# Patient Record
Sex: Female | Born: 1995 | Race: White | Hispanic: No | Marital: Single | Smoking: Current some day smoker
Health system: Southern US, Community
[De-identification: ages and names within clinical notes are randomized; demographics above are authoritative.]

## PROBLEM LIST (undated history)

## (undated) ENCOUNTER — Inpatient Hospital Stay: Payer: Self-pay

## (undated) DIAGNOSIS — Z789 Other specified health status: Secondary | ICD-10-CM

## (undated) DIAGNOSIS — K529 Noninfective gastroenteritis and colitis, unspecified: Secondary | ICD-10-CM

## (undated) DIAGNOSIS — Z9889 Other specified postprocedural states: Secondary | ICD-10-CM

## (undated) DIAGNOSIS — G43909 Migraine, unspecified, not intractable, without status migrainosus: Secondary | ICD-10-CM

## (undated) DIAGNOSIS — N809 Endometriosis, unspecified: Secondary | ICD-10-CM

## (undated) DIAGNOSIS — R87629 Unspecified abnormal cytological findings in specimens from vagina: Secondary | ICD-10-CM

## (undated) HISTORY — DX: Unspecified abnormal cytological findings in specimens from vagina: R87.629

## (undated) HISTORY — DX: Other specified postprocedural states: Z98.890

## (undated) HISTORY — DX: Other specified health status: Z78.9

## (undated) HISTORY — DX: Migraine, unspecified, not intractable, without status migrainosus: G43.909

## (undated) HISTORY — PX: WISDOM TOOTH EXTRACTION: SHX21

## (undated) HISTORY — PX: NO PAST SURGERIES: SHX2092

---
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
## 2013-03-07 ENCOUNTER — Emergency Department: Payer: Self-pay | Admitting: Emergency Medicine

## 2013-03-07 LAB — CBC
HCT: 43.1 % (ref 35.0–47.0)
HGB: 14.8 g/dL (ref 12.0–16.0)
MCH: 30.6 pg (ref 26.0–34.0)
MCHC: 34.4 g/dL (ref 32.0–36.0)
MCV: 89 fL (ref 80–100)
Platelet: 198 10*3/uL (ref 150–440)
RBC: 4.84 10*6/uL (ref 3.80–5.20)
RDW: 13 % (ref 11.5–14.5)
WBC: 10.8 10*3/uL (ref 3.6–11.0)

## 2013-03-07 LAB — URINALYSIS, COMPLETE
Bilirubin,UR: NEGATIVE
Glucose,UR: NEGATIVE mg/dL (ref 0–75)
Ketone: NEGATIVE
Nitrite: NEGATIVE
Ph: 6 (ref 4.5–8.0)
Protein: NEGATIVE
RBC,UR: 4 /HPF (ref 0–5)
Specific Gravity: 1.018 (ref 1.003–1.030)
Squamous Epithelial: 27
WBC UR: 65 /HPF (ref 0–5)

## 2013-03-07 LAB — COMPREHENSIVE METABOLIC PANEL
Albumin: 4.1 g/dL (ref 3.8–5.6)
Alkaline Phosphatase: 93 U/L (ref 82–169)
Anion Gap: 6 — ABNORMAL LOW (ref 7–16)
BUN: 7 mg/dL (ref 7–18)
Bilirubin,Total: 0.8 mg/dL (ref 0.2–1.0)
Calcium, Total: 9.3 mg/dL (ref 9.0–10.7)
Chloride: 105 mmol/L (ref 98–107)
Co2: 27 mmol/L (ref 21–32)
Creatinine: 0.54 mg/dL — ABNORMAL LOW (ref 0.60–1.30)
EGFR (African American): >60
EGFR (Non-African Amer.): >60
Glucose: 69 mg/dL (ref 65–99)
Osmolality: 272 (ref 275–301)
Potassium: 3.9 mmol/L (ref 3.5–5.1)
SGOT(AST): 19 U/L (ref 0–26)
SGPT (ALT): 14 U/L (ref 12–78)
Sodium: 138 mmol/L (ref 136–145)
Total Protein: 8.2 g/dL (ref 6.4–8.6)

## 2013-03-07 LAB — WET PREP, GENITAL

## 2013-03-07 LAB — GC/CHLAMYDIA PROBE AMP

## 2013-03-10 LAB — URINE CULTURE

## 2013-04-11 ENCOUNTER — Emergency Department: Payer: Self-pay | Admitting: Internal Medicine

## 2013-10-31 ENCOUNTER — Emergency Department: Payer: Self-pay | Admitting: Emergency Medicine

## 2013-12-05 ENCOUNTER — Emergency Department: Payer: Self-pay

## 2013-12-05 LAB — URINALYSIS, COMPLETE
Bacteria: NONE SEEN
Bilirubin,UR: NEGATIVE
Blood: NEGATIVE
Glucose,UR: NEGATIVE mg/dL (ref 0–75)
Ketone: NEGATIVE
Leukocyte Esterase: NEGATIVE
Nitrite: NEGATIVE
Ph: 6 (ref 4.5–8.0)
Protein: NEGATIVE
RBC,UR: 1 /HPF (ref 0–5)
Specific Gravity: 1.015 (ref 1.003–1.030)
Squamous Epithelial: 6
WBC UR: 1 /HPF (ref 0–5)

## 2013-12-05 LAB — CBC
HCT: 42.1 % (ref 35.0–47.0)
HGB: 14.1 g/dL (ref 12.0–16.0)
MCH: 29.7 pg (ref 26.0–34.0)
MCHC: 33.4 g/dL (ref 32.0–36.0)
MCV: 89 fL (ref 80–100)
Platelet: 168 10*3/uL (ref 150–440)
RBC: 4.73 10*6/uL (ref 3.80–5.20)
RDW: 13.4 % (ref 11.5–14.5)
WBC: 4.6 10*3/uL (ref 3.6–11.0)

## 2013-12-05 LAB — PREGNANCY, URINE: Pregnancy Test, Urine: NEGATIVE m[IU]/mL

## 2013-12-05 LAB — GC/CHLAMYDIA PROBE AMP

## 2013-12-05 LAB — WET PREP, GENITAL

## 2013-12-13 ENCOUNTER — Emergency Department: Payer: Self-pay | Admitting: Emergency Medicine

## 2014-04-25 ENCOUNTER — Emergency Department: Payer: Self-pay | Admitting: Emergency Medicine

## 2014-04-28 LAB — BETA STREP CULTURE(ARMC)

## 2014-07-18 ENCOUNTER — Emergency Department: Payer: Self-pay | Admitting: Emergency Medicine

## 2014-07-18 LAB — URINALYSIS, COMPLETE
Bacteria: NONE SEEN
Bilirubin,UR: NEGATIVE
Blood: NEGATIVE
Glucose,UR: NEGATIVE mg/dL (ref 0–75)
Ketone: NEGATIVE
Nitrite: NEGATIVE
Ph: 7 (ref 4.5–8.0)
Protein: NEGATIVE
RBC,UR: 4 /HPF (ref 0–5)
Specific Gravity: 1.017 (ref 1.003–1.030)
Squamous Epithelial: 48
WBC UR: 13 /HPF (ref 0–5)

## 2014-07-18 LAB — CBC WITH DIFFERENTIAL/PLATELET
Basophil #: 0 10*3/uL (ref 0.0–0.1)
Basophil %: 0.8 %
Eosinophil #: 0.3 10*3/uL (ref 0.0–0.7)
Eosinophil %: 5.2 %
HCT: 41.6 % (ref 35.0–47.0)
HGB: 13.3 g/dL (ref 12.0–16.0)
Lymphocyte #: 1.9 10*3/uL (ref 1.0–3.6)
Lymphocyte %: 37.5 %
MCH: 29.1 pg (ref 26.0–34.0)
MCHC: 32 g/dL (ref 32.0–36.0)
MCV: 91 fL (ref 80–100)
Monocyte #: 0.4 x10 3/mm (ref 0.2–0.9)
Monocyte %: 8.6 %
Neutrophil #: 2.5 10*3/uL (ref 1.4–6.5)
Neutrophil %: 47.9 %
Platelet: 175 10*3/uL (ref 150–440)
RBC: 4.58 10*6/uL (ref 3.80–5.20)
RDW: 13.1 % (ref 11.5–14.5)
WBC: 5.1 10*3/uL (ref 3.6–11.0)

## 2014-07-18 LAB — COMPREHENSIVE METABOLIC PANEL
Albumin: 3.9 g/dL (ref 3.4–5.0)
Alkaline Phosphatase: 62 U/L
Anion Gap: 7 (ref 7–16)
BUN: 7 mg/dL (ref 7–18)
Bilirubin,Total: 0.3 mg/dL (ref 0.2–1.0)
Calcium, Total: 8.4 mg/dL — ABNORMAL LOW (ref 8.5–10.1)
Chloride: 108 mmol/L — ABNORMAL HIGH (ref 98–107)
Co2: 27 mmol/L (ref 21–32)
Creatinine: 0.74 mg/dL (ref 0.60–1.30)
EGFR (African American): >60
EGFR (Non-African Amer.): >60
Glucose: 95 mg/dL (ref 65–99)
Osmolality: 281 (ref 275–301)
Potassium: 3.8 mmol/L (ref 3.5–5.1)
SGOT(AST): 16 U/L (ref 15–37)
SGPT (ALT): 14 U/L
Sodium: 142 mmol/L (ref 136–145)
Total Protein: 6.8 g/dL (ref 6.4–8.2)

## 2014-07-18 LAB — GC/CHLAMYDIA PROBE AMP

## 2014-07-18 LAB — LIPASE, BLOOD: Lipase: 90 U/L (ref 73–393)

## 2014-07-18 LAB — WET PREP, GENITAL

## 2014-12-10 ENCOUNTER — Emergency Department: Payer: Self-pay | Admitting: Emergency Medicine

## 2015-01-27 ENCOUNTER — Emergency Department
Admit: 2015-01-27 | Disposition: A | Discharge status: Home / Self Care | Payer: Self-pay | Admitting: Emergency Medicine

## 2015-01-27 LAB — CBC
HCT: 44 % (ref 35.0–47.0)
HGB: 14.5 g/dL (ref 12.0–16.0)
MCH: 29.3 pg (ref 26.0–34.0)
MCHC: 32.8 g/dL (ref 32.0–36.0)
MCV: 89 fL (ref 80–100)
Platelet: 207 10*3/uL (ref 150–440)
RBC: 4.93 10*6/uL (ref 3.80–5.20)
RDW: 13.2 % (ref 11.5–14.5)
WBC: 5.5 10*3/uL (ref 3.6–11.0)

## 2015-05-15 LAB — HM PAP SMEAR: HM Pap smear: NEGATIVE

## 2015-06-29 ENCOUNTER — Encounter: Payer: Self-pay | Admitting: Emergency Medicine

## 2015-06-29 ENCOUNTER — Emergency Department
Admission: EM | Admit: 2015-06-29 | Discharge: 2015-06-29 | Discharge status: Against Medical Advice | Payer: Medicaid Other | Attending: Emergency Medicine | Admitting: Emergency Medicine

## 2015-06-29 DIAGNOSIS — Z720 Tobacco use: Secondary | ICD-10-CM | POA: Insufficient documentation

## 2015-06-29 DIAGNOSIS — N390 Urinary tract infection, site not specified: Secondary | ICD-10-CM | POA: Insufficient documentation

## 2015-06-29 DIAGNOSIS — M79605 Pain in left leg: Secondary | ICD-10-CM | POA: Diagnosis not present

## 2015-06-29 DIAGNOSIS — R102 Pelvic and perineal pain: Secondary | ICD-10-CM | POA: Diagnosis present

## 2015-06-29 LAB — URINALYSIS COMPLETE WITH MICROSCOPIC (ARMC ONLY)
Bacteria, UA: NONE SEEN
Bilirubin Urine: NEGATIVE
Glucose, UA: NEGATIVE mg/dL
Hgb urine dipstick: NEGATIVE
Leukocytes, UA: NEGATIVE
Nitrite: NEGATIVE
Protein, ur: NEGATIVE mg/dL
Specific Gravity, Urine: 1.003 — ABNORMAL LOW (ref 1.005–1.030)
Squamous Epithelial / LPF: NONE SEEN
pH: 6 (ref 5.0–8.0)

## 2015-06-29 NOTE — ED Notes (Addendum)
Pt presents to ED with pelvic pain. Seen by urgent care yesterday and was dx with a UTI. Pt was prescribed antibiotics with no relief after 2 doses. Pain radiates from lower abd down her left leg.

## 2015-09-25 LAB — OB RESULTS CONSOLE RPR: RPR: NONREACTIVE

## 2015-09-25 LAB — OB RESULTS CONSOLE HIV ANTIBODY (ROUTINE TESTING): HIV: NONREACTIVE

## 2015-09-25 LAB — OB RESULTS CONSOLE RUBELLA ANTIBODY, IGM: Rubella: NON-IMMUNE/NOT IMMUNE

## 2015-09-25 LAB — OB RESULTS CONSOLE HEPATITIS B SURFACE ANTIGEN: Hepatitis B Surface Ag: NEGATIVE

## 2015-09-25 LAB — OB RESULTS CONSOLE VARICELLA ZOSTER ANTIBODY, IGG: Varicella: IMMUNE

## 2015-09-26 ENCOUNTER — Ambulatory Visit
Admission: RE | Admit: 2015-09-26 | Discharge: 2015-09-26 | Disposition: A | Discharge status: Home / Self Care | Payer: Medicaid Other | Source: Ambulatory Visit | Attending: Family Medicine | Admitting: Family Medicine

## 2015-09-26 DIAGNOSIS — Z3A00 Weeks of gestation of pregnancy not specified: Secondary | ICD-10-CM | POA: Diagnosis not present

## 2015-09-26 DIAGNOSIS — O219 Vomiting of pregnancy, unspecified: Secondary | ICD-10-CM | POA: Insufficient documentation

## 2015-09-26 MED ORDER — LACTATED RINGERS IV SOLN
Freq: Once | INTRAVENOUS | Status: AC
  Administered 2015-09-26: 1000 mL via INTRAVENOUS
  Administered 2015-09-26: 10:00:00 via INTRAVENOUS

## 2015-09-26 NOTE — OR Nursing (Signed)
Infusion complete. Patient ambulated to BR and voided moderate amt. Iv discontinued and discharged to home.

## 2015-09-26 NOTE — OR Nursing (Signed)
Patient instructed to follow up with with Dr. Vergie LivingPickens for any problems.

## 2015-10-22 NOTE — L&D Delivery Note (Signed)
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 Artis Flock(Stefanie Sparks) Apgars:8 ,9  Weight:  pending .   Placenta status: spontaneous and Intact.  Cord: 3+ vessels;  with the following complications: none.  Anesthesia:  epidural Episiotomy:  none Lacerations:  none Est. Blood Loss (mL):  less than 100 mL  Mom to postpartum.  Baby to Couplet care / Skin to Skin.

## 2016-04-06 ENCOUNTER — Observation Stay
Admission: EM | Admit: 2016-04-06 | Discharge: 2016-04-06 | Disposition: A | Discharge status: Home / Self Care | Payer: Medicaid Other | Attending: Obstetrics & Gynecology | Admitting: Obstetrics & Gynecology

## 2016-04-06 DIAGNOSIS — O99613 Diseases of the digestive system complicating pregnancy, third trimester: Principal | ICD-10-CM | POA: Insufficient documentation

## 2016-04-06 DIAGNOSIS — K529 Noninfective gastroenteritis and colitis, unspecified: Secondary | ICD-10-CM

## 2016-04-06 DIAGNOSIS — Z3A35 35 weeks gestation of pregnancy: Secondary | ICD-10-CM | POA: Diagnosis not present

## 2016-04-06 DIAGNOSIS — O9989 Other specified diseases and conditions complicating pregnancy, childbirth and the puerperium: Secondary | ICD-10-CM | POA: Diagnosis present

## 2016-04-06 HISTORY — DX: Noninfective gastroenteritis and colitis, unspecified: K52.9

## 2016-04-06 LAB — URINALYSIS COMPLETE WITH MICROSCOPIC (ARMC ONLY)
Bilirubin Urine: NEGATIVE
Glucose, UA: NEGATIVE mg/dL
Hgb urine dipstick: NEGATIVE
Ketones, ur: NEGATIVE mg/dL
Nitrite: NEGATIVE
Protein, ur: NEGATIVE mg/dL
Specific Gravity, Urine: 1.005 (ref 1.005–1.030)
pH: 8 (ref 5.0–8.0)

## 2016-04-06 MED ORDER — ACETAMINOPHEN 325 MG PO TABS: 650.0000 mg | ORAL_TABLET | ORAL | Status: DC | PRN

## 2016-04-06 MED ORDER — ONDANSETRON HCL 4 MG/2ML IJ SOLN
4.0000 mg | Freq: Four times a day (QID) | INTRAMUSCULAR | Status: DC | PRN
  Administered 2016-04-06: 4 mg via INTRAVENOUS
  Filled 2016-04-06: qty 2

## 2016-04-06 MED ORDER — DEXTROSE 5 % IN LACTATED RINGERS IV BOLUS
1000.0000 mL | Freq: Once | INTRAVENOUS | Status: AC
  Administered 2016-04-06: 1000 mL via INTRAVENOUS

## 2016-04-06 MED ORDER — DEXTROSE IN LACTATED RINGERS 5 % IV SOLN
INTRAVENOUS | Status: DC
  Administered 2016-04-06: 15:00:00 via INTRAVENOUS

## 2016-04-06 MED ORDER — LOPERAMIDE HCL 2 MG PO CAPS
2.0000 mg | ORAL_CAPSULE | ORAL | Status: DC | PRN
  Administered 2016-04-06: 2 mg via ORAL
  Filled 2016-04-06: qty 1

## 2016-04-06 NOTE — Discharge Instructions (Signed)

## 2016-04-06 NOTE — OB Triage Note (Signed)
Ms. Andrey CampanileWilson here with c/o back pain that started at 0400, continues with pelvic pain and back/hip pain, abdomen soft to palpation, + fetal movement, denies bleeding, LOF. Pt also c/o nausea, vomiting >10x since 0400, reports 4x diarrhea since 0800.

## 2016-04-07 NOTE — Discharge Summary (Addendum)
Physician Discharge Summary  Patient ID: Stefanie Sparks MRN: [id number] DOB/AGE: Oct 24, 1995 20 y.o.  Admit date: 04/06/2016 Discharge date: 04/07/2016  Admission Diagnoses: Nausea, Diarrhea, Pregnant  Discharge Diagnoses:  Active Problems:   Gastroenteritis  Discharged Condition: good  Hospital Course: IVF Hydration and Fetal Monitoring  Consults: None  Significant Diagnostic Studies: A NST procedure was performed with FHR monitoring and a normal baseline established, appropriate time of 20-40 minutes of evaluation, and accels >2 seen w 15x15 characteristics.  Results show a REACTIVE NST.   Treatments: IV hydration  Discharge Exam: Blood pressure 116/66, pulse 95, temperature 98 F (36.7 C), temperature source Oral, resp. rate 16, height 4\' 11"  (1.499 m), weight 57.153 kg (126 lb), last menstrual period 08/03/2015. Triage nurse patient  Disposition: 01-Home or Self Care     Medication List    ASK your doctor about these medications        ondansetron 4 MG tablet  Commonly known as:  ZOFRAN  Take 4 mg by mouth 1 day or 1 dose.     PRENATAL VITAMIN PO  Take by mouth.           Follow-up Information    [facility].   Why:  for scheduled appointment      Signed: Letitia Libraobert Paul Zarya Lasseigne 04/07/2016, 1:45 AM

## 2016-04-11 LAB — OB RESULTS CONSOLE GBS: GBS: NEGATIVE

## 2016-04-11 LAB — OB RESULTS CONSOLE GC/CHLAMYDIA
Chlamydia: NEGATIVE
Gonorrhea: NEGATIVE

## 2016-04-20 ENCOUNTER — Observation Stay
Admission: EM | Admit: 2016-04-20 | Discharge: 2016-04-20 | Disposition: A | Discharge status: Home / Self Care | Payer: Medicaid Other | Attending: Obstetrics & Gynecology | Admitting: Obstetrics & Gynecology

## 2016-04-20 ENCOUNTER — Encounter: Payer: Self-pay | Admitting: *Deleted

## 2016-04-20 DIAGNOSIS — Z3A37 37 weeks gestation of pregnancy: Secondary | ICD-10-CM | POA: Insufficient documentation

## 2016-04-20 DIAGNOSIS — O471 False labor at or after 37 completed weeks of gestation: Principal | ICD-10-CM | POA: Insufficient documentation

## 2016-04-20 NOTE — Final Progress Note (Signed)
Physician Final Progress Note  Patient ID: Stefanie DarnerJustice T Lamar MRN: [id number] DOB/AGE: 571-13-1997 20 y.o.  Admit date: 04/20/2016 Admitting provider: Nadara Mustardobert P Harris, MD Discharge date: 04/20/2016   Admission Diagnoses: IUP at 37 wk 2 d Contractions  Discharge Diagnoses:  Same Consults: none  Significant Findings/ Diagnostic Studies: 20 year old G2 P1001 with EDC=05/09/2016 presented this Am at 37 wk 2d with with irregular contractions. She was monitored/ observed for 6 hours over which time the contractions spaced out and she was able to sleep. Her cervix remained unchanged at 3-4 cm/60-70%/ -1 to -2. FHR was reactive with baseline 120s and accelerations to 150s. GBS negative. Having no leakage of fluid or bleeding. Reviewed prenatal records. Patient discharged home with labor precautions.   Procedures: none  Discharge Condition: stable  Disposition: home Diet: Regular diet  Discharge Activity: Activity as tolerated  [facility] at 1430      Total time spent taking care of this patient: 15 minutes  Signed: Farrel ConnersGUTIERREZ, Lajoyce Tamura 04/20/2016, 8:33 AM

## 2016-04-22 ENCOUNTER — Observation Stay
Admission: EM | Admit: 2016-04-22 | Discharge: 2016-04-22 | Disposition: A | Discharge status: Home / Self Care | Payer: Medicaid Other | Source: Home / Self Care | Admitting: Obstetrics & Gynecology

## 2016-04-22 DIAGNOSIS — O471 False labor at or after 37 completed weeks of gestation: Secondary | ICD-10-CM | POA: Insufficient documentation

## 2016-04-22 DIAGNOSIS — F17210 Nicotine dependence, cigarettes, uncomplicated: Secondary | ICD-10-CM

## 2016-04-22 DIAGNOSIS — Z3A37 37 weeks gestation of pregnancy: Secondary | ICD-10-CM | POA: Insufficient documentation

## 2016-04-22 DIAGNOSIS — O99333 Smoking (tobacco) complicating pregnancy, third trimester: Secondary | ICD-10-CM | POA: Insufficient documentation

## 2016-04-22 NOTE — OB Triage Note (Signed)
Pt arrived to OBS rm 4 with c/o possible SROM at 1330. Pt placed on monitors and oriented to room.

## 2016-04-22 NOTE — Discharge Instructions (Signed)
Return to hospital for decreased fetal movement, leaking of fluid, vaginal bleeding, or painful contractions.

## 2016-04-22 NOTE — Discharge Summary (Signed)
Obstetric History and Physical  Stefanie Sparks is a 20 y.o. G2P1001 with Estimated Date of Delivery: 05/09/16 who presents at 7263w4d  presenting for leaking fluid and ctx. No vaginal bleeding, with active fetal movement.    Prenatal Course Source of Care: [facility]   Pregnancy complications or risks: Patient Active Problem List   Diagnosis Date Noted  . Labor and delivery, indication for care 04/22/2016    Prenatal Transfer Tool   History reviewed. No pertinent past medical history.  Past Surgical History  Procedure Laterality Date  . No past surgeries      OB History  Gravida Para Term Preterm AB SAB TAB Ectopic Multiple Living  2 1 1       1     # Outcome Date GA Lbr Len/2nd Weight Sex Delivery Anes PTL Lv  2 Current           1 Term 11/03/10 2565w0d   F Vag-Spont None  Y      Social History   Social History  . Marital Status: Single    Spouse Name: N/A  . Number of Children: N/A  . Years of Education: N/A   Social History Main Topics  . Smoking status: Current Every Day Smoker -- 1.00 packs/day    Types: Cigarettes  . Smokeless tobacco: Never Used  . Alcohol Use: No  . Drug Use: No  . Sexual Activity: Not Asked   Other Topics Concern  . None   Social History Narrative    History reviewed. No pertinent family history.  Prescriptions prior to admission  Medication Sig Dispense Refill Last Dose  . ondansetron (ZOFRAN) 4 MG tablet Take 4 mg by mouth 1 day or 1 dose.   04/21/2016 at Unknown time  . Prenatal Vit-Fe Fumarate-FA (PRENATAL VITAMIN PO) Take by mouth.   04/21/2016 at Unknown time    No Known Allergies  Review of Systems: Negative except for what is mentioned in HPI.  Physical Exam: BP 104/66 mmHg  Pulse 85  Temp(Src) 98.1 F (36.7 C) (Oral)  LMP 08/03/2015 (Approximate) GENERAL: Well-developed, well-nourished female in no acute distress.  ABDOMEN: Soft, nontender, nondistended, gravid. EXTREMITIES: Nontender, no edema Cervical Exam:3-4/90/-1,  unchanged except -2 station on re-exam, nitrizine negative, no evidence of ROM Presentation: cephalic FHT: Category: 1 Baseline rate 130 bpm   Variability moderate  Accelerations present   Decelerations none Contractions: every 2-5 min, pt reports q 6-10 min   Pertinent Labs/Studies:   No results found for this or any previous visit (from the past 24 hour(s)).  Assessment : IUP at 5463w4d not in labor  Plan: Discharge Home  Labor precautions

## 2016-04-22 NOTE — OB Triage Note (Signed)
Marja Kays. Brothers, CNM at bedside for discharge instructions. Pt. Verbalized understanding. All questions answered. Labor precautions given.

## 2016-04-23 ENCOUNTER — Inpatient Hospital Stay: Payer: Medicaid Other | Admitting: Anesthesiology

## 2016-04-23 ENCOUNTER — Inpatient Hospital Stay
Admission: EM | Admit: 2016-04-23 | Discharge: 2016-04-25 | DRG: 775 | Disposition: A | Discharge status: Home / Self Care | Payer: Medicaid Other | Attending: Obstetrics and Gynecology | Admitting: Obstetrics and Gynecology

## 2016-04-23 ENCOUNTER — Encounter: Payer: Self-pay | Admitting: *Deleted

## 2016-04-23 DIAGNOSIS — O99334 Smoking (tobacco) complicating childbirth: Secondary | ICD-10-CM | POA: Diagnosis present

## 2016-04-23 DIAGNOSIS — F1290 Cannabis use, unspecified, uncomplicated: Secondary | ICD-10-CM | POA: Diagnosis present

## 2016-04-23 DIAGNOSIS — Z3A37 37 weeks gestation of pregnancy: Secondary | ICD-10-CM | POA: Diagnosis not present

## 2016-04-23 DIAGNOSIS — O99324 Drug use complicating childbirth: Principal | ICD-10-CM | POA: Diagnosis present

## 2016-04-23 DIAGNOSIS — Z23 Encounter for immunization: Secondary | ICD-10-CM | POA: Diagnosis not present

## 2016-04-23 LAB — TYPE AND SCREEN
ABO/RH(D): O POS
Antibody Screen: NEGATIVE

## 2016-04-23 LAB — CBC
HCT: 33.6 % — ABNORMAL LOW (ref 35.0–47.0)
Hemoglobin: 11.3 g/dL — ABNORMAL LOW (ref 12.0–16.0)
MCH: 29 pg (ref 26.0–34.0)
MCHC: 33.7 g/dL (ref 32.0–36.0)
MCV: 85.9 fL (ref 80.0–100.0)
Platelets: 211 10*3/uL (ref 150–440)
RBC: 3.91 MIL/uL (ref 3.80–5.20)
RDW: 12.8 % (ref 11.5–14.5)
WBC: 13.1 10*3/uL — ABNORMAL HIGH (ref 3.6–11.0)

## 2016-04-23 LAB — RAPID HIV SCREEN (HIV 1/2 AB+AG)
HIV 1/2 Antibodies: NONREACTIVE
HIV-1 P24 Antigen - HIV24: NONREACTIVE

## 2016-04-23 LAB — URINE DRUG SCREEN, QUALITATIVE (ARMC ONLY)
Amphetamines, Ur Screen: NOT DETECTED
Barbiturates, Ur Screen: NOT DETECTED
Benzodiazepine, Ur Scrn: NOT DETECTED
Cannabinoid 50 Ng, Ur ~~LOC~~: POSITIVE — AB
Cocaine Metabolite,Ur ~~LOC~~: NOT DETECTED
MDMA (Ecstasy)Ur Screen: NOT DETECTED
Methadone Scn, Ur: NOT DETECTED
Opiate, Ur Screen: NOT DETECTED
Phencyclidine (PCP) Ur S: NOT DETECTED
Tricyclic, Ur Screen: NOT DETECTED

## 2016-04-23 MED ORDER — NALBUPHINE HCL 10 MG/ML IJ SOLN: 5.0000 mg | Freq: Once | INTRAMUSCULAR | Status: DC | PRN

## 2016-04-23 MED ORDER — TERBUTALINE SULFATE 1 MG/ML IJ SOLN: 0.2500 mg | Freq: Once | INTRAMUSCULAR | Status: DC | PRN

## 2016-04-23 MED ORDER — OXYTOCIN 40 UNITS IN LACTATED RINGERS INFUSION - SIMPLE MED
1.0000 m[IU]/min | INTRAVENOUS | Status: DC
  Administered 2016-04-23: 1 m[IU]/min via INTRAVENOUS

## 2016-04-23 MED ORDER — OXYTOCIN BOLUS FROM INFUSION: 500.0000 mL | INTRAVENOUS | Status: DC

## 2016-04-23 MED ORDER — BUTORPHANOL TARTRATE 1 MG/ML IJ SOLN: 1.0000 mg | INTRAMUSCULAR | Status: DC | PRN

## 2016-04-23 MED ORDER — LIDOCAINE HCL (PF) 1 % IJ SOLN
INTRAMUSCULAR | Status: AC
  Administered 2016-04-23: 3 mL via SUBCUTANEOUS
  Filled 2016-04-23: qty 30

## 2016-04-23 MED ORDER — OXYTOCIN 40 UNITS IN LACTATED RINGERS INFUSION - SIMPLE MED: 2.5000 [IU]/h | INTRAVENOUS | Status: DC

## 2016-04-23 MED ORDER — OXYTOCIN 10 UNIT/ML IJ SOLN
INTRAMUSCULAR | Status: AC
  Filled 2016-04-23: qty 2

## 2016-04-23 MED ORDER — EPHEDRINE 5 MG/ML INJ
10.0000 mg | Freq: Once | INTRAVENOUS | Status: AC
  Administered 2016-04-24: 10 mg via INTRAVENOUS

## 2016-04-23 MED ORDER — DIPHENHYDRAMINE HCL 25 MG PO CAPS: 25.0000 mg | ORAL_CAPSULE | ORAL | Status: DC | PRN

## 2016-04-23 MED ORDER — AMMONIA AROMATIC IN INHA
RESPIRATORY_TRACT | Status: AC
  Filled 2016-04-23: qty 10

## 2016-04-23 MED ORDER — LIDOCAINE-EPINEPHRINE (PF) 1.5 %-1:200000 IJ SOLN
INTRAMUSCULAR | Status: DC | PRN
  Administered 2016-04-23: 4 mL via EPIDURAL

## 2016-04-23 MED ORDER — MISOPROSTOL 200 MCG PO TABS
ORAL_TABLET | ORAL | Status: AC
  Filled 2016-04-23: qty 4

## 2016-04-23 MED ORDER — NALBUPHINE HCL 10 MG/ML IJ SOLN: 5.0000 mg | INTRAMUSCULAR | Status: DC | PRN

## 2016-04-23 MED ORDER — DIPHENHYDRAMINE HCL 50 MG/ML IJ SOLN: 12.5000 mg | INTRAMUSCULAR | Status: DC | PRN

## 2016-04-23 MED ORDER — SODIUM CHLORIDE 0.9% FLUSH: 3.0000 mL | INTRAVENOUS | Status: DC | PRN

## 2016-04-23 MED ORDER — ONDANSETRON HCL 4 MG/2ML IJ SOLN
4.0000 mg | Freq: Four times a day (QID) | INTRAMUSCULAR | Status: DC | PRN
  Administered 2016-04-24: 4 mg via INTRAVENOUS
  Filled 2016-04-23: qty 2

## 2016-04-23 MED ORDER — FENTANYL 2.5 MCG/ML W/ROPIVACAINE 0.2% IN NS 100 ML EPIDURAL INFUSION (ARMC-ANES)
EPIDURAL | Status: AC
  Administered 2016-04-23: 10 mL/h via EPIDURAL
  Filled 2016-04-23: qty 100

## 2016-04-23 MED ORDER — OXYTOCIN 40 UNITS IN LACTATED RINGERS INFUSION - SIMPLE MED
INTRAVENOUS | Status: AC
  Administered 2016-04-23: 1 m[IU]/min via INTRAVENOUS
  Filled 2016-04-23: qty 1000

## 2016-04-23 MED ORDER — NALOXONE HCL 2 MG/2ML IJ SOSY
1.0000 ug/kg/h | PREFILLED_SYRINGE | INTRAVENOUS | Status: DC | PRN
  Filled 2016-04-23: qty 2

## 2016-04-23 MED ORDER — LACTATED RINGERS IV SOLN: 500.0000 mL | INTRAVENOUS | Status: DC | PRN

## 2016-04-23 MED ORDER — ACETAMINOPHEN 325 MG PO TABS
650.0000 mg | ORAL_TABLET | ORAL | Status: DC | PRN
  Administered 2016-04-23: 650 mg via ORAL
  Filled 2016-04-23: qty 2

## 2016-04-23 MED ORDER — FENTANYL 2.5 MCG/ML W/ROPIVACAINE 0.2% IN NS 100 ML EPIDURAL INFUSION (ARMC-ANES)
10.0000 mL/h | EPIDURAL | Status: DC
Start: 2016-04-23 — End: 2016-04-24

## 2016-04-23 MED ORDER — BUPIVACAINE HCL (PF) 0.25 % IJ SOLN
INTRAMUSCULAR | Status: DC | PRN
  Administered 2016-04-23: 5 mL via EPIDURAL

## 2016-04-23 MED ORDER — NALOXONE HCL 0.4 MG/ML IJ SOLN: 0.4000 mg | INTRAMUSCULAR | Status: DC | PRN

## 2016-04-23 MED ORDER — LACTATED RINGERS IV SOLN
INTRAVENOUS | Status: DC
  Administered 2016-04-23: 125 mL/h via INTRAVENOUS
  Administered 2016-04-24: 07:00:00 via INTRAVENOUS

## 2016-04-23 NOTE — Progress Notes (Signed)
Status report given, pt. Rates contractions 6/10, ctx q 2-4 min, cat. 1 fetal tracing. Stable pt. requiring no pain medication at this time.

## 2016-04-23 NOTE — Progress Notes (Signed)
  Labor Progress Note   20 y.o. G2P1001 @ 5055w5d , admitted for  Pregnancy, Labor Management.   Subjective:  Min pain.  Objective:  BP 112/67 mmHg  Pulse 82  Temp(Src) 98.5 F (36.9 C) (Axillary)  Resp 18  Ht 4\' 11"  (1.499 m)  Wt 60.328 kg (133 lb)  BMI 26.85 kg/m2  LMP 08/03/2015 (Approximate) Abd: mild Extr: trace to 1+ bilateral pedal edema SVE: CERVIX: 4 cm dilated, 70 effaced, ballotable station  EFM: FHR: 140 bpm, variability: moderate,  accelerations:  Present,  decelerations:  Absent Toco: Frequency: Every 5-7 minutes  Assessment & Plan:  G2P1001 @ 3055w5d, admitted for  Pregnancy and Labor/Delivery Management  1. Pain management: none. 2. FWB: FHT category 1.  3. ID: GBS negative 4. Labor management: Start Pitocin as she has been 6 hours since SROM without significant cervical change or good ctx pattern  All discussed with patient, see orders

## 2016-04-23 NOTE — Anesthesia Procedure Notes (Signed)
Epidural Patient location during procedure: OB  Preanesthetic Checklist Completed: patient identified, site marked, surgical consent, pre-op evaluation, timeout performed, IV checked, risks and benefits discussed and monitors and equipment checked  Epidural Patient position: sitting Prep: Betadine Patient monitoring: heart rate, continuous pulse ox and blood pressure Approach: midline Location: L4-L5 Injection technique: LOR air  Needle:  Needle type: Tuohy  Needle gauge: 17 G Needle length: 9 cm and 9 Needle insertion depth: 4 cm Catheter type: closed end flexible Catheter size: 20 Guage Catheter at skin depth: 10 cm Test dose: negative and 1.5% lidocaine with Epi 1:200 K  Assessment Sensory level: T10 Events: blood not aspirated, injection not painful, no injection resistance, negative IV test and no paresthesia  Additional Notes Pt's history reviewed and consent obtained as per OB consent Patient tolerated the insertion well without complications. Negative SATD, negative IVTD All VSS were obtained and monitored through OBIX and nursing protocols followed.Reason for block:procedure for pain

## 2016-04-23 NOTE — H&P (Signed)
Obstetrics Admission History & Physical   Rupture of Membranes   HPI:  20 y.o. G2P1001 @ 467w5d (05/09/2016, by Last Menstrual Period). Admitted on 04/23/2016:   Patient Active Problem List   Diagnosis Date Noted  . Labor and delivery, indication for care 04/22/2016  . Indication for care in labor and delivery, delivered 04/20/2016  . Gastroenteritis 04/06/2016     Presents for SROM at 1400 today with mild ctxs before and worsening since that time.  No VB.  Good FM.  [facility]  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 Prior NSVD without complication.  PMHx: History reviewed. No pertinent past medical history. PSHx: None Medications:  Prescriptions prior to admission  Medication Sig Dispense Refill Last Dose  . ondansetron (ZOFRAN) 4 MG tablet Take 4 mg by mouth 1 day or 1 dose.   Past Month  . Prenatal Vit-Fe Fumarate-FA (PRENATAL VITAMIN PO) Take by mouth.   04/22/2016  Allergies: has No Known Allergies. OBHx:  OB History  Gravida Para Term Preterm AB SAB TAB Ectopic Multiple Living  2 1 1       1     # Outcome Date GA Lbr Len/2nd Weight Sex Delivery Anes PTL Lv  2 Current           1 Term 11/03/10 6759w0d   F Vag-Spont None  Y    ZOX:[id number]:Negative/unremarkable except as detailed in HPI. Soc Hx: Pregnancy welcomed  Objective:   Filed Vitals:   04/23/16 1521  BP: 104/75  Pulse: 108  Temp: 98.8 F (37.1 C)  Resp: 18   General: Well nourished, well developed female in no acute distress.  Skin: Warm and dry.  Cardiovascular:Regular rate and rhythm. Respiratory: Clear to auscultation bilateral. Normal respiratory effort Abdomen: moderate Neuro/Psych: Normal mood and affect.   Pelvic exam: is not limited by body habitus EGBUS: within normal limits Vagina: within normal limits and with normal mucosa blood in the vault Cervix: 4/80/-2 Uterus: Spontaneous uterine activity  Adnexa: not evaluated  EFM:FHR: 140 bpm, variability:  moderate,  accelerations:  Present,  decelerations:  Absent Toco: Frequency: Every 5-7 minutes   Perinatal info:  Blood type: O positive Rubella- Not immune Varicella -Immune TDaP Given during third trimester of this pregnancy RPR NR / HIV Neg/ HBsAg Neg   Assessment & Plan:   20 y.o. G2P1001 @ 4867w5d, Admitted on 04/23/2016: SROM, Labor  Admit for labor, Observe for cervical change, Fetal Wellbeing Reassuring and Epidural when ready

## 2016-04-23 NOTE — Anesthesia Preprocedure Evaluation (Signed)
Anesthesia Evaluation  Patient identified by MRN, date of birth, ID band Patient awake    Reviewed: Allergy & Precautions, H&P , NPO status , Patient's Chart, lab work & pertinent test results, reviewed documented beta blocker date and time   Airway Mallampati: III  TM Distance: >3 FB Neck ROM: full    Dental no notable dental hx. (+) Teeth Intact   Pulmonary neg pulmonary ROS, Current Smoker,    Pulmonary exam normal breath sounds clear to auscultation       Cardiovascular Exercise Tolerance: Good negative cardio ROS Normal cardiovascular exam Rhythm:regular Rate:Normal     Neuro/Psych negative neurological ROS  negative psych ROS   GI/Hepatic negative GI ROS, Neg liver ROS,   Endo/Other  negative endocrine ROS  Renal/GU negative Renal ROS  negative genitourinary   Musculoskeletal   Abdominal   Peds  Hematology negative hematology ROS (+)   Anesthesia Other Findings   Reproductive/Obstetrics (+) Pregnancy                             Anesthesia Physical Anesthesia Plan  ASA: II  Anesthesia Plan: Regional and Epidural   Post-op Pain Management:    Induction:   Airway Management Planned:   Additional Equipment:   Intra-op Plan:   Post-operative Plan:   Informed Consent: I have reviewed the patients History and Physical, chart, labs and discussed the procedure including the risks, benefits and alternatives for the proposed anesthesia with the patient or authorized representative who has indicated his/her understanding and acceptance.     Plan Discussed with: CRNA  Anesthesia Plan Comments:         Anesthesia Quick Evaluation

## 2016-04-23 NOTE — Progress Notes (Signed)
RN at the bedside, maternal pulse tracing intermittently, patient sitting in HF with legs crossed, eating clear liquid diet. Once pt. slightly reclines, fetal heart rate re-appears (120s).

## 2016-04-23 NOTE — Progress Notes (Signed)
[facility].  Occasionally when pt. sitting in HF position with legs crossed;  fetal tracing picks up maternal heart rate. FHR 130s - 140s, maternal heart rate 90s.   RN will education patient and continue to monitor. Stable mother and baby.

## 2016-04-23 NOTE — Progress Notes (Signed)
Pt. Present with possible SROM at 2:00 p.m. Today, clear fluid.  Nitrazine test positive, grossly ruptured upon vaginal exam. Dr. Tiburcio PeaHarris paged.

## 2016-04-24 ENCOUNTER — Encounter: Payer: Self-pay | Admitting: *Deleted

## 2016-04-24 MED ORDER — COCONUT OIL OIL: 1.0000 "application " | TOPICAL_OIL | Status: DC | PRN

## 2016-04-24 MED ORDER — IBUPROFEN 600 MG PO TABS
600.0000 mg | ORAL_TABLET | Freq: Four times a day (QID) | ORAL | Status: DC
  Administered 2016-04-24 – 2016-04-25 (×4): 600 mg via ORAL
  Filled 2016-04-24 (×4): qty 1

## 2016-04-24 MED ORDER — ONDANSETRON HCL 4 MG/2ML IJ SOLN: 4.0000 mg | INTRAMUSCULAR | Status: DC | PRN

## 2016-04-24 MED ORDER — BENZOCAINE-MENTHOL 20-0.5 % EX AERO: 1.0000 "application " | INHALATION_SPRAY | CUTANEOUS | Status: DC | PRN

## 2016-04-24 MED ORDER — WITCH HAZEL-GLYCERIN EX PADS
1.0000 | MEDICATED_PAD | CUTANEOUS | Status: DC | PRN
Start: 2016-04-24 — End: 2016-04-25

## 2016-04-24 MED ORDER — ONDANSETRON HCL 4 MG PO TABS
4.0000 mg | ORAL_TABLET | ORAL | Status: DC | PRN
Start: 2016-04-24 — End: 2016-04-25

## 2016-04-24 MED ORDER — EPHEDRINE 5 MG/ML INJ
INTRAVENOUS | Status: AC
  Administered 2016-04-24: 10 mg via INTRAVENOUS
  Filled 2016-04-24: qty 10

## 2016-04-24 MED ORDER — DIBUCAINE 1 % RE OINT: 1.0000 "application " | TOPICAL_OINTMENT | RECTAL | Status: DC | PRN

## 2016-04-24 MED ORDER — SODIUM CHLORIDE 0.9% FLUSH: 3.0000 mL | INTRAVENOUS | Status: DC | PRN

## 2016-04-24 MED ORDER — EPHEDRINE 5 MG/ML INJ
10.0000 mg | Freq: Once | INTRAVENOUS | Status: AC
  Administered 2016-04-24: 10 mg via INTRAVENOUS

## 2016-04-24 MED ORDER — SIMETHICONE 80 MG PO CHEW: 80.0000 mg | CHEWABLE_TABLET | ORAL | Status: DC | PRN

## 2016-04-24 MED ORDER — DIPHENHYDRAMINE HCL 25 MG PO CAPS: 25.0000 mg | ORAL_CAPSULE | Freq: Four times a day (QID) | ORAL | Status: DC | PRN

## 2016-04-24 MED ORDER — ZOLPIDEM TARTRATE 5 MG PO TABS: 5.0000 mg | ORAL_TABLET | Freq: Every evening | ORAL | Status: DC | PRN

## 2016-04-24 MED ORDER — MEASLES, MUMPS & RUBELLA VAC ~~LOC~~ INJ
0.5000 mL | INJECTION | Freq: Once | SUBCUTANEOUS | Status: AC
  Administered 2016-04-25: 0.5 mL via SUBCUTANEOUS
  Filled 2016-04-24 (×2): qty 0.5

## 2016-04-24 MED ORDER — SODIUM CHLORIDE 0.9 % IV SOLN: 250.0000 mL | INTRAVENOUS | Status: DC | PRN

## 2016-04-24 MED ORDER — SODIUM CHLORIDE 0.9% FLUSH: 3.0000 mL | Freq: Two times a day (BID) | INTRAVENOUS | Status: DC

## 2016-04-24 MED ORDER — SENNOSIDES-DOCUSATE SODIUM 8.6-50 MG PO TABS
2.0000 | ORAL_TABLET | ORAL | Status: DC
  Administered 2016-04-25: 2 via ORAL
  Filled 2016-04-24: qty 2

## 2016-04-24 MED ORDER — ACETAMINOPHEN 325 MG PO TABS: 650.0000 mg | ORAL_TABLET | ORAL | Status: DC | PRN

## 2016-04-24 NOTE — Discharge Instructions (Signed)

## 2016-04-24 NOTE — Progress Notes (Signed)
  Labor Progress Note   20 y.o. G2P1001 @ 6018w5d , admitted for  Pregnancy, Labor Management.   Subjective:  Min pain w epidural. Pressure.  Objective:  BP 109/71 mmHg  Pulse 97  Temp(Src) 98.1 F (36.7 C) (Oral)  Resp 18  Ht 4\' 11"  (1.499 m)  Wt 60.328 kg (133 lb)  BMI 26.85 kg/m2  SpO2 98%  LMP 08/03/2015 (Approximate) Abd: mild Extr: trace to 1+ bilateral pedal edema SVE: CERVIX: 9 cm dilated, 100 effaced, 0 station  EFM: FHR: 140 bpm, variability: moderate,  accelerations:  Present,  decelerations:  Absent Toco: Frequency: Every 2 minutes on Pitocin 20 mU/min  Assessment & Plan:  G2P1001 @ 332w6d, admitted for  Pregnancy and Labor/Delivery Management, SROM, Labor  1. Pain management: epidural. 2. FWB: FHT category 1.  3. ID: GBS negative 4. Labor management: Anticipate second stage soon  All discussed with patient, see orders

## 2016-04-24 NOTE — Discharge Summary (Addendum)
Obstetrical Discharge Summary  Date of Admission: 04/23/2016 Date of Discharge: 04/25/2016   Discharge Diagnosis: Term Pregnancy-delivered Primary OB:  [facility]   Gestational Age at Delivery: 503w6d  Antepartum complications: Substance use (MJ) Date of Delivery: 04/25/2016  Delivered By: Annamarie MajorPaul Harris, MD Delivery Type: spontaneous vaginal delivery Intrapartum complications/course: None Anesthesia: epidural Placenta: spontaneous Laceration: none Episiotomy: none Live born female  Birth Weight:  7 lb 1.6 oz (3220 g) APGAR: 8, 9   Post partum course: Since the delivery, patient has tolerate activity, diet, and daily functions without difficulty or complication.  Min lochia.  No breast concerns at this time.  No signs of depression currently.   Postpartum Exam: GEN: NAD, comfortable resting, bottle feeding CV: RRR PULM: nl effort, CTABL ABD: soft, non-tender, non-distended, fundus firm and below umbilicus EXT: no edema, no tenderness bilaterally  Disposition: home with infant Rh Immune globulin given: no Rubella vaccine given: yes Varicella vaccine given: no Tdap vaccine given in AP or PP setting: given during prenatal care Flu vaccine given in AP or PP setting: given during prenatal care Contraception: IUD -- depo provera given at discharge  Prenatal Labs: O POS//Rubella Not immune//RPR negative//HIV negative/HepB Surface Ag negative//plans to breastfeed  Plan:  Stefanie Sparks was discharged to home in good condition. [facility]  Discharge Medications:   Medication List    TAKE these medications        acetaminophen 325 MG tablet  Commonly known as:  TYLENOL  Take 2 tablets (650 mg total) by mouth every 4 (four) hours as needed (for pain scale < 4).     ibuprofen 600 MG tablet  Commonly known as:  ADVIL,MOTRIN  Take 1 tablet (600 mg total) by mouth every 6 (six) hours.     medroxyPROGESTERone 150 MG/ML injection  Commonly  known as:  DEPO-PROVERA  Inject 1 mL (150 mg total) into the muscle every 3 (three) months.     ondansetron 4 MG tablet  Commonly known as:  ZOFRAN  Take 4 mg by mouth 1 day or 1 dose.     PRENATAL VITAMIN PO  Take by mouth.        Follow-up arrangements:  Follow-up Information    Follow up with Letitia Libraobert Paul Harris, MD. Schedule an appointment as soon as possible for a visit in 6 weeks.   Specialty:  Obstetrics and Gynecology   Why:  For Post Partum Check   Contact information:   [address] [place] [address] [phone]         ----- Ranae Plumberhelsea Lindley Stachnik, MD Attending Obstetrician and Gynecologist [facility]

## 2016-04-25 LAB — CBC
HCT: 29.5 % — ABNORMAL LOW (ref 35.0–47.0)
Hemoglobin: 10.2 g/dL — ABNORMAL LOW (ref 12.0–16.0)
MCH: 29.9 pg (ref 26.0–34.0)
MCHC: 34.7 g/dL (ref 32.0–36.0)
MCV: 86.2 fL (ref 80.0–100.0)
Platelets: 201 10*3/uL (ref 150–440)
RBC: 3.43 MIL/uL — ABNORMAL LOW (ref 3.80–5.20)
RDW: 12.6 % (ref 11.5–14.5)
WBC: 10.6 10*3/uL (ref 3.6–11.0)

## 2016-04-25 LAB — RPR: RPR Ser Ql: NONREACTIVE

## 2016-04-25 MED ORDER — ACETAMINOPHEN 325 MG PO TABS: 650.0000 mg | ORAL_TABLET | ORAL | Status: DC | PRN

## 2016-04-25 MED ORDER — IBUPROFEN 600 MG PO TABS: 600.0000 mg | ORAL_TABLET | Freq: Four times a day (QID) | ORAL | Status: DC

## 2016-04-25 MED ORDER — MEDROXYPROGESTERONE ACETATE 150 MG/ML IM SUSP
150.0000 mg | Freq: Once | INTRAMUSCULAR | Status: AC
  Administered 2016-04-25: 150 mg via INTRAMUSCULAR
  Filled 2016-04-25: qty 1

## 2016-04-25 MED ORDER — MEDROXYPROGESTERONE ACETATE 150 MG/ML IM SUSP: 150.0000 mg | INTRAMUSCULAR | Status: DC

## 2016-04-25 NOTE — Anesthesia Postprocedure Evaluation (Signed)
Anesthesia Post Note  Patient: Stefanie Sparks  Procedure(s) Performed: * No procedures listed *  Patient location during evaluation: Mother Baby Anesthesia Type: Epidural Level of consciousness: awake, awake and alert and oriented Pain management: pain level controlled Vital Signs Assessment: post-procedure vital signs reviewed and stable Respiratory status: spontaneous breathing, nonlabored ventilation and respiratory function stable Cardiovascular status: blood pressure returned to baseline and stable Postop Assessment: no headache, no backache, no signs of nausea or vomiting, patient able to bend at knees and adequate PO intake Anesthetic complications: no    Last Vitals:  Filed Vitals:   04/25/16 0759 04/25/16 1141  BP: 112/68   Pulse: 70   Temp: 36.8 C 37.2 C  Resp: 18     Last Pain:  Filed Vitals:   04/25/16 1141  PainSc: 0-No pain                 Ginger CarneStephanie Rajah Lamba

## 2016-12-04 ENCOUNTER — Encounter: Payer: Self-pay | Admitting: *Deleted

## 2016-12-04 ENCOUNTER — Emergency Department
Admission: EM | Admit: 2016-12-04 | Discharge: 2016-12-05 | Disposition: A | Discharge status: Home / Self Care | Payer: Medicaid Other | Attending: Emergency Medicine | Admitting: Emergency Medicine

## 2016-12-04 DIAGNOSIS — R1110 Vomiting, unspecified: Secondary | ICD-10-CM | POA: Diagnosis not present

## 2016-12-04 DIAGNOSIS — Z5321 Procedure and treatment not carried out due to patient leaving prior to being seen by health care provider: Secondary | ICD-10-CM | POA: Insufficient documentation

## 2016-12-04 DIAGNOSIS — F17210 Nicotine dependence, cigarettes, uncomplicated: Secondary | ICD-10-CM | POA: Insufficient documentation

## 2016-12-04 DIAGNOSIS — R300 Dysuria: Secondary | ICD-10-CM | POA: Insufficient documentation

## 2016-12-04 DIAGNOSIS — R509 Fever, unspecified: Secondary | ICD-10-CM | POA: Diagnosis not present

## 2016-12-04 DIAGNOSIS — N939 Abnormal uterine and vaginal bleeding, unspecified: Secondary | ICD-10-CM | POA: Diagnosis not present

## 2016-12-04 DIAGNOSIS — R1084 Generalized abdominal pain: Secondary | ICD-10-CM | POA: Insufficient documentation

## 2016-12-04 DIAGNOSIS — Z79899 Other long term (current) drug therapy: Secondary | ICD-10-CM | POA: Insufficient documentation

## 2016-12-04 DIAGNOSIS — F1290 Cannabis use, unspecified, uncomplicated: Secondary | ICD-10-CM | POA: Diagnosis not present

## 2016-12-04 LAB — POCT PREGNANCY, URINE: Preg Test, Ur: NEGATIVE

## 2016-12-04 LAB — CBC
HCT: 42 % (ref 35.0–47.0)
Hemoglobin: 14.2 g/dL (ref 12.0–16.0)
MCH: 29.2 pg (ref 26.0–34.0)
MCHC: 33.9 g/dL (ref 32.0–36.0)
MCV: 86.1 fL (ref 80.0–100.0)
Platelets: 204 10*3/uL (ref 150–440)
RBC: 4.88 MIL/uL (ref 3.80–5.20)
RDW: 13.5 % (ref 11.5–14.5)
WBC: 8.6 10*3/uL (ref 3.6–11.0)

## 2016-12-04 LAB — COMPREHENSIVE METABOLIC PANEL
ALT: 11 U/L — ABNORMAL LOW (ref 14–54)
AST: 16 U/L (ref 15–41)
Albumin: 4.4 g/dL (ref 3.5–5.0)
Alkaline Phosphatase: 64 U/L (ref 38–126)
Anion gap: 7 (ref 5–15)
BUN: 9 mg/dL (ref 6–20)
CO2: 24 mmol/L (ref 22–32)
Calcium: 8.8 mg/dL — ABNORMAL LOW (ref 8.9–10.3)
Chloride: 106 mmol/L (ref 101–111)
Creatinine, Ser: 0.65 mg/dL (ref 0.44–1.00)
GFR calc Af Amer: >60 mL/min (ref >60)
GFR calc non Af Amer: >60 mL/min (ref >60)
Glucose, Bld: 101 mg/dL — ABNORMAL HIGH (ref 65–99)
Potassium: 3.9 mmol/L (ref 3.5–5.1)
Sodium: 137 mmol/L (ref 135–145)
Total Bilirubin: 0.8 mg/dL (ref 0.3–1.2)
Total Protein: 7.3 g/dL (ref 6.5–8.1)

## 2016-12-04 LAB — URINALYSIS, COMPLETE (UACMP) WITH MICROSCOPIC
Bacteria, UA: NONE SEEN
Bilirubin Urine: NEGATIVE
Glucose, UA: NEGATIVE mg/dL
Hgb urine dipstick: NEGATIVE
Ketones, ur: 20 mg/dL — AB
Leukocytes, UA: NEGATIVE
Nitrite: NEGATIVE
Protein, ur: NEGATIVE mg/dL
Specific Gravity, Urine: 1.023 (ref 1.005–1.030)
pH: 5 (ref 5.0–8.0)

## 2016-12-04 LAB — LIPASE, BLOOD: Lipase: 19 U/L (ref 11–51)

## 2016-12-04 NOTE — ED Triage Notes (Signed)
Pt has abd pain with cramping.  Pt states vomiting today.  Diarrhea today.  Pt reports vag bleeding since 7/17 after delivery of baby.  Pt also reports dysuria.  Pt alert

## 2016-12-05 NOTE — ED Notes (Signed)
No answer when called several times from lobby 

## 2017-01-04 ENCOUNTER — Emergency Department
Admission: EM | Admit: 2017-01-04 | Discharge: 2017-01-04 | Disposition: A | Discharge status: Home / Self Care | Payer: Medicaid Other | Attending: Emergency Medicine | Admitting: Emergency Medicine

## 2017-01-04 DIAGNOSIS — F17210 Nicotine dependence, cigarettes, uncomplicated: Secondary | ICD-10-CM | POA: Diagnosis not present

## 2017-01-04 DIAGNOSIS — F1290 Cannabis use, unspecified, uncomplicated: Secondary | ICD-10-CM | POA: Insufficient documentation

## 2017-01-04 DIAGNOSIS — N764 Abscess of vulva: Secondary | ICD-10-CM | POA: Diagnosis not present

## 2017-01-04 DIAGNOSIS — Z79899 Other long term (current) drug therapy: Secondary | ICD-10-CM | POA: Insufficient documentation

## 2017-01-04 DIAGNOSIS — R2242 Localized swelling, mass and lump, left lower limb: Secondary | ICD-10-CM | POA: Diagnosis present

## 2017-01-04 MED ORDER — HYDROCODONE-ACETAMINOPHEN 5-325 MG PO TABS: 1.0000 | ORAL_TABLET | ORAL | 0 refills | Status: DC | PRN

## 2017-01-04 MED ORDER — SULFAMETHOXAZOLE-TRIMETHOPRIM 800-160 MG PO TABS: 1.0000 | ORAL_TABLET | Freq: Two times a day (BID) | ORAL | 0 refills | Status: DC

## 2017-01-04 MED ORDER — MIDAZOLAM HCL 2 MG/ML PO SYRP
2.0000 mg | ORAL_SOLUTION | Freq: Once | ORAL | Status: AC
  Administered 2017-01-04: 2 mg via ORAL
  Filled 2017-01-04: qty 4

## 2017-01-04 MED ORDER — SULFAMETHOXAZOLE-TRIMETHOPRIM 800-160 MG PO TABS
1.0000 | ORAL_TABLET | Freq: Once | ORAL | Status: AC
  Administered 2017-01-04: 1 via ORAL
  Filled 2017-01-04: qty 1

## 2017-01-04 MED ORDER — OXYCODONE-ACETAMINOPHEN 5-325 MG PO TABS
1.0000 | ORAL_TABLET | Freq: Once | ORAL | Status: AC
  Administered 2017-01-04: 1 via ORAL
  Filled 2017-01-04: qty 1

## 2017-01-04 MED ORDER — LIDOCAINE HCL (PF) 1 % IJ SOLN
10.0000 mL | Freq: Once | INTRAMUSCULAR | Status: AC
  Administered 2017-01-04: 10 mL
  Filled 2017-01-04: qty 10

## 2017-01-04 MED ORDER — OXYCODONE-ACETAMINOPHEN 5-325 MG PO TABS
1.0000 | ORAL_TABLET | Freq: Once | ORAL | Status: DC
  Filled 2017-01-04: qty 1

## 2017-01-04 MED ORDER — FENTANYL CITRATE (PF) 100 MCG/2ML IJ SOLN
100.0000 ug | Freq: Once | INTRAMUSCULAR | Status: AC
  Administered 2017-01-04: 100 ug via INTRAMUSCULAR
  Filled 2017-01-04: qty 2

## 2017-01-04 NOTE — ED Notes (Signed)

## 2017-01-04 NOTE — ED Triage Notes (Signed)
Pt reports to ED w/ c/o cyst to inner L thigh.  Pt sts that she has had cyst before, but this one worse. Pt A/OX4, resp even and unlabored.  NAD.

## 2017-01-04 NOTE — ED Notes (Addendum)
See triage note. Pt c/o abscess to inner left thigh. Redness and swelling noted to L pubic mons/labia majora and inguinal fold. Pt states she has had recurrent abscesses and has been using epsom salt baths and warm compresses without relief.

## 2017-01-04 NOTE — ED Provider Notes (Signed)
[facility] Emergency Department Provider Note  ____________________________________________  Time seen: Approximately 7:55 PM  I have reviewed the triage vital signs and the nursing notes.   HISTORY  Chief Complaint Cyst    HPI Reed Clent Ridges Vandervort is a 21 y.o. female who presents to emergency department complaining of "cyst" to the left inner thigh. Patient reports that she shaved the bikini region and noticed a red bump forming. Patient reports that over the past several days this has grown in both size, redness, pain. She denies any history of recurrent skin lesions. No history of abscess. Patient reports at this time she has no systemic complaints of fevers or chills, nausea, vomiting, abdominal pain. Patient reports extreme tenderness over lesion. No medications prior to arrival.   History reviewed. No pertinent past medical history.  Patient Active Problem List   Diagnosis Date Noted  . Normal labor and delivery 04/23/2016  . Labor and delivery, indication for care 04/22/2016  . Indication for care in labor and delivery, delivered 04/20/2016  . Gastroenteritis 04/06/2016    Past Surgical History:  Procedure Laterality Date  . NO PAST SURGERIES      Prior to Admission medications   Medication Sig Start Date End Date Taking? Authorizing Provider  acetaminophen (TYLENOL) 325 MG tablet Take 2 tablets (650 mg total) by mouth every 4 (four) hours as needed (for pain scale < 4). 04/25/16   Elenora Fenderhelsea C Ward, MD  HYDROcodone-acetaminophen (NORCO/VICODIN) 5-325 MG tablet Take 1 tablet by mouth every 4 (four) hours as needed for moderate pain. 01/04/17   Delorise RoyalsJonathan D Tulio Facundo, PA-C  ibuprofen (ADVIL,MOTRIN) 600 MG tablet Take 1 tablet (600 mg total) by mouth every 6 (six) hours. 04/25/16   Chelsea C Ward, MD  medroxyPROGESTERone (DEPO-PROVERA) 150 MG/ML injection Inject 1 mL (150 mg total) into the muscle every 3 (three) months. 04/25/16   Chelsea C Ward, MD  ondansetron  (ZOFRAN) 4 MG tablet Take 4 mg by mouth 1 day or 1 dose.    Historical Provider, MD  Prenatal Vit-Fe Fumarate-FA (PRENATAL VITAMIN PO) Take by mouth.    Historical Provider, MD  sulfamethoxazole-trimethoprim (BACTRIM DS,SEPTRA DS) 800-160 MG tablet Take 1 tablet by mouth 2 (two) times daily. 01/04/17   Delorise RoyalsJonathan D Miosotis Wetsel, PA-C    Allergies Patient has no known allergies.  No family history on file.  Social History Social History  Substance Use Topics  . Smoking status: Current Every Day Smoker    Packs/day: 1.00    Types: Cigarettes  . Smokeless tobacco: Never Used  . Alcohol use No     Review of Systems  Constitutional: No fever/chills Cardiovascular: no chest pain. Respiratory: no cough. No SOB. Gastrointestinal: No abdominal pain.  No nausea, no vomiting.  No diarrhea.  No constipation. Genitourinary: Negative for dysuria. No hematuria Musculoskeletal: Negative for musculoskeletal pain. Skin: Positive for "cyst" to the left medial thigh Neurological: Negative for headaches, focal weakness or numbness. 10-point ROS otherwise negative.  ____________________________________________   PHYSICAL EXAM:  VITAL SIGNS: ED Triage Vitals [01/04/17 1814]  Enc Vitals Group     BP 107/75     Pulse Rate 99     Resp 18     Temp 98.5 F (36.9 C)     Temp Source Oral     SpO2 100 %     Weight 120 lb (54.4 kg)     Height 4\' 11"  (1.499 m)     Head Circumference      Peak Flow  Pain Score 10     Pain Loc      Pain Edu?      Excl. in GC?      Constitutional: Alert and oriented. Well appearing and in no acute distress. Eyes: Conjunctivae are normal. PERRL. EOMI. Head: Atraumatic. Neck: No stridor.    Cardiovascular: Normal rate, regular rhythm. Normal S1 and S2.  Good peripheral circulation. Respiratory: Normal respiratory effort without tachypnea or retractions. Lungs CTAB. Good air entry to the bases with no decreased or absent breath sounds. Gastrointestinal: Bowel  sounds 4 quadrants. Soft and nontender to palpation. No guarding or rigidity. No palpable masses. No distention. No CVA tenderness. Musculoskeletal: Full range of motion to all extremities. No gross deformities appreciated. Neurologic:  Normal speech and language. No gross focal neurologic deficits are appreciated.  Skin:  Skin is warm, dry and intact. No rash noted. Psychiatric: Mood and affect are normal. Speech and behavior are normal. Patient exhibits appropriate insight and judgement.   ____________________________________________   LABS (all labs ordered are listed, but only abnormal results are displayed)  Labs Reviewed - No data to display ____________________________________________  EKG   ____________________________________________  RADIOLOGY   No results found.  ____________________________________________    PROCEDURES  Procedure(s) performed:    Marland KitchenMarland KitchenIncision and Drainage Date/Time: 01/04/2017 10:19 PM Performed by: Gala Romney D Authorized by: Gala Romney D   Consent:    Consent obtained:  Verbal   Consent given by:  Patient   Risks discussed:  Bleeding, incomplete drainage and pain Location:    Type:  Abscess   Location:  Anogenital   Anogenital location:  Vulva Pre-procedure details:    Skin preparation:  Betadine Sedation:    Sedation type:  Anxiolysis Anesthesia (see MAR for exact dosages):    Anesthesia method:  Local infiltration   Local anesthetic:  Lidocaine 1% w/o epi Procedure type:    Complexity:  Complex Procedure details:    Needle aspiration: no     Incision types:  Single straight   Incision depth:  Subcutaneous   Scalpel blade:  11   Wound management:  Probed and deloculated, irrigated with saline and extensive cleaning   Drainage:  Purulent and bloody   Drainage amount:  Moderate   Wound treatment:  Wound left open   Packing materials:  None Post-procedure details:    Patient tolerance of procedure:   Tolerated with difficulty Comments:     Patient had left labial abscess. Single straight incision is performed. Patient had significant pain with underlying anxiety and was unable to initially tolerated procedure. Patient was given pain medications and an anxiolytic and was able to complete procedure. Moderate drainage was expressed, this area was not deep enough for packing. Area was left open and covered with Band-Aid.      Medications  oxyCODONE-acetaminophen (PERCOCET/ROXICET) 5-325 MG per tablet 1 tablet (0 tablets Oral Hold 01/04/17 2224)  lidocaine (PF) (XYLOCAINE) 1 % injection 10 mL (10 mLs Infiltration Given 01/04/17 2115)  oxyCODONE-acetaminophen (PERCOCET/ROXICET) 5-325 MG per tablet 1 tablet (1 tablet Oral Given 01/04/17 2003)  fentaNYL (SUBLIMAZE) injection 100 mcg (100 mcg Intramuscular Given 01/04/17 2115)  midazolam (VERSED) 2 MG/ML syrup 2 mg (2 mg Oral Given 01/04/17 2115)  sulfamethoxazole-trimethoprim (BACTRIM DS,SEPTRA DS) 800-160 MG per tablet 1 tablet (1 tablet Oral Given 01/04/17 2224)     ____________________________________________   INITIAL IMPRESSION / ASSESSMENT AND PLAN / ED COURSE  Pertinent labs & imaging results that were available during my care of the patient were  reviewed by me and considered in my medical decision making (see chart for details).  Review of the [place] CSRS was performed in accordance of the [facility] prior to dispensing any controlled drugs.     Patient's diagnosis is consistent with left labial abscess. This was incised and drained as described above.. Patient will be discharged home with prescriptions for limited pain medication and antibiotics. Patient is to follow up with primary care as needed or otherwise directed. Patient is given ED precautions to return to the ED for any worsening or new symptoms.     ____________________________________________  FINAL CLINICAL IMPRESSION(S) / ED DIAGNOSES  Final diagnoses:  Left genital labial  abscess      NEW MEDICATIONS STARTED DURING THIS VISIT:  New Prescriptions   HYDROCODONE-ACETAMINOPHEN (NORCO/VICODIN) 5-325 MG TABLET    Take 1 tablet by mouth every 4 (four) hours as needed for moderate pain.   SULFAMETHOXAZOLE-TRIMETHOPRIM (BACTRIM DS,SEPTRA DS) 800-160 MG TABLET    Take 1 tablet by mouth 2 (two) times daily.        This chart was dictated using voice recognition software/Dragon. Despite best efforts to proofread, errors can occur which can change the meaning. Any change was purely unintentional.    Racheal Patches, PA-C 01/04/17 2230    Phineas Semen, MD 01/04/17 2242

## 2018-09-30 ENCOUNTER — Emergency Department
Admission: EM | Admit: 2018-09-30 | Discharge: 2018-09-30 | Disposition: A | Discharge status: Home / Self Care | Payer: Self-pay | Attending: Student in an Organized Health Care Education/Training Program | Admitting: Student in an Organized Health Care Education/Training Program

## 2018-09-30 ENCOUNTER — Other Ambulatory Visit: Payer: Self-pay

## 2018-09-30 ENCOUNTER — Emergency Department: Payer: Self-pay

## 2018-09-30 DIAGNOSIS — F17210 Nicotine dependence, cigarettes, uncomplicated: Secondary | ICD-10-CM | POA: Insufficient documentation

## 2018-09-30 DIAGNOSIS — R102 Pelvic and perineal pain: Secondary | ICD-10-CM | POA: Insufficient documentation

## 2018-09-30 DIAGNOSIS — N938 Other specified abnormal uterine and vaginal bleeding: Secondary | ICD-10-CM | POA: Insufficient documentation

## 2018-09-30 DIAGNOSIS — R9389 Abnormal findings on diagnostic imaging of other specified body structures: Secondary | ICD-10-CM

## 2018-09-30 DIAGNOSIS — R935 Abnormal findings on diagnostic imaging of other abdominal regions, including retroperitoneum: Secondary | ICD-10-CM | POA: Insufficient documentation

## 2018-09-30 LAB — COMPREHENSIVE METABOLIC PANEL
ALT: 10 U/L (ref 0–44)
AST: 18 U/L (ref 15–41)
Albumin: 4.4 g/dL (ref 3.5–5.0)
Alkaline Phosphatase: 60 U/L (ref 38–126)
Anion gap: 7 (ref 5–15)
BUN: 6 mg/dL (ref 6–20)
CO2: 26 mmol/L (ref 22–32)
Calcium: 9.2 mg/dL (ref 8.9–10.3)
Chloride: 105 mmol/L (ref 98–111)
Creatinine, Ser: 0.66 mg/dL (ref 0.44–1.00)
GFR calc Af Amer: >60 mL/min (ref >60)
GFR calc non Af Amer: >60 mL/min (ref >60)
Glucose, Bld: 76 mg/dL (ref 70–99)
Potassium: 3.9 mmol/L (ref 3.5–5.1)
Sodium: 138 mmol/L (ref 135–145)
Total Bilirubin: 0.4 mg/dL (ref 0.3–1.2)
Total Protein: 7.2 g/dL (ref 6.5–8.1)

## 2018-09-30 LAB — CBC
HCT: 43.9 % (ref 36.0–46.0)
Hemoglobin: 14.5 g/dL (ref 12.0–15.0)
MCH: 30.5 pg (ref 26.0–34.0)
MCHC: 33 g/dL (ref 30.0–36.0)
MCV: 92.2 fL (ref 80.0–100.0)
Platelets: 208 10*3/uL (ref 150–400)
RBC: 4.76 MIL/uL (ref 3.87–5.11)
RDW: 12.3 % (ref 11.5–15.5)
WBC: 4.2 10*3/uL (ref 4.0–10.5)
nRBC: 0 % (ref 0.0–0.2)

## 2018-09-30 LAB — LIPASE, BLOOD: Lipase: 30 U/L (ref 11–51)

## 2018-09-30 LAB — URINALYSIS, COMPLETE (UACMP) WITH MICROSCOPIC
Bacteria, UA: NONE SEEN
Bilirubin Urine: NEGATIVE
Glucose, UA: NEGATIVE mg/dL
Hgb urine dipstick: NEGATIVE
Ketones, ur: NEGATIVE mg/dL
Leukocytes, UA: NEGATIVE
Nitrite: NEGATIVE
Protein, ur: NEGATIVE mg/dL
Specific Gravity, Urine: 1.008 (ref 1.005–1.030)
pH: 6 (ref 5.0–8.0)

## 2018-09-30 LAB — WET PREP, GENITAL
Clue Cells Wet Prep HPF POC: NONE SEEN
Sperm: NONE SEEN
Trich, Wet Prep: NONE SEEN
WBC, Wet Prep HPF POC: NONE SEEN
Yeast Wet Prep HPF POC: NONE SEEN

## 2018-09-30 LAB — CHLAMYDIA/NGC RT PCR (ARMC ONLY)
Chlamydia Tr: NOT DETECTED
N gonorrhoeae: NOT DETECTED

## 2018-09-30 LAB — POCT PREGNANCY, URINE: Preg Test, Ur: NEGATIVE

## 2018-09-30 MED ORDER — HYDROCODONE-ACETAMINOPHEN 5-325 MG PO TABS
1.0000 | ORAL_TABLET | Freq: Once | ORAL | Status: AC
  Administered 2018-09-30: 1 via ORAL
  Filled 2018-09-30: qty 1

## 2018-09-30 NOTE — ED Triage Notes (Addendum)
Pt comes via POV from home with c/o vaginal bleeding and cramping. Pt states she was on her menstrual cycle and stopped for a day.  Pt then states discharge blood.  Pt states dark blood, denies clots.  Pt states lower cramping. Pt states some nausea and loss of appetite. Pt denies any urinary symptoms or odor present.

## 2018-09-30 NOTE — Discharge Instructions (Addendum)

## 2018-09-30 NOTE — ED Notes (Signed)
Pt c/o brown colored discharge for the past couple of days with right lower pelvic/abd pain. Denies pain with urination.

## 2018-09-30 NOTE — ED Provider Notes (Signed)
[facility] Emergency Department Provider Note    First MD Initiated Contact with Patient 09/30/18 1820     (approximate)  I have reviewed the triage vital signs and the nursing notes.   HISTORY  Chief Complaint Abdominal Pain and Vaginal Bleeding    HPI Stefanie Sparks is a 22 y.o. female presents the ER with vaginal bleeding cramping and discomfort states that she just completed her last menstrual cycle but had severe right pelvic pain.  States it feels like a strong menstrual cycle.  Describes the pain is moderate to severe but states the pain is resolved she is not having as much discomfort right now.  Is probably concerned because she has abnormal discharge and does have a history of PID.    History reviewed. No pertinent past medical history. No family history on file. Past Surgical History:  Procedure Laterality Date  . NO PAST SURGERIES     Patient Active Problem List   Diagnosis Date Noted  . Normal labor and delivery 04/23/2016  . Labor and delivery, indication for care 04/22/2016  . Indication for care in labor and delivery, delivered 04/20/2016  . Gastroenteritis 04/06/2016      Prior to Admission medications   Medication Sig Start Date End Date Taking? Authorizing Provider  acetaminophen (TYLENOL) 325 MG tablet Take 2 tablets (650 mg total) by mouth every 4 (four) hours as needed (for pain scale < 4). 04/25/16   Ward, Elenora Fenderhelsea C, MD  HYDROcodone-acetaminophen (NORCO/VICODIN) 5-325 MG tablet Take 1 tablet by mouth every 4 (four) hours as needed for moderate pain. 01/04/17   Cuthriell, Delorise RoyalsJonathan D, PA-C  ibuprofen (ADVIL,MOTRIN) 600 MG tablet Take 1 tablet (600 mg total) by mouth every 6 (six) hours. 04/25/16   Ward, Elenora Fenderhelsea C, MD  medroxyPROGESTERone (DEPO-PROVERA) 150 MG/ML injection Inject 1 mL (150 mg total) into the muscle every 3 (three) months. 04/25/16   Ward, Elenora Fenderhelsea C, MD  ondansetron (ZOFRAN) 4 MG tablet Take 4 mg by mouth 1 day or 1  dose.    [provider]  Prenatal Vit-Fe Fumarate-FA (PRENATAL VITAMIN PO) Take by mouth.    [provider]  sulfamethoxazole-trimethoprim (BACTRIM DS,SEPTRA DS) 800-160 MG tablet Take 1 tablet by mouth 2 (two) times daily. 01/04/17   Cuthriell, Delorise RoyalsJonathan D, PA-C    Allergies Patient has no known allergies.    Social History Social History   Tobacco Use  . Smoking status: Current Every Day Smoker    Packs/day: 1.00    Types: Cigarettes  . Smokeless tobacco: Never Used  Substance Use Topics  . Alcohol use: No  . Drug use: Yes    Types: Marijuana    Review of Systems Patient denies headaches, rhinorrhea, blurry vision, numbness, shortness of breath, chest pain, edema, cough, abdominal pain, nausea, vomiting, diarrhea, dysuria, fevers, rashes or hallucinations unless otherwise stated above in HPI. ____________________________________________   PHYSICAL EXAM:  VITAL SIGNS: Vitals:   09/30/18 2146 09/30/18 2146  BP:  100/70  Pulse: 64   Resp: 14   Temp:    SpO2: 99%     Constitutional: Alert and oriented.  Eyes: Conjunctivae are normal.  Head: Atraumatic. Nose: No congestion/rhinnorhea. Mouth/Throat: Mucous membranes are moist.   Neck: No stridor. Painless ROM.  Cardiovascular: Normal rate, regular rhythm. Grossly normal heart sounds.  Good peripheral circulation. Respiratory: Normal respiratory effort.  No retractions. Lungs CTAB. Gastrointestinal: Soft and nontender. No distention. No abdominal bruits. No CVA tenderness. Genitourinary: Does have tenderness palpation  the right groin and adnexa but no cervical motion tenderness noted.  No discharge noted either. Musculoskeletal: No lower extremity tenderness nor edema.  No joint effusions. Neurologic:  Normal speech and language. No gross focal neurologic deficits are appreciated. No facial droop Skin:  Skin is warm, dry and intact. No rash noted. Psychiatric: Mood and affect are normal. Speech and  behavior are normal.  ____________________________________________   LABS (all labs ordered are listed, but only abnormal results are displayed)  Results for orders placed or performed during the hospital encounter of 09/30/18 (from the past 24 hour(s))  Urinalysis, Complete w Microscopic     Status: Abnormal   Collection Time: 09/30/18  6:01 PM  Result Value Ref Range   Color, Urine STRAW (A) YELLOW   APPearance CLEAR (A) CLEAR   Specific Gravity, Urine 1.008 1.005 - 1.030   pH 6.0 5.0 - 8.0   Glucose, UA NEGATIVE NEGATIVE mg/dL   Hgb urine dipstick NEGATIVE NEGATIVE   Bilirubin Urine NEGATIVE NEGATIVE   Ketones, ur NEGATIVE NEGATIVE mg/dL   Protein, ur NEGATIVE NEGATIVE mg/dL   Nitrite NEGATIVE NEGATIVE   Leukocytes, UA NEGATIVE NEGATIVE   RBC / HPF 0-5 0 - 5 RBC/hpf   WBC, UA 0-5 0 - 5 WBC/hpf   Bacteria, UA NONE SEEN NONE SEEN   Squamous Epithelial / LPF 0-5 0 - 5   Mucus PRESENT   Lipase, blood     Status: None   Collection Time: 09/30/18  6:02 PM  Result Value Ref Range   Lipase 30 11 - 51 U/L  Comprehensive metabolic panel     Status: None   Collection Time: 09/30/18  6:02 PM  Result Value Ref Range   Sodium 138 135 - 145 mmol/L   Potassium 3.9 3.5 - 5.1 mmol/L   Chloride 105 98 - 111 mmol/L   CO2 26 22 - 32 mmol/L   Glucose, Bld 76 70 - 99 mg/dL   BUN 6 6 - 20 mg/dL   Creatinine, Ser 5.36 0.44 - 1.00 mg/dL   Calcium 9.2 8.9 - 64.4 mg/dL   Total Protein 7.2 6.5 - 8.1 g/dL   Albumin 4.4 3.5 - 5.0 g/dL   AST 18 15 - 41 U/L   ALT 10 0 - 44 U/L   Alkaline Phosphatase 60 38 - 126 U/L   Total Bilirubin 0.4 0.3 - 1.2 mg/dL   GFR calc non Af Amer >60 >60 mL/min   GFR calc Af Amer >60 >60 mL/min   Anion gap 7 5 - 15  CBC     Status: None   Collection Time: 09/30/18  6:02 PM  Result Value Ref Range   WBC 4.2 4.0 - 10.5 K/uL   RBC 4.76 3.87 - 5.11 MIL/uL   Hemoglobin 14.5 12.0 - 15.0 g/dL   HCT 03.4 74.2 - 59.5 %   MCV 92.2 80.0 - 100.0 fL   MCH 30.5 26.0 - 34.0  pg   MCHC 33.0 30.0 - 36.0 g/dL   RDW 63.8 75.6 - 43.3 %   Platelets 208 150 - 400 K/uL   nRBC 0.0 0.0 - 0.2 %  Pregnancy, urine POC     Status: None   Collection Time: 09/30/18  6:10 PM  Result Value Ref Range   Preg Test, Ur NEGATIVE NEGATIVE  Wet prep, genital     Status: None   Collection Time: 09/30/18  6:53 PM  Result Value Ref Range   Yeast Wet Prep HPF POC NONE  SEEN NONE SEEN   Trich, Wet Prep NONE SEEN NONE SEEN   Clue Cells Wet Prep HPF POC NONE SEEN NONE SEEN   WBC, Wet Prep HPF POC NONE SEEN NONE SEEN   Sperm NONE SEEN   Chlamydia/NGC rt PCR ([facility] only)     Status: None   Collection Time: 09/30/18  6:53 PM  Result Value Ref Range   Specimen source GC/Chlam ENDOCERVICAL    Chlamydia Tr NOT DETECTED NOT DETECTED   N gonorrhoeae NOT DETECTED NOT DETECTED   ____________________________________________ ______________________________  RADIOLOGY  I personally reviewed all radiographic images ordered to evaluate for the above acute complaints and reviewed radiology reports and findings.  These findings were personally discussed with the patient.  Please see medical record for radiology report.  ____________________________________________   PROCEDURES  Procedure(s) performed:  Procedures    Critical Care performed: no ____________________________________________   INITIAL IMPRESSION / ASSESSMENT AND PLAN / ED COURSE  Pertinent labs & imaging results that were available during my care of the patient were reviewed by me and considered in my medical decision making (see chart for details).   DDX: Ectopic, torsion, PID, TOA, BV, UTI, stone  Stefanie Sparks is a 22 y.o. who presents to the ED with symptoms as described above.  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  No evidence of herpetic lesion.  We will  send swabs evaluate for infectious process but will also order ultrasound to further characterize and exclude torsion.  She is not pregnant is not consistent with ectopic.  Clinical Course as of Sep 30 2220  Wed Sep 30, 2018  2116 Results of ultrasound with patient and need for outpatient follow-up with OB/GYN.  Repeat abdominal exam is soft and benign.  At this point do believe she stable and appropriate for outpatient follow-up .  Have discussed with the patient and available family all diagnostics and treatments performed thus far and all questions were answered to the best of my ability. The patient demonstrates understanding and agreement with plan.    [PR]    Clinical Course User Index [PR] Willy Eddy, MD     As part of my medical decision making, I reviewed the following data within the electronic MEDICAL RECORD NUMBER Nursing notes reviewed and incorporated, Labs reviewed, notes from prior ED visits. ____________________________________________   FINAL CLINICAL IMPRESSION(S) / ED DIAGNOSES  Final diagnoses:  Pelvic pain  Abnormal ultrasound of pelvis      NEW MEDICATIONS STARTED DURING THIS VISIT:  Discharge Medication List as of 09/30/2018  9:15 PM       Note:  This document was prepared using Dragon voice recognition software and may include unintentional dictation errors.    Willy Eddy, MD 09/30/18 2221

## 2018-10-02 ENCOUNTER — Ambulatory Visit (INDEPENDENT_AMBULATORY_CARE_PROVIDER_SITE_OTHER): Payer: Self-pay | Admitting: Obstetrics and Gynecology

## 2018-10-02 ENCOUNTER — Encounter: Payer: Self-pay | Admitting: Obstetrics and Gynecology

## 2018-10-02 DIAGNOSIS — R102 Pelvic and perineal pain: Secondary | ICD-10-CM

## 2018-10-02 DIAGNOSIS — G8929 Other chronic pain: Secondary | ICD-10-CM | POA: Insufficient documentation

## 2018-10-02 HISTORY — DX: Other chronic pain: G89.29

## 2018-10-02 NOTE — Progress Notes (Signed)
Obstetrics & Gynecology Office Visit   Chief Complaint  Patient presents with  . ER follow up    right ovarian mass per [facility]   History of Present Illness: 22 y.o. [id number] female who presents in follow up from the ER on 12/11 for right lower quadrant pain.  She has a long history of pelvic pain that does not always occur with menses.  In the past two years her symptoms of pain have become worse.  Especially in the past two weeks the symptoms are describes as unbearable.  Her pain is located on her right lower quadrant.  The pain does not radiate.  At its worst, her pain score is 10/10.  She describes the pain as a stabbing sensation with twist, "like a contraction cramp." Alleviating factors: lying down in a ball, deep breaths, hot shower (she can't take a bath), sometimes Tylenol. Aggravating factors: intercourse.  Mostly the pain occurs sporadically.  Associated symptoms include: when she takes a deep breath she has a discomfort in her upper abdomen. 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  She had negative testing for BV, trichomonas, gonorrhea, chlamydia, UTI, and pregnancy at the ER.  She had a pelvic ultrasound that per the report states that has a right ovarian mass measuring "8 x 6 x 1 cm, consistent with either a hemorrhagic cyst or endometrioma."  Upon review of the actual images, the units are actually 8 mm x 6 mm x 1 cm.    Past Medical History:  Diagnosis Date  . No known health problems     Past Surgical History:  Procedure Laterality Date  . NO PAST SURGERIES      Gynecologic History: Patient's last menstrual period was 09/23/2018.  Obstetric History: G2P2002, s/p SVD x 2, uncomplicated deliveries  Family History  Problem Relation Age of Onset  . Endometriosis Maternal Grandmother   . Diabetes Maternal Grandmother   . Endometriosis Mother     Social History   Socioeconomic History  . Marital status: Single    Spouse name: Not on file  . Number of  children: Not on file  . Years of education: Not on file  . Highest education level: Not on file  Occupational History  . Not on file  Social Needs  . Financial resource strain: Not on file  . Food insecurity:    Worry: Not on file    Inability: Not on file  . Transportation needs:    Medical: Not on file    Non-medical: Not on file  Tobacco Use  . Smoking status: Current Every Day Smoker    Packs/day: 1.00    Types: Cigarettes  . Smokeless tobacco: Never Used  Substance and Sexual Activity  . Alcohol use: No  . Drug use: Yes    Types: Marijuana  . Sexual activity: Yes    Birth control/protection: None  Lifestyle  . Physical activity:    Days per week: Not on file    Minutes per session: Not on file  . Stress: Not on file  Relationships  . Social connections:    Talks on phone: Not on file    Gets together: Not on file    Attends religious service: Not on file    Active member of club or organization: Not on file    Attends meetings of clubs or organizations: Not on file    Relationship status: Not on file  . Intimate partner violence:    Fear  of current or ex partner: Not on file    Emotionally abused: Not on file    Physically abused: Not on file    Forced sexual activity: Not on file  Other Topics Concern  . Not on file  Social History Narrative  . Not on file   Allergies: No Known Allergies  Prior to Admission medications: denies    Review of Systems  Constitutional: Negative.   HENT: Negative.   Eyes: Negative.   Respiratory: Negative.   Cardiovascular: Negative.   Gastrointestinal: Negative.   Genitourinary: Negative.   Musculoskeletal: Negative.   Skin: Negative.   Neurological: Negative.   Psychiatric/Behavioral: Negative.      Physical Exam BP (!) 96/58 (BP Location: Left Arm, Patient Position: Sitting, Cuff Size: Normal)   Pulse 72   Ht 4\' 11"  (1.499 m)   Wt 112 lb (50.8 kg)   LMP 09/23/2018   BMI 22.62 kg/m  Patient's last menstrual  period was 09/23/2018. Physical Exam Constitutional:      General: She is not in acute distress.    Appearance: Normal appearance.  HENT:     Head: Normocephalic and atraumatic.  Eyes:     General: No scleral icterus.    Conjunctiva/sclera: Conjunctivae normal.  Neck:     Musculoskeletal: Normal range of motion and neck supple.  Cardiovascular:     Rate and Rhythm: Normal rate and regular rhythm.     Heart sounds: No murmur. No gallop.   Pulmonary:     Effort: Pulmonary effort is normal. No respiratory distress.     Breath sounds: Normal breath sounds. No wheezing, rhonchi or rales.  Abdominal:     General: Abdomen is flat. Bowel sounds are normal. There is no distension (mild RLQ ttp.  Still present with abdominal muscles flexed. Near the inguinal ligament. No hernias noted. no masses palpated).     Palpations: Abdomen is soft. There is no mass.     Tenderness: There is abdominal tenderness. There is no guarding or rebound.     Hernia: No hernia is present.  Musculoskeletal: Normal range of motion.        General: No swelling or tenderness.  Neurological:     General: No focal deficit present.     Mental Status: She is alert and oriented to person, place, and time.     Cranial Nerves: No cranial nerve deficit.  Skin:    General: Skin is warm and dry.  Psychiatric:        Mood and Affect: Mood normal.        Behavior: Behavior normal.        Judgment: Judgment normal.  Vitals signs reviewed.    Assessment: 22 y.o. G17P2002 female here for  1. Chronic pelvic pain in female      Plan: Problem List Items Addressed This Visit      Other   Chronic pelvic pain in female     Discussed potential sources of her pain.  Her imaging was not specific. However, I did review her imaging directly with her and she does NOT have an 8x6cm mass. The lesion measures 0.8 x 0.6 x 1 cm.  We discussed potential etiologies of pain, including infectious (for which she was ruled out at the ER),  anatomic (no large masses noted on ultrasound), endometriosis, and pain originating from a different organ system (GI, GU, MSK, psychiatric, etc).  Discussed endometriosis and its diagnosis and treatment, which include, no treatment, medication, surgery for diagnosis and  management. Today, she elects to have no treatment and continue with the usual measures she has taken for pain.   30 minutes spent in face to face discussion with > 50% spent in counseling,management, and coordination of care of her chronic pelvic pain.   Thomasene MohairStephen Danique Hartsough, MD 10/02/2018 5:27 PM

## 2018-10-05 ENCOUNTER — Telehealth: Payer: Self-pay

## 2018-10-05 NOTE — Telephone Encounter (Signed)
[place]  [phone]  Pt aware letter up front ready for p/u.

## 2018-11-02 ENCOUNTER — Emergency Department: Admission: EM | Admit: 2018-11-02 | Discharge: 2018-11-02 | Discharge status: Against Medical Advice | Payer: Self-pay

## 2019-05-08 ENCOUNTER — Emergency Department
Admission: EM | Admit: 2019-05-08 | Discharge: 2019-05-08 | Disposition: A | Discharge status: Home / Self Care | Payer: Medicaid Other | Attending: Emergency Medicine | Admitting: Emergency Medicine

## 2019-05-08 ENCOUNTER — Encounter: Payer: Self-pay | Admitting: Emergency Medicine

## 2019-05-08 ENCOUNTER — Other Ambulatory Visit: Payer: Self-pay

## 2019-05-08 DIAGNOSIS — Z5321 Procedure and treatment not carried out due to patient leaving prior to being seen by health care provider: Secondary | ICD-10-CM | POA: Diagnosis not present

## 2019-05-08 DIAGNOSIS — R1030 Lower abdominal pain, unspecified: Secondary | ICD-10-CM | POA: Diagnosis present

## 2019-05-08 LAB — URINALYSIS, COMPLETE (UACMP) WITH MICROSCOPIC
Bilirubin Urine: NEGATIVE
Glucose, UA: NEGATIVE mg/dL
Hgb urine dipstick: NEGATIVE
Ketones, ur: NEGATIVE mg/dL
Leukocytes,Ua: NEGATIVE
Nitrite: NEGATIVE
Protein, ur: NEGATIVE mg/dL
Specific Gravity, Urine: 1.015 (ref 1.005–1.030)
pH: 6 (ref 5.0–8.0)

## 2019-05-08 LAB — COMPREHENSIVE METABOLIC PANEL
ALT: 9 U/L (ref 0–44)
AST: 14 U/L — ABNORMAL LOW (ref 15–41)
Albumin: 4.7 g/dL (ref 3.5–5.0)
Alkaline Phosphatase: 57 U/L (ref 38–126)
Anion gap: 7 (ref 5–15)
BUN: 8 mg/dL (ref 6–20)
CO2: 26 mmol/L (ref 22–32)
Calcium: 9.3 mg/dL (ref 8.9–10.3)
Chloride: 109 mmol/L (ref 98–111)
Creatinine, Ser: 0.61 mg/dL (ref 0.44–1.00)
GFR calc Af Amer: >60 mL/min (ref >60)
GFR calc non Af Amer: >60 mL/min (ref >60)
Glucose, Bld: 98 mg/dL (ref 70–99)
Potassium: 3.9 mmol/L (ref 3.5–5.1)
Sodium: 142 mmol/L (ref 135–145)
Total Bilirubin: 0.7 mg/dL (ref 0.3–1.2)
Total Protein: 7.2 g/dL (ref 6.5–8.1)

## 2019-05-08 LAB — CBC
HCT: 41.9 % (ref 36.0–46.0)
Hemoglobin: 14 g/dL (ref 12.0–15.0)
MCH: 30 pg (ref 26.0–34.0)
MCHC: 33.4 g/dL (ref 30.0–36.0)
MCV: 89.7 fL (ref 80.0–100.0)
Platelets: 225 10*3/uL (ref 150–400)
RBC: 4.67 MIL/uL (ref 3.87–5.11)
RDW: 12.4 % (ref 11.5–15.5)
WBC: 5.4 10*3/uL (ref 4.0–10.5)
nRBC: 0 % (ref 0.0–0.2)

## 2019-05-08 LAB — LIPASE, BLOOD: Lipase: 25 U/L (ref 11–51)

## 2019-05-08 LAB — POCT PREGNANCY, URINE: Preg Test, Ur: NEGATIVE

## 2019-05-08 MED ORDER — SODIUM CHLORIDE 0.9% FLUSH: 3.0000 mL | Freq: Once | INTRAVENOUS | Status: DC

## 2019-05-08 NOTE — ED Triage Notes (Signed)
Lower mid abdominal pain x 1 week. Abdominal pain with urination.

## 2019-05-19 ENCOUNTER — Other Ambulatory Visit: Payer: Self-pay

## 2019-05-19 ENCOUNTER — Ambulatory Visit (LOCAL_COMMUNITY_HEALTH_CENTER): Payer: Self-pay

## 2019-05-19 VITALS — BP 111/78 | Ht 59.5 in | Wt 104.8 lb

## 2019-05-19 DIAGNOSIS — Z3009 Encounter for other general counseling and advice on contraception: Secondary | ICD-10-CM

## 2019-05-19 DIAGNOSIS — Z30013 Encounter for initial prescription of injectable contraceptive: Secondary | ICD-10-CM

## 2019-05-19 DIAGNOSIS — Z113 Encounter for screening for infections with a predominantly sexual mode of transmission: Secondary | ICD-10-CM

## 2019-05-19 MED ORDER — MEDROXYPROGESTERONE ACETATE 150 MG/ML IM SUSP
150.0000 mg | Freq: Once | INTRAMUSCULAR | Status: AC
  Administered 2019-05-19: 150 mg via INTRAMUSCULAR

## 2019-05-19 NOTE — Progress Notes (Signed)
   [place] problem visit  [place] Department  Subjective:  Stefanie Sparks is a 23 y.o. being seen today for   Chief Complaint  Patient presents with  . Contraception    Depo and STD screen    Here to start Depo.  Last Depo about 3 yrs ago.  LMP 04/24/19 last sex 3//1/20.  Also, states that she has noted thick, white vaginal disch with slight odor.  States she has had freq  BV infections in the past.  Client would like STD screen today, no bloodwork    Does the patient have a current or past history of drug use? No   No components found for: HCV]   Health Maintenance Due  Topic Date Due  . TETANUS/TDAP  11/16/2012    Review of Systems  Genitourinary:       Thick, white disch with slight odor    The following portions of the patient's history were reviewed and updated as appropriate: allergies, current medications, past family history, past medical history, past social history, past surgical history and problem list. Problem list updated.   See flowsheet for other program required questions.  Objective:   Vitals:   05/19/19 0829  BP: 111/78  Weight: 104 lb 12.8 oz (47.5 kg)  Height: 4' 11.5" (1.511 m)    Physical Exam HENT:     Mouth/Throat:     Pharynx: Oropharynx is clear.  Neck:     Musculoskeletal: Neck supple. No muscular tenderness.  Genitourinary:    General: Normal vulva.     Comments: Mod amt of Blood noted , unable to do wet prep Lymphadenopathy:     Cervical: No cervical adenopathy.  Skin:    General: Skin is warm.     Findings: No lesion.  Neurological:     Mental Status: She is alert.       Assessment and Plan:  Stefanie Sparks is a 23 y.o. female presenting to the [facility] Department for a Women's Health problem visit  [facility] Depo Provera 150 mg IM today x 1 RF.  Client will need to call clinic for next Depo and possible complete RV STD screen GC/CT done today Wet prep  not done d/t bleeding. Co client that is if she con't to have disch after menses to schedule another appt.  No follow-ups on file.  No future appointments.  Hassell Done, FNP

## 2019-05-19 NOTE — Progress Notes (Signed)
Here to start Depo back for endometriosis. Doesn't have pain during periods but when not having periods. Declines HIV/RPR Aileen Fass, RN  Depo given in left glut; tolerated well Aileen Fass, RN

## 2019-06-24 ENCOUNTER — Other Ambulatory Visit: Payer: Self-pay

## 2019-06-24 DIAGNOSIS — Z20822 Contact with and (suspected) exposure to covid-19: Secondary | ICD-10-CM

## 2019-06-25 LAB — NOVEL CORONAVIRUS, NAA: SARS-CoV-2, NAA: NOT DETECTED

## 2019-06-29 ENCOUNTER — Other Ambulatory Visit: Payer: Self-pay

## 2019-06-29 DIAGNOSIS — Z20822 Contact with and (suspected) exposure to covid-19: Secondary | ICD-10-CM

## 2019-07-01 LAB — NOVEL CORONAVIRUS, NAA: SARS-CoV-2, NAA: NOT DETECTED

## 2019-08-05 ENCOUNTER — Other Ambulatory Visit: Payer: Self-pay

## 2019-08-05 ENCOUNTER — Encounter: Payer: Self-pay | Admitting: Emergency Medicine

## 2019-08-05 ENCOUNTER — Emergency Department
Admission: EM | Admit: 2019-08-05 | Discharge: 2019-08-06 | Disposition: A | Discharge status: Home / Self Care | Payer: Medicaid Other | Attending: Emergency Medicine | Admitting: Emergency Medicine

## 2019-08-05 DIAGNOSIS — F1210 Cannabis abuse, uncomplicated: Secondary | ICD-10-CM | POA: Diagnosis not present

## 2019-08-05 DIAGNOSIS — F17210 Nicotine dependence, cigarettes, uncomplicated: Secondary | ICD-10-CM | POA: Insufficient documentation

## 2019-08-05 DIAGNOSIS — K529 Noninfective gastroenteritis and colitis, unspecified: Secondary | ICD-10-CM | POA: Insufficient documentation

## 2019-08-05 DIAGNOSIS — R1110 Vomiting, unspecified: Secondary | ICD-10-CM | POA: Diagnosis present

## 2019-08-05 LAB — CBC
HCT: 40.4 % (ref 36.0–46.0)
Hemoglobin: 14 g/dL (ref 12.0–15.0)
MCH: 29.9 pg (ref 26.0–34.0)
MCHC: 34.7 g/dL (ref 30.0–36.0)
MCV: 86.3 fL (ref 80.0–100.0)
Platelets: 264 10*3/uL (ref 150–400)
RBC: 4.68 MIL/uL (ref 3.87–5.11)
RDW: 12 % (ref 11.5–15.5)
WBC: 7.8 10*3/uL (ref 4.0–10.5)
nRBC: 0 % (ref 0.0–0.2)

## 2019-08-05 LAB — COMPREHENSIVE METABOLIC PANEL
ALT: 9 U/L (ref 0–44)
AST: 15 U/L (ref 15–41)
Albumin: 4.8 g/dL (ref 3.5–5.0)
Alkaline Phosphatase: 60 U/L (ref 38–126)
Anion gap: 4 — ABNORMAL LOW (ref 5–15)
BUN: 6 mg/dL (ref 6–20)
CO2: 25 mmol/L (ref 22–32)
Calcium: 9.6 mg/dL (ref 8.9–10.3)
Chloride: 109 mmol/L (ref 98–111)
Creatinine, Ser: 0.74 mg/dL (ref 0.44–1.00)
GFR calc Af Amer: >60 mL/min (ref >60)
GFR calc non Af Amer: >60 mL/min (ref >60)
Glucose, Bld: 99 mg/dL (ref 70–99)
Potassium: 4.3 mmol/L (ref 3.5–5.1)
Sodium: 138 mmol/L (ref 135–145)
Total Bilirubin: 0.8 mg/dL (ref 0.3–1.2)
Total Protein: 7.8 g/dL (ref 6.5–8.1)

## 2019-08-05 LAB — URINALYSIS, COMPLETE (UACMP) WITH MICROSCOPIC
Bacteria, UA: NONE SEEN
Bilirubin Urine: NEGATIVE
Glucose, UA: NEGATIVE mg/dL
Hgb urine dipstick: NEGATIVE
Ketones, ur: NEGATIVE mg/dL
Leukocytes,Ua: NEGATIVE
Nitrite: NEGATIVE
Protein, ur: NEGATIVE mg/dL
Specific Gravity, Urine: 1.001 — ABNORMAL LOW (ref 1.005–1.030)
Squamous Epithelial / HPF: NONE SEEN (ref 0–5)
WBC, UA: NONE SEEN WBC/hpf (ref 0–5)
pH: 7 (ref 5.0–8.0)

## 2019-08-05 LAB — LIPASE, BLOOD: Lipase: 39 U/L (ref 11–51)

## 2019-08-05 LAB — POCT PREGNANCY, URINE: Preg Test, Ur: NEGATIVE

## 2019-08-05 NOTE — ED Triage Notes (Signed)
Pt in via POV, reports N/V/D since Monday, pt states she has been unable to eat a full meal since Sunday.  Vitals WDL, NAD noted at this time.

## 2019-08-06 MED ORDER — ONDANSETRON 4 MG PO TBDP: ORAL_TABLET | ORAL | 0 refills | Status: DC

## 2019-08-06 NOTE — ED Provider Notes (Signed)
[facility] Emergency Department Provider Note  ____________________________________________   First MD Initiated Contact with Patient 08/06/19 0009     (approximate)  I have reviewed the triage vital signs and the nursing notes.   HISTORY  Chief Complaint Emesis and Diarrhea    HPI Stefanie Sparks is a 23 y.o. female who has had episodes of gastroenteritis in the past and presents tonight with reports of nausea, vomiting, and diarrhea for the last 4 to 5 days.  She said that she has not had much of an appetite but is not having any abdominal pain.  She has stopped vomiting but still has some persistent nausea and is having multiple watery stools every day.  She did not have a particular bad food exposure that she can think of anyone else in her family has been ill although her father, who has a lot of chronic medical issues, has been nauseated as well.  She said that when she eats or drinks anything she almost immediately has to go to the bathroom for a bowel movement even though she is not vomiting.  She is try to stay hydrated by drinking plenty of fluids.  She denies fever/chills, sore throat, chest pain, shortness of breath, cough, abdominal pain, dysuria, and any vaginal symptoms.  She has not had a menstrual period since July because she is on Depo-Provera.  She describes the symptoms as severe nothing particular makes them better or worse.         Past Medical History:  Diagnosis Date  . Gastroenteritis 04/06/2016  . No known health problems     Patient Active Problem List   Diagnosis Date Noted  . Chronic pelvic pain in female 10/02/2018  . Migraine 06/17/2011    Past Surgical History:  Procedure Laterality Date  . NO PAST SURGERIES      Prior to Admission medications   Medication Sig Start Date End Date Taking? Authorizing Provider  ondansetron (ZOFRAN ODT) 4 MG disintegrating tablet Allow 1-2 tablets to dissolve in your mouth every 8  hours as needed for nausea/vomiting 08/06/19   Loleta Rose, MD    Allergies Patient has no known allergies.  Family History  Problem Relation Age of Onset  . Endometriosis Maternal Grandmother   . Diabetes Maternal Grandmother   . Endometriosis Mother   . Bipolar disorder Father   . Asthma Brother   . Bipolar disorder Brother   . Asthma Son   . Seizures Son     Social History Social History   Tobacco Use  . Smoking status: Current Every Day Smoker    Packs/day: 0.25    Years: 9.00    Pack years: 2.25    Types: Cigarettes  . Smokeless tobacco: Never Used  Substance Use Topics  . Alcohol use: No  . Drug use: Yes    Types: Marijuana    Review of Systems Constitutional: No fever/chills Eyes: No visual changes. ENT: No sore throat. Cardiovascular: Denies chest pain. Respiratory: Denies shortness of breath. Gastrointestinal: Persistent nausea, vomiting, and diarrhea for the last 4 to 5 days although the vomiting has resolved.  No abdominal pain. Genitourinary: Negative for dysuria. Musculoskeletal: Negative for neck pain.  Negative for back pain. Integumentary: Negative for rash. Neurological: Negative for headaches, focal weakness or numbness.   ____________________________________________   PHYSICAL EXAM:  VITAL SIGNS: ED Triage Vitals  Enc Vitals Group     BP 08/05/19 2135 110/77     Pulse Rate 08/05/19 2135 80  Resp 08/05/19 2135 16     Temp 08/05/19 2135 98.6 F (37 C)     Temp Source 08/05/19 2135 Oral     SpO2 08/05/19 2135 98 %     Weight 08/05/19 2136 45.4 kg (100 lb)     Height 08/05/19 2136 1.499 m (4\' 11" )     Head Circumference --      Peak Flow --      Pain Score 08/05/19 2136 10     Pain Loc --      Pain Edu? --      Excl. in GC? --     Constitutional: Alert and oriented.  Well-appearing, no acute distress. Eyes: Conjunctivae are normal.  Head: Atraumatic. Nose: No congestion/rhinnorhea. Mouth/Throat: Mucous membranes are  moist. Neck: No stridor.  No meningeal signs.   Cardiovascular: Normal rate, regular rhythm. Good peripheral circulation. Grossly normal heart sounds. Respiratory: Normal respiratory effort.  No retractions. Gastrointestinal: Thin but healthy body habitus.  Soft and nontender. No distention.  Musculoskeletal: No lower extremity tenderness nor edema. No gross deformities of extremities. Neurologic:  Normal speech and language. No gross focal neurologic deficits are appreciated.  Skin:  Skin is warm, dry and intact. Psychiatric: Mood and affect are normal. Speech and behavior are normal.  ____________________________________________   LABS (all labs ordered are listed, but only abnormal results are displayed)  Labs Reviewed  COMPREHENSIVE METABOLIC PANEL - Abnormal; Notable for the following components:      Result Value   Anion gap 4 (*)    All other components within normal limits  URINALYSIS, COMPLETE (UACMP) WITH MICROSCOPIC - Abnormal; Notable for the following components:   Color, Urine COLORLESS (*)    APPearance CLEAR (*)    Specific Gravity, Urine 1.001 (*)    All other components within normal limits  LIPASE, BLOOD  CBC  POC URINE PREG, ED  POCT PREGNANCY, URINE   ____________________________________________  EKG  None - EKG not ordered by ED physician ____________________________________________  RADIOLOGY Marylou MccoyI, Alyx Mcguirk, personally viewed and evaluated these images (plain radiographs) as part of my medical decision making, as well as reviewing the written report by the radiologist.  ED MD interpretation: No indication for imaging  Official radiology report(s): No results found.  ____________________________________________   PROCEDURES   Procedure(s) performed (including Critical Care):  Procedures   ____________________________________________   INITIAL IMPRESSION / MDM / ASSESSMENT AND PLAN / ED COURSE  As part of my medical decision making, I  reviewed the following data within the electronic MEDICAL RECORD NUMBER Nursing notes reviewed and incorporated, Labs reviewed , Old chart reviewed and Notes from prior ED visits   Differential diagnosis includes, but is not limited to, viral gastroenteritis, bacterial gastroenteritis, cyclic vomiting syndrome, cannabinoid hyperemesis syndrome, electrolyte or metabolic abnormality, acute kidney injury.  The patient is well-appearing and in no distress.  Vital signs are normal including no tachycardia and she is afebrile.  She has no abdominal tenderness to palpation.  She appears well-hydrated in spite of her symptoms and the vomiting has resolved.  She has normal lab work including a normal metabolic panel, urinalysis without ketones, and negative urine pregnancy test.  CBC is also normal with no leukocytosis.  We discussed the results and she was reassured.  I encouraged a bland diet for the next couple of days and rehydration.  I am writing her prescription for Zofran for nausea and encouraged her to try to return to a regular diet as soon as she feels  able to do so.  We discussed sending a stool sample for a bio fire panel but she does not want to do so and I do not think it would be clinically helpful.  I gave my usual and customary return precautions and she understands and agrees with the plan.        ____________________________________________  FINAL CLINICAL IMPRESSION(S) / ED DIAGNOSES  Final diagnoses:  Gastroenteritis     MEDICATIONS GIVEN DURING THIS VISIT:  Medications - No data to display   ED Discharge Orders         Ordered    ondansetron (ZOFRAN ODT) 4 MG disintegrating tablet     08/06/19 0036          *Please note:  Stefanie Sparks was evaluated in Emergency Department on 08/06/2019 for the symptoms described in the history of present illness. She was evaluated in the context of the global COVID-19 pandemic, which necessitated consideration that the patient might  be at risk for infection with the SARS-CoV-2 virus that causes COVID-19. Institutional protocols and algorithms that pertain to the evaluation of patients at risk for COVID-19 are in a state of rapid change based on information released by regulatory bodies including the CDC and federal and state organizations. These policies and algorithms were followed during the patient's care in the ED.  Some ED evaluations and interventions may be delayed as a result of limited staffing during the pandemic.*  Note:  This document was prepared using Dragon voice recognition software and may include unintentional dictation errors.   Hinda Kehr, MD 08/06/19 [phone]

## 2019-08-06 NOTE — Discharge Instructions (Signed)

## 2019-08-17 ENCOUNTER — Other Ambulatory Visit: Payer: Self-pay

## 2019-08-17 DIAGNOSIS — Z20822 Contact with and (suspected) exposure to covid-19: Secondary | ICD-10-CM

## 2019-08-19 LAB — NOVEL CORONAVIRUS, NAA: SARS-CoV-2, NAA: NOT DETECTED

## 2019-09-04 ENCOUNTER — Encounter: Payer: Self-pay | Admitting: Emergency Medicine

## 2019-09-04 ENCOUNTER — Emergency Department
Admission: EM | Admit: 2019-09-04 | Discharge: 2019-09-04 | Disposition: A | Discharge status: Home / Self Care | Payer: Medicaid Other | Attending: Emergency Medicine | Admitting: Emergency Medicine

## 2019-09-04 ENCOUNTER — Emergency Department: Payer: Medicaid Other

## 2019-09-04 ENCOUNTER — Other Ambulatory Visit: Payer: Self-pay

## 2019-09-04 DIAGNOSIS — Z79899 Other long term (current) drug therapy: Secondary | ICD-10-CM | POA: Diagnosis not present

## 2019-09-04 DIAGNOSIS — R079 Chest pain, unspecified: Secondary | ICD-10-CM | POA: Insufficient documentation

## 2019-09-04 DIAGNOSIS — F17210 Nicotine dependence, cigarettes, uncomplicated: Secondary | ICD-10-CM | POA: Diagnosis not present

## 2019-09-04 HISTORY — DX: Endometriosis, unspecified: N80.9

## 2019-09-04 LAB — CBC
HCT: 39.5 % (ref 36.0–46.0)
Hemoglobin: 13.9 g/dL (ref 12.0–15.0)
MCH: 30.2 pg (ref 26.0–34.0)
MCHC: 35.2 g/dL (ref 30.0–36.0)
MCV: 85.7 fL (ref 80.0–100.0)
Platelets: 244 10*3/uL (ref 150–400)
RBC: 4.61 MIL/uL (ref 3.87–5.11)
RDW: 11.9 % (ref 11.5–15.5)
WBC: 9.4 10*3/uL (ref 4.0–10.5)
nRBC: 0 % (ref 0.0–0.2)

## 2019-09-04 LAB — BASIC METABOLIC PANEL
Anion gap: 10 (ref 5–15)
BUN: 10 mg/dL (ref 6–20)
CO2: 24 mmol/L (ref 22–32)
Calcium: 9.5 mg/dL (ref 8.9–10.3)
Chloride: 108 mmol/L (ref 98–111)
Creatinine, Ser: 0.69 mg/dL (ref 0.44–1.00)
GFR calc Af Amer: >60 mL/min (ref >60)
GFR calc non Af Amer: >60 mL/min (ref >60)
Glucose, Bld: 109 mg/dL — ABNORMAL HIGH (ref 70–99)
Potassium: 3.7 mmol/L (ref 3.5–5.1)
Sodium: 142 mmol/L (ref 135–145)

## 2019-09-04 LAB — TROPONIN I (HIGH SENSITIVITY)
Troponin I (High Sensitivity): <2 ng/L (ref <18)
Troponin I (High Sensitivity): <2 ng/L (ref <18)

## 2019-09-04 LAB — POCT PREGNANCY, URINE: Preg Test, Ur: NEGATIVE

## 2019-09-04 MED ORDER — ALPRAZOLAM 0.5 MG PO TABS
0.5000 mg | ORAL_TABLET | Freq: Once | ORAL | Status: AC
  Administered 2019-09-04: 0.5 mg via ORAL
  Filled 2019-09-04: qty 1

## 2019-09-04 MED ORDER — HYDROXYZINE PAMOATE 100 MG PO CAPS: 100.0000 mg | ORAL_CAPSULE | Freq: Three times a day (TID) | ORAL | 0 refills | Status: DC | PRN

## 2019-09-04 NOTE — ED Provider Notes (Signed)
[facility] Emergency Department Provider Note  ____________________________________________  Time seen: Approximately 9:55 PM  I have reviewed the triage vital signs and the nursing notes.   HISTORY  Chief Complaint Chest Pain    HPI Stefanie Sparks is a 23 y.o. female with a history of endometriosis and anxiety, presents to the emergency department with midsternal chest pain that does not worsen with exertion.  Patient reports that chest pain has occurred intermittently over the past week.  Patient states that her stress level has been increased as she has been in the process of moving and taking care of 2 small children.  Patient states that she has had at least 3 panic attacks while waiting in the emergency department.  Patient denies a history of cardiac issues.  No abdominal pain, nausea or vomiting.  No new medications.  No falls or mechanisms of trauma.  No other alleviating measures have been attempted.        Past Medical History:  Diagnosis Date  . Endometriosis   . Gastroenteritis 04/06/2016  . No known health problems     Patient Active Problem List   Diagnosis Date Noted  . Chronic pelvic pain in female 10/02/2018  . Migraine 06/17/2011    Past Surgical History:  Procedure Laterality Date  . NO PAST SURGERIES      Prior to Admission medications   Medication Sig Start Date End Date Taking? Authorizing Provider  hydrOXYzine (VISTARIL) 100 MG capsule Take 1 capsule (100 mg total) by mouth 3 (three) times daily as needed for itching. 09/04/19   Orvil Feil, PA-C  ondansetron (ZOFRAN ODT) 4 MG disintegrating tablet Allow 1-2 tablets to dissolve in your mouth every 8 hours as needed for nausea/vomiting 08/06/19   Loleta Rose, MD    Allergies Patient has no known allergies.  Family History  Problem Relation Age of Onset  . Endometriosis Maternal Grandmother   . Diabetes Maternal Grandmother   . Endometriosis Mother   . Bipolar  disorder Father   . Asthma Brother   . Bipolar disorder Brother   . Asthma Son   . Seizures Son     Social History Social History   Tobacco Use  . Smoking status: Current Every Day Smoker    Packs/day: 0.25    Years: 9.00    Pack years: 2.25    Types: Cigarettes  . Smokeless tobacco: Never Used  Substance Use Topics  . Alcohol use: No  . Drug use: Yes    Types: Marijuana     Review of Systems  Constitutional: No fever/chills Eyes: No visual changes. No discharge ENT: No upper respiratory complaints. Cardiovascular: Patient has chest pain. Respiratory: no cough. No SOB. Gastrointestinal: No abdominal pain.  No nausea, no vomiting.  No diarrhea.  No constipation. Genitourinary: Negative for dysuria. No hematuria Musculoskeletal: Negative for musculoskeletal pain. Skin: Negative for rash, abrasions, lacerations, ecchymosis. Neurological: Negative for headaches, focal weakness or numbness.   ____________________________________________   PHYSICAL EXAM:  VITAL SIGNS: ED Triage Vitals [09/04/19 1802]  Enc Vitals Group     BP 119/84     Pulse Rate 62     Resp 18     Temp 98.6 F (37 C)     Temp Source Oral     SpO2 100 %     Weight      Height      Head Circumference      Peak Flow      Pain Score  Pain Loc      Pain Edu?      Excl. in [place]?      Constitutional: Alert and oriented. Well appearing and in no acute distress. Eyes: Conjunctivae are normal. PERRL. EOMI. Head: Atraumatic. ENT:      Nose: No congestion/rhinnorhea.      Mouth/Throat: Mucous membranes are moist.  Neck: No stridor.  No cervical spine tenderness to palpation. Cardiovascular: Normal rate, regular rhythm. Normal S1 and S2.  Good peripheral circulation. Respiratory: Normal respiratory effort without tachypnea or retractions. Lungs CTAB. Good air entry to the bases with no decreased or absent breath sounds. Gastrointestinal: Bowel sounds 4 quadrants. Soft and nontender to  palpation. No guarding or rigidity. No palpable masses. No distention. No CVA tenderness. Musculoskeletal: Full range of motion to all extremities. No gross deformities appreciated. Neurologic:  Normal speech and language. No gross focal neurologic deficits are appreciated.  Skin:  Skin is warm, dry and intact. No rash noted. Psychiatric: Mood and affect are normal. Speech and behavior are normal. Patient exhibits appropriate insight and judgement.   ____________________________________________   LABS (all labs ordered are listed, but only abnormal results are displayed)  Labs Reviewed  BASIC METABOLIC PANEL - Abnormal; Notable for the following components:      Result Value   Glucose, Bld 109 (*)    All other components within normal limits  CBC  POC URINE PREG, ED  POCT PREGNANCY, URINE  TROPONIN I (HIGH SENSITIVITY)  TROPONIN I (HIGH SENSITIVITY)   ____________________________________________  EKG   ____________________________________________  RADIOLOGY I personally viewed and evaluated these images as part of my medical decision making, as well as reviewing the written report by the radiologist.  Dg Chest 2 View  Result Date: 09/04/2019 CLINICAL DATA:  Chest pain EXAM: CHEST - 2 VIEW COMPARISON:  None. FINDINGS: Heart and mediastinal contours are within normal limits. No focal opacities or effusions. No acute bony abnormality. IMPRESSION: No active cardiopulmonary disease. Electronically Signed   By: Rolm Baptise M.D.   On: 09/04/2019 19:44    ____________________________________________    PROCEDURES  Procedure(s) performed:    Procedures    Medications  ALPRAZolam Duanne Moron) tablet 0.5 mg (0.5 mg Oral Given 09/04/19 2159)     ____________________________________________   INITIAL IMPRESSION / ASSESSMENT AND PLAN / ED COURSE  Pertinent labs & imaging results that were available during my care of the patient were reviewed by me and considered in my  medical decision making (see chart for details).  Review of the [place] CSRS was performed in accordance of the [place] prior to dispensing any controlled drugs.           Assessment and plan Chest pain 23 year old female presents to the emergency department with midsternal chest pain that is not worsened with exertion.  Patient states that chest pain has been worse this week as she has had multiple panic attacks daily.   Vital signs were reassuring at triage.  On physical exam, patient is resting in a supine position.  There are no murmurs, gallops or rubs.  She has breath sounds in lung bases bilaterally.  Differential diagnosis includes STEMI, arrhythmia, anxiety...  EKG revealed normal sinus rhythm without ischemic changes or apparent arrhythmia.  Both sets of troponin were within reference range.  Pregnancy testing was negative.  Patient reported that her chest pain resolved after Xanax was administered.  She was discharged with hydroxyzine.  Return precautions were given.  All patient questions were answered.   ____________________________________________  FINAL CLINICAL IMPRESSION(S) / ED DIAGNOSES  Final diagnoses:  Chest pain, unspecified type      NEW MEDICATIONS STARTED DURING THIS VISIT:  ED Discharge Orders         Ordered    hydrOXYzine (VISTARIL) 100 MG capsule  3 times daily PRN     09/04/19 2256              This chart was dictated using voice recognition software/Dragon. Despite best efforts to proofread, errors can occur which can change the meaning. Any change was purely unintentional.    Orvil FeilWoods, Vanette Noguchi M, PA-C 09/04/19 2317    Arnaldo NatalMalinda, Paul F, MD 09/05/19 [phone]

## 2019-09-04 NOTE — ED Triage Notes (Signed)
Patient presents to the ED with chest pain and shortness of breath intermittently x 1 week.  Patient reports she is under a large amount of stress.  Patient appears anxious and tearful in triage.  Patient states chest pain is more on the left side and it feels like, "pinches".  Patient states, "I just don't feel right".

## 2020-04-04 ENCOUNTER — Encounter: Payer: Self-pay | Admitting: Family Medicine

## 2020-04-04 ENCOUNTER — Ambulatory Visit: Payer: Medicaid Other | Admitting: Family Medicine

## 2020-04-04 ENCOUNTER — Other Ambulatory Visit: Payer: Self-pay

## 2020-04-04 DIAGNOSIS — N760 Acute vaginitis: Secondary | ICD-10-CM | POA: Diagnosis not present

## 2020-04-04 DIAGNOSIS — B9689 Other specified bacterial agents as the cause of diseases classified elsewhere: Secondary | ICD-10-CM

## 2020-04-04 DIAGNOSIS — Z113 Encounter for screening for infections with a predominantly sexual mode of transmission: Secondary | ICD-10-CM

## 2020-04-04 LAB — WET PREP FOR TRICH, YEAST, CLUE
Trichomonas Exam: NEGATIVE
Yeast Exam: NEGATIVE

## 2020-04-04 MED ORDER — METRONIDAZOLE 500 MG PO TABS: 500.0000 mg | ORAL_TABLET | Freq: Two times a day (BID) | ORAL | 0 refills | Status: AC

## 2020-04-04 NOTE — Progress Notes (Signed)
Wet mount reviewed and pt treated for BV per Larene Pickett, FNP order and per standing order. Provider orders completed.

## 2020-04-04 NOTE — Progress Notes (Signed)
[facility] Department STI clinic/screening visit  Subjective:  Stefanie Sparks is a 24 y.o. female being seen today for an STI screening visit. The patient reports they do have symptoms.  Patient reports that they do not desire a pregnancy in the next year.   They reported they are not interested in discussing contraception today.  No LMP recorded (exact date). Patient has had an injection.   Patient has the following medical conditions:   Patient Active Problem List   Diagnosis Date Noted   Chronic pelvic pain in female 10/02/2018   Migraine 06/17/2011    No chief complaint on file.   HPI  Patient reports she has STD symptoms. She has light odor with clear disch x 2 days.  Client states that she may have BV again. Last HIV test per patient/review of record was 2020 Patient reports last pap was 2016.   See flowsheet for further details and programmatic requirements.    The following portions of the patient's history were reviewed and updated as appropriate: allergies, current medications, past medical history, past social history, past surgical history and problem list.  Objective:  There were no vitals filed for this visit.  Physical Exam Vitals and nursing note reviewed.  Constitutional:      Appearance: Normal appearance.  HENT:     Head: Normocephalic and atraumatic.     Mouth/Throat:     Mouth: Mucous membranes are moist.     Pharynx: Oropharynx is clear. No oropharyngeal exudate or posterior oropharyngeal erythema.  Pulmonary:     Effort: Pulmonary effort is normal.  Abdominal:     General: Abdomen is flat.     Palpations: There is no mass.     Tenderness: There is no abdominal tenderness. There is no rebound.  Genitourinary:    General: Normal vulva.     Exam position: Lithotomy position.     Pubic Area: No rash or pubic lice.      Labia:        Right: No rash or lesion.        Left: No rash or lesion.      Vagina: Vaginal discharge  present. No erythema, bleeding or lesions.     Cervix: No cervical motion tenderness, discharge, friability, lesion or erythema.     Uterus: Normal.      Adnexa: Right adnexa normal and left adnexa normal.     Rectum: Normal.     Comments: Thick, white discharge with fishy odor, pH > 4.5 Lymphadenopathy:     Head:     Right side of head: No preauricular or posterior auricular adenopathy.     Left side of head: No preauricular or posterior auricular adenopathy.     Cervical: No cervical adenopathy.     Upper Body:     Right upper body: No supraclavicular or axillary adenopathy.     Left upper body: No supraclavicular or axillary adenopathy.     Lower Body: No right inguinal adenopathy. No left inguinal adenopathy.  Skin:    General: Skin is warm and dry.     Findings: No rash.  Neurological:     Mental Status: She is alert and oriented to person, place, and time.    Assessment and Plan:  Stefanie Sparks is a 24 y.o. female presenting to the [facility] Department for STI screening  1. Screening examination for venereal disease Treat for BV with Metronidazole 500 mg po bid x  7days - WET PREP  FOR TRICH, YEAST, CLUE - Chlamydia/Gonorrhea [facility]  No follow-ups on file.  No future appointments.  Hassell Done, FNP

## 2020-04-18 ENCOUNTER — Ambulatory Visit (LOCAL_COMMUNITY_HEALTH_CENTER): Payer: Medicaid Other

## 2020-04-18 ENCOUNTER — Other Ambulatory Visit: Payer: Self-pay

## 2020-04-18 VITALS — BP 105/70 | Ht 59.0 in | Wt 116.0 lb

## 2020-04-18 DIAGNOSIS — Z3201 Encounter for pregnancy test, result positive: Secondary | ICD-10-CM

## 2020-04-18 LAB — PREGNANCY, URINE: Preg Test, Ur: POSITIVE — AB

## 2020-04-18 MED ORDER — PRENATAL VITAMIN 27-0.8 MG PO TABS: 1.0000 | ORAL_TABLET | ORAL | 0 refills | Status: AC

## 2020-04-18 NOTE — Progress Notes (Signed)
Patient desires prenatal care at [facility].

## 2020-04-20 ENCOUNTER — Other Ambulatory Visit: Payer: Self-pay

## 2020-04-20 ENCOUNTER — Emergency Department
Admission: EM | Admit: 2020-04-20 | Discharge: 2020-04-21 | Disposition: A | Discharge status: Home / Self Care | Payer: Medicaid Other | Attending: Emergency Medicine | Admitting: Emergency Medicine

## 2020-04-20 ENCOUNTER — Encounter: Payer: Self-pay | Admitting: Emergency Medicine

## 2020-04-20 DIAGNOSIS — O23591 Infection of other part of genital tract in pregnancy, first trimester: Secondary | ICD-10-CM | POA: Diagnosis not present

## 2020-04-20 DIAGNOSIS — R1031 Right lower quadrant pain: Secondary | ICD-10-CM

## 2020-04-20 DIAGNOSIS — Z3A01 Less than 8 weeks gestation of pregnancy: Secondary | ICD-10-CM | POA: Diagnosis not present

## 2020-04-20 DIAGNOSIS — B9689 Other specified bacterial agents as the cause of diseases classified elsewhere: Secondary | ICD-10-CM | POA: Diagnosis not present

## 2020-04-20 DIAGNOSIS — O26891 Other specified pregnancy related conditions, first trimester: Secondary | ICD-10-CM | POA: Diagnosis present

## 2020-04-20 LAB — COMPREHENSIVE METABOLIC PANEL
ALT: 10 U/L (ref 0–44)
AST: 16 U/L (ref 15–41)
Albumin: 4 g/dL (ref 3.5–5.0)
Alkaline Phosphatase: 54 U/L (ref 38–126)
Anion gap: 9 (ref 5–15)
BUN: 12 mg/dL (ref 6–20)
CO2: 23 mmol/L (ref 22–32)
Calcium: 8.8 mg/dL — ABNORMAL LOW (ref 8.9–10.3)
Chloride: 105 mmol/L (ref 98–111)
Creatinine, Ser: 0.72 mg/dL (ref 0.44–1.00)
GFR calc Af Amer: >60 mL/min (ref >60)
GFR calc non Af Amer: >60 mL/min (ref >60)
Glucose, Bld: 119 mg/dL — ABNORMAL HIGH (ref 70–99)
Potassium: 3.9 mmol/L (ref 3.5–5.1)
Sodium: 137 mmol/L (ref 135–145)
Total Bilirubin: 0.5 mg/dL (ref 0.3–1.2)
Total Protein: 6.6 g/dL (ref 6.5–8.1)

## 2020-04-20 LAB — URINALYSIS, COMPLETE (UACMP) WITH MICROSCOPIC
Bacteria, UA: NONE SEEN
Bilirubin Urine: NEGATIVE
Glucose, UA: NEGATIVE mg/dL
Hgb urine dipstick: NEGATIVE
Ketones, ur: NEGATIVE mg/dL
Leukocytes,Ua: NEGATIVE
Nitrite: NEGATIVE
Protein, ur: NEGATIVE mg/dL
Specific Gravity, Urine: 1.025 (ref 1.005–1.030)
pH: 6 (ref 5.0–8.0)

## 2020-04-20 LAB — CBC
HCT: 37.6 % (ref 36.0–46.0)
Hemoglobin: 13 g/dL (ref 12.0–15.0)
MCH: 30.2 pg (ref 26.0–34.0)
MCHC: 34.6 g/dL (ref 30.0–36.0)
MCV: 87.4 fL (ref 80.0–100.0)
Platelets: 233 10*3/uL (ref 150–400)
RBC: 4.3 MIL/uL (ref 3.87–5.11)
RDW: 12 % (ref 11.5–15.5)
WBC: 6.8 10*3/uL (ref 4.0–10.5)
nRBC: 0 % (ref 0.0–0.2)

## 2020-04-20 LAB — HCG, QUANTITATIVE, PREGNANCY: hCG, Beta Chain, Quant, S: 9540 m[IU]/mL — ABNORMAL HIGH (ref <5)

## 2020-04-20 LAB — LIPASE, BLOOD: Lipase: 38 U/L (ref 11–51)

## 2020-04-20 NOTE — ED Triage Notes (Signed)
Patient presents to the ED with right lower quadrant pain that started yesterday.  Patient states pain is worse with palpation.  Patient had a positive pregnancy test 2 days ago.  Patient states she has irregular periods so she does not know how far along she is in this pregnancy.  Patient states pain has been increasing.

## 2020-04-20 NOTE — ED Provider Notes (Addendum)
[facility] Emergency Department Provider Note   ____________________________________________   First MD Initiated Contact with Patient 04/20/20 2302     (approximate)  I have reviewed the triage vital signs and the nursing notes.   HISTORY  Chief Complaint Abdominal Pain    HPI Stefanie Sparks is a 24 y.o. female who presents to the ED from home with a chief complaint of right lower quadrant pelvic pain.  Onset x1 day.  Patient had positive pregnancy 2 days ago.  This would make her G3 P2.  She has irregular periods so she does not know how far she is along.  Other than nausea, patient denies fever, cough, chest pain, shortness of breath, vomiting, dysuria, diarrhea. Denies recent travel or trauma.       Past Medical History:  Diagnosis Date  . Endometriosis   . Gastroenteritis 04/06/2016  . No known health problems     Patient Active Problem List   Diagnosis Date Noted  . Chronic pelvic pain in female 10/02/2018  . Migraine 06/17/2011    Past Surgical History:  Procedure Laterality Date  . NO PAST SURGERIES      Prior to Admission medications   Medication Sig Start Date End Date Taking? Authorizing Provider  hydrOXYzine (VISTARIL) 100 MG capsule Take 1 capsule (100 mg total) by mouth 3 (three) times daily as needed for itching. Patient not taking: Reported on 04/18/2020 09/04/19   Orvil Feil, PA-C  metroNIDAZOLE (METROGEL VAGINAL) 0.75 % vaginal gel Place 1 Applicatorful vaginally at bedtime for 5 days. 04/21/20 04/26/20  Irean Hong, MD  ondansetron (ZOFRAN ODT) 4 MG disintegrating tablet Allow 1-2 tablets to dissolve in your mouth every 8 hours as needed for nausea/vomiting Patient not taking: Reported on 04/18/2020 08/06/19   Loleta Rose, MD    Allergies Other  Family History  Problem Relation Age of Onset  . Endometriosis Maternal Grandmother   . Diabetes Maternal Grandmother   . Endometriosis Mother   . Bipolar disorder  Father   . Asthma Brother   . Bipolar disorder Brother   . Asthma Son   . Seizures Son     Social History Social History   Tobacco Use  . Smoking status: Former Smoker    Packs/day: 0.25    Years: 9.00    Pack years: 2.25    Types: Cigarettes    Quit date: 04/16/2020    Years since quitting: 0.0  . Smokeless tobacco: Never Used  Vaping Use  . Vaping Use: Never used  Substance Use Topics  . Alcohol use: No  . Drug use: Not Currently    Types: Marijuana    Comment: Denies any Marijuana use since October 2020    Review of Systems  Constitutional: No fever/chills Eyes: No visual changes. ENT: No sore throat. Cardiovascular: Denies chest pain. Respiratory: Denies shortness of breath. Gastrointestinal: Positive for right lower quadrant abdominal pain and nausea, no vomiting.  No diarrhea.  No constipation. Genitourinary: Negative for dysuria. Musculoskeletal: Negative for back pain. Skin: Negative for rash. Neurological: Negative for headaches, focal weakness or numbness.   ____________________________________________   PHYSICAL EXAM:  VITAL SIGNS: ED Triage Vitals  Enc Vitals Group     BP 04/20/20 2153 108/65     Pulse Rate 04/20/20 2153 (!) 104     Resp 04/20/20 2153 18     Temp 04/20/20 2153 98.6 F (37 C)     Temp Source 04/20/20 2153 Oral  SpO2 04/20/20 2153 98 %     Weight 04/20/20 2154 116 lb (52.6 kg)     Height 04/20/20 2154 4\' 11"  (1.499 m)     Head Circumference --      Peak Flow --      Pain Score 04/20/20 2153 8     Pain Loc --      Pain Edu? --      Excl. in [place]? --     Constitutional: Alert and oriented. Well appearing and in no acute distress. Eyes: Conjunctivae are normal. PERRL. EOMI. Head: Atraumatic. Nose: No congestion/rhinnorhea. Mouth/Throat: Mucous membranes are moist.  Oropharynx non-erythematous. Neck: No stridor.   Cardiovascular: Normal rate, regular rhythm. Grossly normal heart sounds.  Good peripheral  circulation. Respiratory: Normal respiratory effort.  No retractions. Lungs CTAB. Gastrointestinal: Soft and mildly tender to palpation right lower quadrant without rebound or guarding. No distention. No abdominal bruits. No CVA tenderness. Musculoskeletal: No lower extremity tenderness nor edema.  No joint effusions. Neurologic:  Normal speech and language. No gross focal neurologic deficits are appreciated. No gait instability. Skin:  Skin is warm, dry and intact. No rash noted. Psychiatric: Mood and affect are normal. Speech and behavior are normal.  ____________________________________________   LABS (all labs ordered are listed, but only abnormal results are displayed)  Labs Reviewed  WET PREP, GENITAL - Abnormal; Notable for the following components:      Result Value   Clue Cells Wet Prep HPF POC PRESENT (*)    WBC, Wet Prep HPF POC FEW (*)    All other components within normal limits  COMPREHENSIVE METABOLIC PANEL - Abnormal; Notable for the following components:   Glucose, Bld 119 (*)    Calcium 8.8 (*)    All other components within normal limits  URINALYSIS, COMPLETE (UACMP) WITH MICROSCOPIC - Abnormal; Notable for the following components:   APPearance HAZY (*)    All other components within normal limits  HCG, QUANTITATIVE, PREGNANCY - Abnormal; Notable for the following components:   hCG, Beta Chain, Quant, S 9,540 (*)    All other components within normal limits  CHLAMYDIA/NGC RT PCR ([facility] ONLY)  LIPASE, BLOOD  CBC  POC URINE PREG, ED   ____________________________________________  EKG  None ____________________________________________  RADIOLOGY  ED MD interpretation: IU gestational sac, probable right corpus luteum cyst  Official radiology report(s): 2154 OB LESS THAN 14 WEEKS WITH OB TRANSVAGINAL  Result Date: 04/21/2020 CLINICAL DATA:  Beta HCG 9,540, right pelvic pain since yesterday EXAM: OBSTETRIC <14 WK 06/22/2020 AND TRANSVAGINAL OB US TECHNIQUE:  Both transabdominal and transvaginal ultrasound examinations were performed for complete evaluation of the gestation as well as the maternal uterus, adnexal regions, and pelvic cul-de-sac. Transvaginal technique was performed to assess early pregnancy. COMPARISON:  None. FINDINGS: Intrauterine gestational sac: Single Yolk sac:  Visualized. Embryo:  Not Visualized. Cardiac Activity: Not Visualized. MSD: 6.9 mm   5 w   3 d Subchorionic hemorrhage:  None visualized. Maternal uterus/adnexae: Probable corpus luteum cyst within the right ovary measures up to 2.1 cm. Normal follicles are seen elsewhere within the right ovary. Left ovary is grossly normal. There is trace free fluid within the pelvis. IMPRESSION: 1. Single intrauterine gestational sac as above. Fetal pole is not yet visualized. Serial beta HCG measurements and follow-up ultrasound would be needed to document a live intrauterine pregnancy. 2. Probable corpus luteum cyst within the right adnexa. Electronically Signed   By: [place] M.D.   On: 04/21/2020 03:11  ____________________________________________   PROCEDURES  Procedure(s) performed (including Critical Care):  Procedures   ____________________________________________   INITIAL IMPRESSION / ASSESSMENT AND PLAN / ED COURSE  As part of my medical decision making, I reviewed the following data within the electronic MEDICAL RECORD NUMBER Nursing notes reviewed and incorporated, Labs reviewed, Old chart reviewed, Radiograph reviewed and Notes from prior ED visits     Lynessa T Behnke was evaluated in Emergency Department on 04/21/2020 for the symptoms described in the history of present illness. She was evaluated in the context of the global COVID-19 pandemic, which necessitated consideration that the patient might be at risk for infection with the SARS-CoV-2 virus that causes COVID-19. Institutional protocols and algorithms that pertain to the evaluation of patients at risk for  COVID-19 are in a state of rapid change based on information released by regulatory bodies including the CDC and federal and state organizations. These policies and algorithms were followed during the patient's care in the ED.    24 year old G3, P2 presenting with right adnexal pain and positive home pregnancy test. Differential diagnosis includes, but is not limited to, ovarian cyst, ovarian torsion, acute appendicitis, diverticulitis, urinary tract infection/pyelonephritis, endometriosis, bowel obstruction, colitis, renal colic, gastroenteritis, hernia, fibroids, endometriosis, pregnancy related pain including ectopic pregnancy, etc.  Laboratory results unremarkable.  Beta hCG 9540.  Will proceed with OB ultrasound.   Clinical Course as of Apr 21 326  Fri Apr 21, 2020  0307 Patient has to leave because her babysitter needs to go home.  Apologized for the delay in ultrasound.  Patient will review results in MyChart.  I told the patient if the ultrasound is significantly abnormal then we will call her back by telephone.  Strict return precautions given.  Patient verbalizes understanding and agrees with plan of care.   [JS]  0327 Addendum on chart review: Noted results of ultrasound.   [JS]    Clinical Course User Index [JS] Irean Hong, MD     ____________________________________________   FINAL CLINICAL IMPRESSION(S) / ED DIAGNOSES  Final diagnoses:  Bacterial vaginosis in pregnancy     ED Discharge Orders         Ordered    metroNIDAZOLE (METROGEL VAGINAL) 0.75 % vaginal gel  Daily at bedtime     Discontinue  Reprint     04/21/20 0304           Note:  This document was prepared using Dragon voice recognition software and may include unintentional dictation errors.   Irean Hong, MD 04/21/20 Theodoro Kos    Irean Hong, MD 04/21/20 [phone]

## 2020-04-21 ENCOUNTER — Emergency Department: Payer: Medicaid Other

## 2020-04-21 LAB — CHLAMYDIA/NGC RT PCR (ARMC ONLY)
Chlamydia Tr: NOT DETECTED
N gonorrhoeae: NOT DETECTED

## 2020-04-21 LAB — POCT PREGNANCY, URINE: Preg Test, Ur: POSITIVE — AB

## 2020-04-21 LAB — WET PREP, GENITAL
Sperm: NONE SEEN
Trich, Wet Prep: NONE SEEN
Yeast Wet Prep HPF POC: NONE SEEN

## 2020-04-21 MED ORDER — METRONIDAZOLE 0.75 % VA GEL: 1.0000 | Freq: Every day | VAGINAL | 0 refills | Status: AC

## 2020-04-21 NOTE — Discharge Instructions (Addendum)
1.  Use MetroGel nightly x5 nights as directed. 2.  You may check my chart for the results of your ultrasound.  If your ultrasound is abnormal, we will notify you by phone. 3.  Return to the ER for worsening symptoms, persistent vomiting, difficulty breathing or other concerns.

## 2020-05-04 ENCOUNTER — Ambulatory Visit (INDEPENDENT_AMBULATORY_CARE_PROVIDER_SITE_OTHER): Payer: Medicaid Other | Admitting: Certified Nurse Midwife

## 2020-05-04 ENCOUNTER — Other Ambulatory Visit: Payer: Self-pay | Admitting: Certified Nurse Midwife

## 2020-05-04 ENCOUNTER — Other Ambulatory Visit: Payer: Self-pay

## 2020-05-04 ENCOUNTER — Ambulatory Visit (INDEPENDENT_AMBULATORY_CARE_PROVIDER_SITE_OTHER): Payer: Medicaid Other

## 2020-05-04 ENCOUNTER — Encounter: Payer: Self-pay | Admitting: Certified Nurse Midwife

## 2020-05-04 VITALS — BP 100/70 | Ht 59.0 in | Wt 114.4 lb

## 2020-05-04 DIAGNOSIS — Z3491 Encounter for supervision of normal pregnancy, unspecified, first trimester: Secondary | ICD-10-CM

## 2020-05-04 DIAGNOSIS — Z3A01 Less than 8 weeks gestation of pregnancy: Secondary | ICD-10-CM

## 2020-05-04 DIAGNOSIS — F17200 Nicotine dependence, unspecified, uncomplicated: Secondary | ICD-10-CM

## 2020-05-04 LAB — POCT URINALYSIS DIPSTICK OB
Glucose, UA: NEGATIVE
POC,PROTEIN,UA: NEGATIVE

## 2020-05-04 NOTE — Progress Notes (Signed)
New Obstetric Patient H&P    Chief Complaint: "Desires prenatal care"   History of Present Illness: Stefanie Sparks is a 24 y.o. [id number] Not Hispanic or Latino female, LMP 03/19/2020 presents with amenorrhea and positive home pregnancy test. Based on her  LMP, her EDD is Estimated Date of Delivery: 12/24/20 and her EGA is [redacted]w[redacted]d. She had started Depo Provera in July 2020, after which her menses stopped, then she had spotting in October and menses restarted 2x/month in February. Her periods became more regular in April and she had another menses a month later in May which was her LMP.  Her last pap smear was 1 year ago at [facility] and was normal per patient report, but I am unable to find that result..    She had a urine pregnancy test which was positive 04/19/2019.  Her last menstrual period was shorter and lasted for  4 day(s). Since her LMP she claims she has experienced rhinorrhea, nasal congestion, cough and sneezing. She was seen in the ER 7/2 for stabbing RLQ pain. An ultrasound revealed an ing 5wk 3 days, but no fetal pole was seen and there were no masses in the right adnexa (corpus luteum cyst was on right). . She denies vaginal bleeding. Her past medical history is remarkable for anxiety, frequent UTIs, tobacco use disorder, and migraine headaches. Her prior pregnancies are remarkable for able for two term SVDs in 2012 and 2017. Pelvis proven to 7#1oz.  Since her LMP, she admits to the use of tobacco products  Yes, was smoking 1.5 PPD and now smoking 3-4 cigarettes/day.  Since her LMP, , she drank alcohol 1 week before her +UPT.  Has a history of MJ use. Last used in October 2020. She claims she has lost   2 pounds since the start of her pregnancy.  There are cats in the home in the home  yes If yes Outdoor She admits close contact with children on a regular basis  yes  She has had chicken pox in the past no She has had Tuberculosis exposures, symptoms, or previously tested positive for  TB   no Current or past history of domestic violence. no  Genetic Screening/Teratology Counseling: (Includes patient, baby's father, or anyone in either family with:)   1. Patient's age >/= 75 [facility]  no 2. Thalassemia ([place], [place], Mediterranean, or Asian background): MCV<80  no 3. Neural tube defect (meningomyelocele, spina bifida, anencephaly)  no 4. Congenital heart defect  no  5. Down syndrome  no 6. Tay-Sachs (Jewish, [place]  no 7. Canavan's Disease  no 8. Sickle cell disease or trait (African)  no  9. Hemophilia or other blood disorders  no  10. Muscular dystrophy  no  11. Cystic fibrosis  no  12. Huntington's Chorea  no  13. Mental retardation/autism  no 14. Other inherited genetic or chromosomal disorder  no 15. Maternal metabolic disorder (DM, PKU, etc)  no 16. Patient or FOB with a child with a birth defect not listed above no  16a. Patient or FOB with a birth defect themselves no 17. Recurrent pregnancy loss, or stillbirth  no  18. Any medications since LMP other than prenatal vitamins (include vitamins, supplements, OTC meds, drugs, alcohol)  Yes, Robitussin, Vicks, Metrogel vaginal gel. 19. Any other genetic/environmental exposure to discuss  no  Infection History:   1. Lives with someone with TB or TB exposed  no  2. Patient or partner has history of  genital herpes  no 3. Rash or viral illness since LMP  allergies vs URI 4. History of STI (GC, CT, HPV, syphilis, HIV)  Yes, GC and Chlamydia in 2015 5. History of recent travel :  no  Other pertinent information:  Yes, FOB is Demitri, age 34. He is the father of her second child.      Review of Systems:Review of Systems  Constitutional: Negative for chills, fever and weight loss.  HENT: Positive for congestion. Negative for sinus pain and sore throat.   Eyes: Negative for blurred vision and pain.  Respiratory: Positive for cough. Negative for hemoptysis, shortness of breath and wheezing.    Cardiovascular: Negative for chest pain, palpitations and leg swelling.  Gastrointestinal: Negative for abdominal pain, blood in stool, diarrhea, heartburn, nausea and vomiting.  Genitourinary: Negative for dysuria, frequency, hematuria and urgency.  Musculoskeletal: Negative for back pain, joint pain and myalgias.  Skin: Negative for itching and rash.  Neurological: Negative for dizziness, tingling and headaches.  Endo/Heme/Allergies: Positive for environmental allergies (with sneezing and coughing). Negative for polydipsia. Does not bruise/bleed easily.       Negative for hirsutism   Psychiatric/Behavioral: Negative for depression. The patient is nervous/anxious. The patient does not have insomnia.     Past Medical History:  Past Medical History:  Diagnosis Date  . Endometriosis   . Gastroenteritis 04/06/2016  . Migraine     Past Surgical History:  Past Surgical History:  Procedure Laterality Date  . WISDOM TOOTH EXTRACTION      Gynecologic History: Patient's last menstrual period was 03/19/2020 (exact date).  Obstetric History: [id number]  Family History:  Family History  Problem Relation Age of Onset  . Endometriosis Maternal Grandmother   . Diabetes Maternal Grandmother   . Endometriosis Mother   . Bipolar disorder Father   . Asthma Brother   . Bipolar disorder Brother   . Asthma Son   . Seizures Son   . Breast cancer Maternal Great-grandmother     Social History:  Social History   Socioeconomic History  . Marital status: Significant Other    Spouse name: Demitri  . Number of children: 2  . Years of education: Not on file  . Highest education level: Not on file  Occupational History  . Not on file  Tobacco Use  . Smoking status: Current Every Day Smoker    Packs/day: 0.25    Years: 9.00    Pack years: 2.25    Types: Cigarettes    Last attempt to quit: 04/16/2020    Years since quitting: 0.0  . Smokeless tobacco: Never Used  Vaping Use  . Vaping Use:  Never used  Substance and Sexual Activity  . Alcohol use: Not Currently  . Drug use: Not Currently    Types: Marijuana    Comment: Denies any Marijuana use since October 2020  . Sexual activity: Yes    Partners: Male    Birth control/protection: None  Other Topics Concern  . Not on file  Social History Narrative  . Not on file   Social Determinants of Health   Financial Resource Strain:   . Difficulty of Paying Living Expenses:   Food Insecurity:   . Worried About Programme researcher, broadcasting/film/video in the Last Year:   . Barista in the Last Year:   Transportation Needs:   . Freight forwarder (Medical):   Marland Kitchen Lack of Transportation (Non-Medical):   Physical Activity:   . Days of Exercise per  Week:   . Minutes of Exercise per Session:   Stress:   . Feeling of Stress :   Social Connections:   . Frequency of Communication with Friends and Family:   . Frequency of Social Gatherings with Friends and Family:   . Attends Religious Services:   . Active Member of Clubs or Organizations:   . Attends Banker Meetings:   Marland Kitchen Marital Status:   Intimate Partner Violence: Not At Risk  . Fear of Current or Ex-Partner: No  . Emotionally Abused: No  . Physically Abused: No  . Sexually Abused: No    Allergies:  Allergies  Allergen Reactions  . Other Other (See Comments)    oranges Uncoded Allergy. Allergen: Shellfish, Other Reaction: Other reaction    Medications:  Current Outpatient Medications:  .  Prenatal Vit-Fe Fumarate-FA (MULTIVITAMIN-PRENATAL) 27-0.8 MG TABS tablet, Take 1 tablet by mouth daily at 12 noon., Disp: , Rfl:  Physical Exam Vitals: BP 100/70   Ht 4\' 11"  (1.499 m)   Wt 114 lb 6.4 oz (51.9 kg)   LMP 03/19/2020 (Exact Date)   BMI 23.11 kg/m .  General: WF in NAD HEENT: normocephalic, anicteric Thyroid: no enlargement, no palpable nodules Pulmonary: No increased work of breathing, CTAB Breasts: soft, non tender, no masses Cardiovascular: RRR,  without murmur Abdomen: soft, non-tender, non-distended.  Umbilicus without lesions.  No hepatomegaly  or masses palpable. No evidence of hernia  Genitourinary:  External: Normal external female genitalia.  Normal urethral meatus, normal Bartholin's and Skene's glands.    Vagina: Normal vaginal mucosa, no evidence of prolapse.    Cervix: Grossly normal in appearance, no bleeding  Uterus: AV,  Non-enlarged, mobile, normal contour.   Adnexa: ovaries non-enlarged, no adnexal masses  Rectal: deferred Extremities: no edema, erythema, or tenderness Neurologic: Grossly intact Psychiatric: mood appropriate, affect full   Assessment: 24 y.o. G3P2002 at [redacted]w[redacted]d presenting to initiate prenatal care Tobacco use disorder  Plan: 1) Avoid alcoholic beverages. 2) Patient encouraged not to smoke. Praised for ctting down on smoking. 3) Discontinue the use of all non-medicinal drugs and chemicals.  4) Take prenatal vitamins daily.  5) Nutrition, food safety (fish, cheese advisories, and high nitrite foods) and exercise discussed. 6) Hospital and practice style discussed with cross coverage system.  7) Genetic Screening, such as with 1st Trimester Screening, cell free fetal DNA, AFP testing, and Ultrasound, is discussed with patient. At the conclusion of today's visit patient requested genetic testing (cell free DNA testing) 8) Patient is asked about travel to areas at risk for the Zika virus, and counseled to avoid travel and exposure to mosquitoes or sexual partners who may have themselves been exposed to the virus. Testing is discussed, and will be ordered as appropriate.  9) Ultrasound done today for viability: SIUP with CRL c/w 7wk1d FCA 142. Will keep EDC based on LMP 10) RTO in 4 weeks for ROB and genetic screening. [facility] labs today  [redacted]w[redacted]d, CNM

## 2020-05-06 LAB — RPR+RH+ABO+RUB AB+AB SCR+CB...
Antibody Screen: NEGATIVE
HIV Screen 4th Generation wRfx: NONREACTIVE
Hematocrit: 39.8 % (ref 34.0–46.6)
Hemoglobin: 13.5 g/dL (ref 11.1–15.9)
Hepatitis B Surface Ag: NEGATIVE
MCH: 29.9 pg (ref 26.6–33.0)
MCHC: 33.9 g/dL (ref 31.5–35.7)
MCV: 88 fL (ref 79–97)
Platelets: 270 10*3/uL (ref 150–450)
RBC: 4.52 x10E6/uL (ref 3.77–5.28)
RDW: 12 % (ref 11.7–15.4)
RPR Ser Ql: NONREACTIVE
Rh Factor: POSITIVE
Rubella Antibodies, IGG: 4.74 index (ref >0.99)
Varicella zoster IgG: 164 index — ABNORMAL LOW (ref >165)
WBC: 7.4 10*3/uL (ref 3.4–10.8)

## 2020-05-07 ENCOUNTER — Encounter: Payer: Self-pay | Admitting: Certified Nurse Midwife

## 2020-05-07 DIAGNOSIS — Z3483 Encounter for supervision of other normal pregnancy, third trimester: Secondary | ICD-10-CM | POA: Insufficient documentation

## 2020-05-07 DIAGNOSIS — F17200 Nicotine dependence, unspecified, uncomplicated: Secondary | ICD-10-CM | POA: Insufficient documentation

## 2020-05-07 DIAGNOSIS — Z3480 Encounter for supervision of other normal pregnancy, unspecified trimester: Secondary | ICD-10-CM | POA: Insufficient documentation

## 2020-05-08 ENCOUNTER — Encounter: Payer: Self-pay | Admitting: Certified Nurse Midwife

## 2020-05-10 ENCOUNTER — Telehealth: Payer: Self-pay

## 2020-05-10 NOTE — Telephone Encounter (Signed)
Pt calling for rx of zofran as n/v is really bad again.  [phone]

## 2020-05-11 ENCOUNTER — Other Ambulatory Visit: Payer: Self-pay | Admitting: Certified Nurse Midwife

## 2020-05-11 MED ORDER — ONDANSETRON 4 MG PO TBDP
4.0000 mg | ORAL_TABLET | Freq: Four times a day (QID) | ORAL | 1 refills | Status: DC | PRN
Start: 2020-05-11 — End: 2020-05-25

## 2020-05-11 NOTE — Telephone Encounter (Signed)
Spoke with patient. Advised that Zofran is not usually first line antiemetic in first trimester. She states that with previous pregnancy the only medication that worked was Zofran. Dicoegis made her feel worse. Declines Reglan and phenergan. Zofran 4 mgm every 6 prn called to pharmacy. [name], CNM

## 2020-05-25 ENCOUNTER — Other Ambulatory Visit: Payer: Self-pay | Admitting: Advanced Practice Midwife

## 2020-05-25 ENCOUNTER — Telehealth: Payer: Self-pay

## 2020-05-25 DIAGNOSIS — O219 Vomiting of pregnancy, unspecified: Secondary | ICD-10-CM

## 2020-05-25 MED ORDER — ONDANSETRON HCL 4 MG PO TABS: 4.0000 mg | ORAL_TABLET | Freq: Three times a day (TID) | ORAL | 2 refills | Status: DC | PRN

## 2020-05-25 NOTE — Telephone Encounter (Signed)
Sent new Rx for zofran tablets instead of ODT zofran.

## 2020-05-25 NOTE — Telephone Encounter (Signed)
Pt calling; the disintegrating zofran tablets are not working as well for her as the regular tablets do.  Please call to discuss optioins.   [phone]

## 2020-05-25 NOTE — Progress Notes (Signed)
Rx tablets sent per patient request.

## 2020-05-26 NOTE — Telephone Encounter (Signed)
Pt aware.

## 2020-06-01 ENCOUNTER — Ambulatory Visit (INDEPENDENT_AMBULATORY_CARE_PROVIDER_SITE_OTHER): Payer: Medicaid Other | Admitting: Advanced Practice Midwife

## 2020-06-01 ENCOUNTER — Encounter: Payer: Self-pay | Admitting: Advanced Practice Midwife

## 2020-06-01 ENCOUNTER — Other Ambulatory Visit: Payer: Self-pay

## 2020-06-01 VITALS — BP 112/74 | Wt 120.0 lb

## 2020-06-01 DIAGNOSIS — Z3491 Encounter for supervision of normal pregnancy, unspecified, first trimester: Secondary | ICD-10-CM

## 2020-06-01 DIAGNOSIS — Z3A10 10 weeks gestation of pregnancy: Secondary | ICD-10-CM

## 2020-06-01 NOTE — Patient Instructions (Signed)

## 2020-06-01 NOTE — Progress Notes (Signed)
  Routine Prenatal Care Visit  Subjective  Stefanie Sparks is a 24 y.o. G3P2002 at [redacted]w[redacted]d being seen today for ongoing prenatal care.  She is currently monitored for the following issues for this low-risk pregnancy and has Chronic pelvic pain in female; Migraine; Supervision of low-risk pregnancy, first trimester; and Tobacco use disorder on their problem list.  ----------------------------------------------------------------------------------- Patient reports no complaints.    . Vag. Bleeding: None.   . Leaking Fluid denies.  ----------------------------------------------------------------------------------- The following portions of the patient's history were reviewed and updated as appropriate: allergies, current medications, past family history, past medical history, past social history, past surgical history and problem list. Problem list updated.  Objective  Blood pressure 112/74, weight 120 lb (54.4 kg), last menstrual period 03/19/2020. Pregravid weight 116 lb (52.6 kg) Total Weight Gain 4 lb (1.814 kg) Urinalysis: Urine Protein    Urine Glucose    Fetal Status: Fetal Heart Rate (bpm): 164         General:  Alert, oriented and cooperative. Patient is in no acute distress.  Skin: Skin is warm and dry. No rash noted.   Cardiovascular: Normal heart rate noted  Respiratory: Normal respiratory effort, no problems with respiration noted  Abdomen: Soft, gravid, appropriate for gestational age.       Pelvic:  Cervical exam deferred        Extremities: Normal range of motion.     Mental Status: Normal mood and affect. Normal behavior. Normal judgment and thought content.   Assessment   24 y.o. [id number] at [redacted]w[redacted]d by  12/24/2020, Date entered prior to episode creation presenting for routine prenatal visit  Plan   MaterniT 21 today Urine culture and UDS collected from today's specimen (ordered at [facility])  Preterm labor symptoms and general obstetric precautions including but not limited to  vaginal bleeding, contractions, leaking of fluid and fetal movement were reviewed in detail with the patient. Please refer to After Visit Summary for other counseling recommendations.   Return in about 4 weeks (around 06/29/2020) for rob.  Tresea Mall, CNM 06/01/2020 3:50 PM

## 2020-06-01 NOTE — Progress Notes (Signed)
No vb. No lof.  

## 2020-06-04 LAB — URINE CULTURE

## 2020-06-06 LAB — MATERNIT 21 PLUS CORE, BLOOD
Fetal Fraction: 12
Result (T21): NEGATIVE
Trisomy 13 (Patau syndrome): NEGATIVE
Trisomy 18 (Edwards syndrome): NEGATIVE
Trisomy 21 (Down syndrome): NEGATIVE

## 2020-06-09 LAB — URINE DRUG PANEL 7

## 2020-06-14 LAB — URINE DRUG PANEL 7
Amphetamines, Urine: NEGATIVE ng/mL
Barbiturate Quant, Ur: NEGATIVE ng/mL
Benzodiazepine Quant, Ur: NEGATIVE ng/mL
Cannabinoid Quant, Ur: POSITIVE — AB
Cocaine (Metab.): NEGATIVE ng/mL
Opiate Quant, Ur: NEGATIVE ng/mL
PCP Quant, Ur: NEGATIVE ng/mL

## 2020-06-14 LAB — SPECIMEN STATUS REPORT

## 2020-06-27 ENCOUNTER — Telehealth: Payer: Self-pay

## 2020-06-27 NOTE — Telephone Encounter (Signed)
Pt calling; doesn't understand results in MyChart. [phone]  Pt wasn't sure if gender is female or not.  Adv it is a girl.

## 2020-06-29 ENCOUNTER — Encounter: Payer: Medicaid Other | Admitting: Obstetrics & Gynecology

## 2020-06-30 ENCOUNTER — Other Ambulatory Visit: Payer: Self-pay

## 2020-06-30 ENCOUNTER — Encounter: Payer: Self-pay | Admitting: Obstetrics & Gynecology

## 2020-06-30 ENCOUNTER — Ambulatory Visit (INDEPENDENT_AMBULATORY_CARE_PROVIDER_SITE_OTHER): Payer: Medicaid Other | Admitting: Obstetrics & Gynecology

## 2020-06-30 VITALS — BP 100/60 | Wt 118.0 lb

## 2020-06-30 DIAGNOSIS — Z3689 Encounter for other specified antenatal screening: Secondary | ICD-10-CM

## 2020-06-30 DIAGNOSIS — Z3491 Encounter for supervision of normal pregnancy, unspecified, first trimester: Secondary | ICD-10-CM

## 2020-06-30 DIAGNOSIS — Z3A14 14 weeks gestation of pregnancy: Secondary | ICD-10-CM

## 2020-06-30 NOTE — Progress Notes (Signed)
  Subjective  Fetal Movement? no Contractions? no Leaking Fluid? no Vaginal Bleeding? No Pt has reduced cigarette use from 1 PPD to 1-2 cig/day  Objective  BP 100/60   Wt 118 lb (53.5 kg)   LMP 03/19/2020 (Exact Date)   BMI 23.83 kg/m  General: NAD Pumonary: no increased work of breathing Abdomen: gravid, non-tender Extremities: no edema Psychiatric: mood appropriate, affect full  Assessment  24 y.o. D3O6712 at [redacted]w[redacted]d by  12/24/2020, Date entered prior to episode creation presenting for routine prenatal visit  Plan   Problem List Items Addressed This Visit      Other   Supervision of low-risk pregnancy, first trimester    Other Visit Diagnoses    [redacted] weeks gestation of pregnancy    -  Primary   Screening, antenatal, for fetal anatomic survey       Relevant Orders   US OB Comp + 14 Wk      pregnancy 3 Problems (from 05/04/20 to present)    Problem Noted Resolved   Supervision of low-risk pregnancy, first trimester 05/07/2020 by [name], CNM No   Overview Addendum 06/30/2020  1:34 PM by Nadara Mustard, MD    Clinic [facility] Prenatal Labs  Dating LMP Blood type: O/Positive/-- (07/15 1430)   Genetic Screen NIPS:nml XX Antibody:Negative (07/15 1430)  Anatomic [place]  Rubella: 4.74 (07/15 1430) Varicella:Equivocal  GTT Third trimester:  RPR: Non Reactive (07/15 1430)   Rhogam n/a HBsAg: Negative (07/15 1430)   TDaP vaccine                  Flu Shot: HIV: Non Reactive (07/15 1430)   Baby Food                                GBS:   Contraception  WPY:KDXIP pp  CBB  no   CS/VBAC n/a   Support Person           Previous Version     Discussed smoking cessation and its importance       To try weaning rest the way off of smoking, possibly w brief help of nic gum       Option of nic patches (2 weeks, 7 mg daily patch) discussed as next option   [place] nv  Annamarie Major, MD, [facility], [facility] 06/30/2020  1:52 PM

## 2020-06-30 NOTE — Patient Instructions (Signed)
Prenatal Ultrasound A prenatal ultrasound exam, also called a sonogram, is an imaging test that allows your health care provider to see your baby and placenta in the uterus. This is a safe and painless test that does not expose you or your baby to any X-rays, needles, or medicines. Prenatal ultrasounds are done using a handheld plastic device (transducer) that sends out sound waves (ultrasound). The sound waves reflect off your baby's bones and other tissues to create moving images on a computer screen. There are two types of prenatal ultrasound:  Transabdominal ultrasound. During this test, a transducer is placed on your belly and moved around. A routine transabdominal ultrasound is usually done between weeks 18 and 22 of pregnancy (standard ultrasound). It may also be done between weeks 13 and 14.  Transvaginal ultrasound. During this test, a transducer that is shaped like a wand is placed inside your vagina. This type of ultrasound is usually done during early pregnancy. Prenatal ultrasounds may be used to check:  How far along your pregnancy is (stage).  Your baby's development (gestational age).  The location and condition of the organ that supplies your baby with nourishment and oxygen (placenta).  Your baby's heart rate, position, and movements.  Your baby's approximate size and weight.  The amount of fluid surrounding your baby (amniotic fluid).  If you are carrying more than one baby.  Your baby's sex (if your baby is in a position that allows the sex organs to be seen, and if you choose to learn the sex at this time).  If there are any possible problems that require more testing, such as genetic problems.  If your pregnancy is forming outside your uterus (ectopic pregnancy). You may have other ultrasounds as needed at any point during your pregnancy. If your health care provider suspects a problem, you may also have a more detailed type of transabdominal ultrasound (advanced  ultrasound). What are the risks? Generally, this is a safe test. There are no known risks for you or your baby from a prenatal ultrasound. What happens before the test?  Before a transabdominal ultrasound, you may be asked to drink fluid 2 hours before the exam and avoid emptying your bladder. A full bladder helps the images show up more clearly.  Before a transvaginal ultrasound, you may be asked to empty your bladder before the exam.  Wear loose, comfortable clothing so it is easy to undress or expose your lower belly for the exam. What happens during the test? If you are having a transabdominal ultrasound:  You will lie on an exam table.  Your belly will be exposed.  Gel will be rubbed over your belly.  The transducer will be pressed on your belly and moved back and forth, through the gel. You may feel slight pressure, but there should not be any pain.  You may be asked to change your position.  You may hear sounds of blood flow and your baby's heartbeat. You may be able to see images of your baby on the computer screen. Your health care provider may measure your baby's head and other body parts, looking for normal development.  After the exam, the gel will be cleaned off, and you can replace your clothing. You will be able to empty your bladder after the exam is done. If you are having a transvaginal ultrasound:  You will change into a hospital gown or undress from the waist down and cover yourself with a paper sheet.  You will lie down on   an exam table with your feet in footrests (stirrups).  The transducer will be covered with a protective cover and lubricated.  The transducer will be inserted into your vagina.  You may hear sounds of blood flow and your baby's heartbeat. You may be able to see images of your baby on the computer screen.  After the exam, the transducer will be removed, and you can put your clothes back on. What can I expect after the test?  You can  drive yourself home and return to all your normal activities.  A health care provider trained in interpreting ultrasounds will review the images taken during your exam and send a report to your health care provider.  It is up to you to get your test results. Ask your health care provider, or the department that is doing the test, when your results will be ready. Questions to ask your health care provider  Why am I having this prenatal ultrasound?  What information will this exam provide?  How much does this exam cost? What costs will my insurance cover?  Can my partner or support person be with me during the exam?  When can I expect to get the results? Summary  A prenatal ultrasound is a safe and painless imaging exam that gives information about your pregnancy and your developing baby.  Transvaginal ultrasound exams are often done in early pregnancy. Standard transabdominal ultrasounds are typically done between 18 and 22 weeks of pregnancy. You may have other prenatal ultrasounds as needed.  This exam has no risks for you or your baby. After the exam, you can go home and return to all your usual activities. This information is not intended to replace advice given to you by your health care provider. Make sure you discuss any questions you have with your health care provider. Document Revised: 01/29/2019 Document Reviewed: 12/10/2017 Elsevier Patient Education  2020 Elsevier Inc.  

## 2020-07-21 ENCOUNTER — Inpatient Hospital Stay (HOSPITAL_COMMUNITY)
Admission: AD | Admit: 2020-07-21 | Discharge: 2020-07-21 | Disposition: A | Discharge status: Home / Self Care | Payer: Medicaid Other | Attending: Obstetrics and Gynecology | Admitting: Obstetrics and Gynecology

## 2020-07-21 ENCOUNTER — Other Ambulatory Visit: Payer: Self-pay

## 2020-07-21 ENCOUNTER — Emergency Department
Admission: EM | Admit: 2020-07-21 | Discharge: 2020-07-21 | Discharge status: Against Medical Advice | Payer: Medicaid Other

## 2020-07-21 ENCOUNTER — Telehealth: Payer: Self-pay

## 2020-07-21 DIAGNOSIS — Z3491 Encounter for supervision of normal pregnancy, unspecified, first trimester: Secondary | ICD-10-CM

## 2020-07-21 DIAGNOSIS — O99612 Diseases of the digestive system complicating pregnancy, second trimester: Secondary | ICD-10-CM | POA: Diagnosis not present

## 2020-07-21 DIAGNOSIS — Z6790 Unspecified blood type, Rh positive: Secondary | ICD-10-CM

## 2020-07-21 DIAGNOSIS — Z3A17 17 weeks gestation of pregnancy: Secondary | ICD-10-CM | POA: Diagnosis not present

## 2020-07-21 DIAGNOSIS — O26852 Spotting complicating pregnancy, second trimester: Secondary | ICD-10-CM

## 2020-07-21 DIAGNOSIS — K5901 Slow transit constipation: Secondary | ICD-10-CM

## 2020-07-21 DIAGNOSIS — F17210 Nicotine dependence, cigarettes, uncomplicated: Secondary | ICD-10-CM | POA: Diagnosis not present

## 2020-07-21 DIAGNOSIS — O209 Hemorrhage in early pregnancy, unspecified: Secondary | ICD-10-CM | POA: Insufficient documentation

## 2020-07-21 DIAGNOSIS — O99332 Smoking (tobacco) complicating pregnancy, second trimester: Secondary | ICD-10-CM | POA: Diagnosis not present

## 2020-07-21 LAB — URINALYSIS, ROUTINE W REFLEX MICROSCOPIC
Bilirubin Urine: NEGATIVE
Glucose, UA: NEGATIVE mg/dL
Hgb urine dipstick: NEGATIVE
Ketones, ur: NEGATIVE mg/dL
Leukocytes,Ua: NEGATIVE
Nitrite: NEGATIVE
Protein, ur: NEGATIVE mg/dL
Specific Gravity, Urine: 1.005 (ref 1.005–1.030)
pH: 7 (ref 5.0–8.0)

## 2020-07-21 LAB — WET PREP, GENITAL
Clue Cells Wet Prep HPF POC: NONE SEEN
Sperm: NONE SEEN
Trich, Wet Prep: NONE SEEN
Yeast Wet Prep HPF POC: NONE SEEN

## 2020-07-21 NOTE — MAU Note (Signed)
Pt reports she noticed some brown blood in her underwear today and when she wipes. Happened several times. Also reports some mild cramping since the bleeding. Denies any recent intercourse.

## 2020-07-21 NOTE — Telephone Encounter (Signed)
Pt calling; 18wks; started bleeding an hour ago.  [phone]  Pt states 2d ago she bent over to p/u something from a bottom shelf at the grocery store and felt a pop like lightning bolt going thru her vag area.  No IC within 24hrs of this starting.  States the flow is like a period and it's dark.

## 2020-07-21 NOTE — Discharge Instructions (Signed)
Vaginal Bleeding During Pregnancy, Second Trimester  A small amount of bleeding (spotting) from the vagina is common during pregnancy. Sometimes the bleeding is normal and is not a sign of problems. In some other cases, it is a sign of something serious. Tell your doctor right away if there is any bleeding from your vagina. Follow these instructions at home: Activity  Follow your doctor's instructions about how active you can be.  If needed, make plans for someone to help with your normal activities.  Do not exercise or do activities that take a lot of effort until your doctor says that this is safe.  Do not lift anything that is heavier than 10 lb (4.5 kg) until your doctor says that this is safe.  Do not have sex or orgasms until your doctor says that this is safe. Medicines  Take over-the-counter and prescription medicines only as told by your doctor.  Do not take aspirin. It can cause bleeding. General instructions  Watch your condition for any changes.  Write down: ? The number of pads you use each day. ? How often you change pads. ? How soaked your pads are.  Do not use tampons.  Do not douche.  If you pass any tissue from your vagina, save it to show to your doctor.  Keep all follow-up visits as told by your doctor. This is important. Contact a doctor if:  You have bleeding in the vagina at any time during pregnancy.  You have cramps.  You have a fever that does not get better with medicine. Get help right away if:  You have very bad cramps in your back or belly (abdomen).  You have contractions.  You have chills.  You pass large clots or a lot of tissue from your vagina.  Your bleeding gets worse.  You feel light-headed.  You feel weak.  You pass out (faint).  You are leaking fluid from your vagina.  You have a gush of fluid from your vagina. Summary  Sometimes vaginal bleeding during pregnancy is normal and is not a problem. Sometimes it may  be a sign of something serious.  Tell your doctor about any bleeding from your vagina right away.  Follow your doctor's instructions about how active you can be. You may need someone to help you with your normal activities. This information is not intended to replace advice given to you by your health care provider. Make sure you discuss any questions you have with your health care provider. Document Revised: 01/26/2019 Document Reviewed: 01/08/2017 Elsevier Patient Education  2020 [employer].   Constipation, Adult Constipation is when a person:  Poops (has a bowel movement) fewer times in a week than normal.  Has a hard time pooping.  Has poop that is dry, hard, or bigger than normal. Follow these instructions at home: Eating and drinking   Eat foods that have a lot of fiber, such as: ? Fresh fruits and vegetables. ? Whole grains. ? Beans.  Eat less of foods that are high in fat, low in fiber, or overly processed, such as: ? [place] fries. ? Hamburgers. ? Cookies. ? Candy. ? Soda.  Drink enough fluid to keep your pee (urine) clear or pale yellow. General instructions  Exercise regularly or as told by your doctor.  Go to the restroom when you feel like you need to poop. Do not hold it in.  Take over-the-counter and prescription medicines only as told by your doctor. These include any fiber supplements.  Do pelvic floor retraining exercises, such as: ? Doing deep breathing while relaxing your lower belly (abdomen). ? Relaxing your pelvic floor while pooping.  Watch your condition for any changes.  Keep all follow-up visits as told by your doctor. This is important. Contact a doctor if:  You have pain that gets worse.  You have a fever.  You have not pooped for 4 days.  You throw up (vomit).  You are not hungry.  You lose weight.  You are bleeding from the anus.  You have thin, pencil-like poop (stool). Get help right away if:  You have a fever,  and your symptoms suddenly get worse.  You leak poop or have blood in your poop.  Your belly feels hard or bigger than normal (is bloated).  You have very bad belly pain.  You feel dizzy or you faint. This information is not intended to replace advice given to you by your health care provider. Make sure you discuss any questions you have with your health care provider. Document Revised: 09/19/2017 Document Reviewed: 03/27/2016 Elsevier Patient Education  2020 [employer].

## 2020-07-21 NOTE — Telephone Encounter (Signed)
Per CRS and AMS pt adv to go to the ED.

## 2020-07-21 NOTE — MAU Provider Note (Signed)
History     CSN: [id number]  Arrival date and time: 07/21/20 5885   First Provider Initiated Contact with Patient 07/21/20 2009      Chief Complaint  Patient presents with  . Vaginal Bleeding   24 y.o. [id number] @17 .5 wks presenting with spotting. Reports seeing brown discharge in her underwear today, looks like old blood. Denies vaginal discharge prior. No concern for STDs. Also having cramping. Rates pain 8/10. Has not tried anything for it. Denies urinary sx. No recent sex. Reports constipation and straining to have BM. Her pregnancy has been uncomplicated.   OB History    Gravida  3   Para  2   Term  2   Preterm  0   AB  0   Living  2     SAB  0   TAB  0   Ectopic  0   Multiple  0   Live Births  2           Past Medical History:  Diagnosis Date  . Endometriosis   . Gastroenteritis 04/06/2016  . Migraine     Past Surgical History:  Procedure Laterality Date  . WISDOM TOOTH EXTRACTION      Family History  Problem Relation Age of Onset  . Endometriosis Maternal Grandmother   . Diabetes Maternal Grandmother   . Endometriosis Mother   . Bipolar disorder Father   . Asthma Brother   . Bipolar disorder Brother   . Asthma Son   . Seizures Son   . Breast cancer Maternal Great-grandmother     Social History   Tobacco Use  . Smoking status: Current Every Day Smoker    Packs/day: 0.25    Years: 9.00    Pack years: 2.25    Types: Cigarettes    Last attempt to quit: 04/16/2020    Years since quitting: 0.2  . Smokeless tobacco: Never Used  Vaping Use  . Vaping Use: Never used  Substance Use Topics  . Alcohol use: Not Currently  . Drug use: Not Currently    Types: Marijuana    Comment: Denies any Marijuana use since October 2020    Allergies:  Allergies  Allergen Reactions  . Other Other (See Comments)    oranges Uncoded Allergy. Allergen: Shellfish, Other Reaction: Other reaction    Medications Prior to Admission  Medication Sig  Dispense Refill Last Dose  . ondansetron (ZOFRAN) 4 MG tablet Take 1 tablet (4 mg total) by mouth every 8 (eight) hours as needed for nausea or vomiting. 20 tablet 2   . Prenatal Vit-Fe Fumarate-FA (MULTIVITAMIN-PRENATAL) 27-0.8 MG TABS tablet Take 1 tablet by mouth daily at 12 noon.       Review of Systems  Constitutional: Negative for chills and fever.  Gastrointestinal: Positive for constipation.  Genitourinary: Positive for vaginal bleeding. Negative for dysuria, frequency and urgency.   Physical Exam   Blood pressure 103/62, pulse (!) 105, temperature 98.9 F (37.2 C), resp. rate 18, height 4\' 11"  (1.499 m), weight 55.5 kg, last menstrual period 03/19/2020.  Physical Exam Vitals and nursing note reviewed. Exam conducted with a chaperone present.  Constitutional:      General: She is not in acute distress.    Appearance: Normal appearance.  HENT:     Head: Normocephalic and atraumatic.  Pulmonary:     Effort: Pulmonary effort is normal. No respiratory distress.  Abdominal:     Palpations: Abdomen is soft.     Tenderness: There is no  abdominal tenderness.  Genitourinary:    Comments: External: no lesions or erythema Vagina: rugated, pink, moist, scant yellow mucous discharge, no blood present Cervix closed/long  Musculoskeletal:        General: Normal range of motion.     Cervical back: Normal range of motion.  Skin:    General: Skin is warm and dry.  Neurological:     General: No focal deficit present.     Mental Status: She is alert and oriented to person, place, and time.  Psychiatric:        Mood and Affect: Mood normal.        Behavior: Behavior normal.   FHT 160  Results for orders placed or performed during the hospital encounter of 07/21/20 (from the past 24 hour(s))  Urinalysis, Routine w reflex microscopic Urine, Clean Catch     Status: Abnormal   Collection Time: 07/21/20  7:23 PM  Result Value Ref Range   Color, Urine STRAW (A) YELLOW   APPearance  CLEAR CLEAR   Specific Gravity, Urine 1.005 1.005 - 1.030   pH 7.0 5.0 - 8.0   Glucose, UA NEGATIVE NEGATIVE mg/dL   Hgb urine dipstick NEGATIVE NEGATIVE   Bilirubin Urine NEGATIVE NEGATIVE   Ketones, ur NEGATIVE NEGATIVE mg/dL   Protein, ur NEGATIVE NEGATIVE mg/dL   Nitrite NEGATIVE NEGATIVE   Leukocytes,Ua NEGATIVE NEGATIVE  Wet prep, genital     Status: Abnormal   Collection Time: 07/21/20  7:33 PM  Result Value Ref Range   Yeast Wet Prep HPF POC NONE SEEN NONE SEEN   Trich, Wet Prep NONE SEEN NONE SEEN   Clue Cells Wet Prep HPF POC NONE SEEN NONE SEEN   WBC, Wet Prep HPF POC MODERATE (A) NONE SEEN   Sperm NONE SEEN    MAU Course  Procedures  MDM Labs ordered and reviewed. No signs of infection or threatened SAB. Pt reassured. GC pending. Stable for discharge home.   Assessment and Plan   1. [redacted] weeks gestation of pregnancy   2. Supervision of low-risk pregnancy, first trimester   3. Spotting affecting pregnancy in second trimester   4. Blood type, Rh positive   5. Slow transit constipation    Discharge home Follow up at [facility] as scheduled SAB precautions  Allergies as of 07/21/2020      Reactions   Other Other (See Comments)   oranges Uncoded Allergy. Allergen: Shellfish, Other Reaction: Other reaction      Medication List    TAKE these medications   multivitamin-prenatal 27-0.8 MG Tabs tablet Take 1 tablet by mouth daily at 12 noon.   ondansetron 4 MG tablet Commonly known as: Zofran Take 1 tablet (4 mg total) by mouth every 8 (eight) hours as needed for nausea or vomiting.      Donette Larry, CNM 07/21/2020, 8:49 PM

## 2020-07-24 ENCOUNTER — Telehealth: Payer: Self-pay

## 2020-07-24 LAB — GC/CHLAMYDIA PROBE AMP (~~LOC~~) NOT AT ARMC
Chlamydia: NEGATIVE
Comment: NEGATIVE
Comment: NORMAL
Neisseria Gonorrhea: NEGATIVE

## 2020-07-24 NOTE — Telephone Encounter (Signed)
Pt called after hour triage line stating she was having contractions. Pt is 17 weeks.   Pt was seen in the ER as advised by nurse.

## 2020-08-02 ENCOUNTER — Encounter: Payer: Medicaid Other | Admitting: Obstetrics & Gynecology

## 2020-08-02 ENCOUNTER — Ambulatory Visit: Payer: Medicaid Other

## 2020-08-04 ENCOUNTER — Ambulatory Visit (INDEPENDENT_AMBULATORY_CARE_PROVIDER_SITE_OTHER): Payer: Medicaid Other | Admitting: Advanced Practice Midwife

## 2020-08-04 ENCOUNTER — Other Ambulatory Visit: Payer: Self-pay

## 2020-08-04 ENCOUNTER — Ambulatory Visit (INDEPENDENT_AMBULATORY_CARE_PROVIDER_SITE_OTHER): Payer: Medicaid Other

## 2020-08-04 ENCOUNTER — Encounter: Payer: Self-pay | Admitting: Advanced Practice Midwife

## 2020-08-04 VITALS — BP 100/60 | Wt 126.0 lb

## 2020-08-04 DIAGNOSIS — Z3A19 19 weeks gestation of pregnancy: Secondary | ICD-10-CM | POA: Diagnosis not present

## 2020-08-04 DIAGNOSIS — Z3689 Encounter for other specified antenatal screening: Secondary | ICD-10-CM | POA: Diagnosis not present

## 2020-08-04 DIAGNOSIS — Z3482 Encounter for supervision of other normal pregnancy, second trimester: Secondary | ICD-10-CM

## 2020-08-04 LAB — POCT URINALYSIS DIPSTICK OB
Glucose, UA: NEGATIVE
POC,PROTEIN,UA: NEGATIVE

## 2020-08-04 NOTE — Progress Notes (Signed)
  Routine Prenatal Care Visit  Subjective  Stefanie Sparks is a 24 y.o. G3P2002 at [redacted]w[redacted]d being seen today for ongoing prenatal care.  She is currently monitored for the following issues for this low-risk pregnancy and has Chronic pelvic pain in female; Migraine; Supervision of low-risk pregnancy, first trimester; and Tobacco use disorder on their problem list.  ----------------------------------------------------------------------------------- Patient reports mild round ligament pain.    . Vag. Bleeding: None.  Movement: Present. Leaking Fluid denies.  ----------------------------------------------------------------------------------- The following portions of the patient's history were reviewed and updated as appropriate: allergies, current medications, past family history, past medical history, past social history, past surgical history and problem list. Problem list updated.  Objective  Blood pressure 100/60, weight 126 lb (57.2 kg), last menstrual period 03/19/2020. Pregravid weight 116 lb (52.6 kg) Total Weight Gain 10 lb (4.536 kg) Urinalysis: Urine Protein    Urine Glucose    Fetal Status: Fetal Heart Rate (bpm): 140 Fundal Height: 20 cm Movement: Present  Presentation: Vertex   Anatomy scan: complete, normal, female, cephalic, placenta anterior  General:  Alert, oriented and cooperative. Patient is in no acute distress.  Skin: Skin is warm and dry. No rash noted.   Cardiovascular: Normal heart rate noted  Respiratory: Normal respiratory effort, no problems with respiration noted  Abdomen: Soft, gravid, appropriate for gestational age. Pain/Pressure: Absent     Pelvic:  Cervical exam deferred        Extremities: Normal range of motion.  Edema: None  Mental Status: Normal mood and affect. Normal behavior. Normal judgment and thought content.   Assessment   24 y.o. G3P2002 at [redacted]w[redacted]d by  12/24/2020, Date entered prior to episode creation presenting for routine prenatal visit  Plan    pregnancy 3 Problems (from 05/04/20 to present)    Problem Noted Resolved   Supervision of low-risk pregnancy, first trimester 05/07/2020 by [name], CNM No   Overview Addendum 06/30/2020  1:34 PM by Nadara Mustard, MD    Clinic [facility] Prenatal Labs  Dating LMP Blood type: O/Positive/-- (07/15 1430)   Genetic Screen NIPS:nml XX Antibody:Negative (07/15 1430)  Anatomic [place]  Rubella: 4.74 (07/15 1430) Varicella:Equivocal  GTT Third trimester:  RPR: Non Reactive (07/15 1430)   Rhogam n/a HBsAg: Negative (07/15 1430)   TDaP vaccine                  Flu Shot: HIV: Non Reactive (07/15 1430)   Baby Food                                GBS:   Contraception  RUE:AVWUJ pp  CBB  no   CS/VBAC n/a   Support Person           Previous Version       Preterm labor symptoms and general obstetric precautions including but not limited to vaginal bleeding, contractions, leaking of fluid and fetal movement were reviewed in detail with the patient.   Return in about 4 weeks (around 09/01/2020) for rob.  Tresea Mall, CNM 08/04/2020 9:08 AM

## 2020-09-01 ENCOUNTER — Ambulatory Visit (INDEPENDENT_AMBULATORY_CARE_PROVIDER_SITE_OTHER): Payer: Medicaid Other | Admitting: Obstetrics and Gynecology

## 2020-09-01 ENCOUNTER — Other Ambulatory Visit: Payer: Self-pay

## 2020-09-01 ENCOUNTER — Encounter: Payer: Self-pay | Admitting: Obstetrics and Gynecology

## 2020-09-01 VITALS — BP 118/74 | Wt 130.0 lb

## 2020-09-01 DIAGNOSIS — Z131 Encounter for screening for diabetes mellitus: Secondary | ICD-10-CM

## 2020-09-01 DIAGNOSIS — Z3A23 23 weeks gestation of pregnancy: Secondary | ICD-10-CM

## 2020-09-01 DIAGNOSIS — F17200 Nicotine dependence, unspecified, uncomplicated: Secondary | ICD-10-CM

## 2020-09-01 DIAGNOSIS — Z113 Encounter for screening for infections with a predominantly sexual mode of transmission: Secondary | ICD-10-CM

## 2020-09-01 DIAGNOSIS — Z3482 Encounter for supervision of other normal pregnancy, second trimester: Secondary | ICD-10-CM

## 2020-09-01 NOTE — Progress Notes (Signed)
  Routine Prenatal Care Visit  Subjective  Stefanie Sparks is a 24 y.o. G3P2002 at [redacted]w[redacted]d being seen today for ongoing prenatal care.  She is currently monitored for the following issues for this low-risk pregnancy and has Chronic pelvic pain in female; Migraine; Supervision of other normal pregnancy, antepartum; and Tobacco use disorder on their problem list.  ----------------------------------------------------------------------------------- Patient reports no complaints.    . Vag. Bleeding: None.  Movement: Present. Leaking Fluid denies.  ----------------------------------------------------------------------------------- The following portions of the patient's history were reviewed and updated as appropriate: allergies, current medications, past family history, past medical history, past social history, past surgical history and problem list. Problem list updated.  Objective  Blood pressure 118/74, weight 130 lb (59 kg), last menstrual period 03/19/2020. Pregravid weight 116 lb (52.6 kg) Total Weight Gain 14 lb (6.35 kg) Urinalysis: Urine Protein    Urine Glucose    Fetal Status: Fetal Heart Rate (bpm): 135 Fundal Height: 24 cm Movement: Present     General:  Alert, oriented and cooperative. Patient is in no acute distress.  Skin: Skin is warm and dry. No rash noted.   Cardiovascular: Normal heart rate noted  Respiratory: Normal respiratory effort, no problems with respiration noted  Abdomen: Soft, gravid, appropriate for gestational age. Pain/Pressure: Absent     Pelvic:  Cervical exam deferred        Extremities: Normal range of motion.  Edema: None  Mental Status: Normal mood and affect. Normal behavior. Normal judgment and thought content.   Assessment   24 y.o. G3P2002 at [redacted]w[redacted]d by  12/24/2020, Date entered prior to episode creation presenting for routine prenatal visit  Plan   pregnancy 3 Problems (from 05/04/20 to present)    Problem Noted Resolved   Supervision of other  normal pregnancy, antepartum 05/07/2020 by [name], CNM No   Overview Addendum 09/01/2020  9:18 AM by [name], MD    [facility]inic [facility] Prenatal Labs  Dating LMP Blood type: O/Positive/-- (07/15 1430)   Genetic Screen NIPS:nml XX Antibody:Negative (07/15 1430)  Anatomic [place] complete Rubella: 4.74 (07/15 1430) Varicella:Equivocal  GTT Third trimester:  RPR: Non Reactive (07/15 1430)   Rhogam n/a HBsAg: Negative (07/15 1430)   TDaP vaccine                  Flu Shot: HIV: Non Reactive (07/15 1430)   Baby Food                                GBS:   Contraception  Pap: 08/25/2019, NILM at [facility]  CBB  no   CS/VBAC n/a   Support Person         Previous Version        Preterm labor symptoms and general obstetric precautions including but not limited to vaginal bleeding, contractions, leaking of fluid and fetal movement were reviewed in detail with the patient. Please refer to After Visit Summary for other counseling recommendations.   28 wk labs next visit  Return in about 4 weeks (around 09/29/2020) for 28 week labs with routine prenatal.   Thomasene Mohair, MD, [facility], [facility] 09/01/2020 9:18 AM

## 2020-09-04 ENCOUNTER — Telehealth: Payer: Self-pay

## 2020-09-04 NOTE — Telephone Encounter (Signed)
Spoke w/patient. She reports this started at 4am yesterday. She has been to the bathroom for diarrhea about every two hours. Afraid to eat anything really. She took zofran that stopped the vomiting. Advised on BRAT diet and need to report to ED if unable to keep anything (food/fluids) down/in for a full 24 hours. She will try BRAT diet and monitor. If continues or gets worse, will report to ED>L&D for evaluation/monitoring for possible need of fluids.

## 2020-09-04 NOTE — Telephone Encounter (Signed)
Patient reports she woke up yesterday morning w/severe diarrhea, headache and vomiting. She took tyelenol and immodium. Nothing has stopped it. She woke up worse this morning. Causing bad contractions. [phone]

## 2020-09-08 ENCOUNTER — Ambulatory Visit (INDEPENDENT_AMBULATORY_CARE_PROVIDER_SITE_OTHER): Payer: Medicaid Other | Admitting: Obstetrics

## 2020-09-08 ENCOUNTER — Other Ambulatory Visit: Payer: Self-pay

## 2020-09-08 ENCOUNTER — Other Ambulatory Visit (HOSPITAL_COMMUNITY)
Admission: RE | Admit: 2020-09-08 | Discharge: 2020-09-08 | Disposition: A | Discharge status: Home / Self Care | Payer: Medicaid Other | Source: Ambulatory Visit | Attending: Obstetrics | Admitting: Obstetrics

## 2020-09-08 VITALS — BP 90/60 | Wt 130.0 lb

## 2020-09-08 DIAGNOSIS — O26892 Other specified pregnancy related conditions, second trimester: Secondary | ICD-10-CM

## 2020-09-08 DIAGNOSIS — Z3A24 24 weeks gestation of pregnancy: Secondary | ICD-10-CM

## 2020-09-08 DIAGNOSIS — N898 Other specified noninflammatory disorders of vagina: Secondary | ICD-10-CM | POA: Insufficient documentation

## 2020-09-08 DIAGNOSIS — Z3482 Encounter for supervision of other normal pregnancy, second trimester: Secondary | ICD-10-CM | POA: Diagnosis present

## 2020-09-08 LAB — POCT URINALYSIS DIPSTICK OB
Glucose, UA: NEGATIVE
POC,PROTEIN,UA: NEGATIVE

## 2020-09-08 NOTE — Progress Notes (Signed)
C/o leaking fluid - clear and watery; GFM.rj

## 2020-09-08 NOTE — Progress Notes (Signed)
  Routine Prenatal Care Visit  Subjective  Stefanie Sparks is a 24 y.o. G3P2002 at [redacted]w[redacted]d being seen today for ongoing prenatal care.  She is currently monitored for the following issues for this low-risk pregnancy and has Chronic pelvic pain in female; Migraine; Supervision of other normal pregnancy, antepartum; and Tobacco use disorder on their problem list. She is worked I for c/o LOF ----------------------------------------------------------------------------------- Patient reports a vaginal discharge for last two days. Wants to be sure she is not ruptured..    .  .   Pincus Large Fluid admits to.  ----------------------------------------------------------------------------------- The following portions of the patient's history were reviewed and updated as appropriate: allergies, current medications, past family history, past medical history, past social history, past surgical history and problem list. Problem list updated.  Objective  Blood pressure 90/60, weight 130 lb (59 kg), last menstrual period 03/19/2020. Pregravid weight 116 lb (52.6 kg) Total Weight Gain 14 lb (6.35 kg) Urinalysis: Urine Protein Negative  Urine Glucose Negative  Fetal Status:           General:  Alert, oriented and cooperative. Patient is in no acute distress.  Skin: Skin is warm and dry. No rash noted.   Cardiovascular: Normal heart rate noted  Respiratory: Normal respiratory effort, no problems with respiration noted  Abdomen: Soft, gravid, appropriate for gestational age.       Pelvic:  Cervical exam deferred        Extremities: Normal range of motion.     Mental Status: Normal mood and affect. Normal behavior. Normal judgment and thought content.   Assessment   24 y.o. G3P2002 at [redacted]w[redacted]d by  12/24/2020, Date entered prior to episode creation presenting for work-in prenatal visit  Plan   pregnancy 3 Problems (from 05/04/20 to present)    Problem Noted Resolved   Supervision of other normal pregnancy,  antepartum 05/07/2020 by [name], CNM No   Overview Addendum 09/01/2020  9:18 AM by [name], MD    [facility]inic [facility] Prenatal Labs  Dating LMP Blood type: O/Positive/-- (07/15 1430)   Genetic Screen NIPS:nml XX Antibody:Negative (07/15 1430)  Anatomic [place] complete Rubella: 4.74 (07/15 1430) Varicella:Equivocal  GTT Third trimester:  RPR: Non Reactive (07/15 1430)   Rhogam n/a HBsAg: Negative (07/15 1430)   TDaP vaccine                  Flu Shot: HIV: Non Reactive (07/15 1430)   Baby Food                                GBS:   Contraception  Pap: 08/25/2019, NILM at [facility]  CBB  no   CS/VBAC n/a   Support Person           Previous Version       Preterm labor symptoms and general obstetric precautions including but not limited to vaginal bleeding, contractions, leaking of fluid and fetal movement were reviewed in detail with the patient. Please refer to After Visit Summary for other counseling recommendations.  Aptima swab sent to evalaute for yeast or BV or trich. We will contact her with the results.  No follow-ups on file.  [name], CNM  09/08/2020 4:58 PM

## 2020-09-12 LAB — CERVICOVAGINAL ANCILLARY ONLY
Bacterial Vaginitis (gardnerella): NEGATIVE
Candida Glabrata: NEGATIVE
Candida Vaginitis: POSITIVE — AB
Chlamydia: NEGATIVE
Comment: NEGATIVE
Comment: NEGATIVE
Comment: NEGATIVE
Comment: NEGATIVE
Comment: NEGATIVE
Comment: NORMAL
Neisseria Gonorrhea: NEGATIVE
Trichomonas: NEGATIVE

## 2020-09-15 ENCOUNTER — Encounter: Payer: Self-pay | Admitting: Obstetrics

## 2020-09-15 ENCOUNTER — Other Ambulatory Visit: Payer: Self-pay | Admitting: Obstetrics

## 2020-09-15 DIAGNOSIS — B3731 Acute candidiasis of vulva and vagina: Secondary | ICD-10-CM

## 2020-09-15 DIAGNOSIS — B373 Candidiasis of vulva and vagina: Secondary | ICD-10-CM

## 2020-09-15 DIAGNOSIS — N898 Other specified noninflammatory disorders of vagina: Secondary | ICD-10-CM

## 2020-09-15 MED ORDER — TERCONAZOLE 0.4 % VA CREA: 1.0000 | TOPICAL_CREAM | Freq: Every day | VAGINAL | 0 refills | Status: DC

## 2020-09-29 ENCOUNTER — Other Ambulatory Visit: Payer: Self-pay

## 2020-09-29 ENCOUNTER — Ambulatory Visit (INDEPENDENT_AMBULATORY_CARE_PROVIDER_SITE_OTHER): Payer: Medicaid Other | Admitting: Obstetrics

## 2020-09-29 ENCOUNTER — Other Ambulatory Visit: Payer: Medicaid Other

## 2020-09-29 VITALS — BP 100/68 | Wt 139.4 lb

## 2020-09-29 DIAGNOSIS — Z131 Encounter for screening for diabetes mellitus: Secondary | ICD-10-CM

## 2020-09-29 DIAGNOSIS — Z3480 Encounter for supervision of other normal pregnancy, unspecified trimester: Secondary | ICD-10-CM

## 2020-09-29 DIAGNOSIS — Z3A27 27 weeks gestation of pregnancy: Secondary | ICD-10-CM

## 2020-09-29 DIAGNOSIS — Z3482 Encounter for supervision of other normal pregnancy, second trimester: Secondary | ICD-10-CM

## 2020-09-29 DIAGNOSIS — Z113 Encounter for screening for infections with a predominantly sexual mode of transmission: Secondary | ICD-10-CM

## 2020-09-29 NOTE — Progress Notes (Signed)
  Routine Prenatal Care Visit  Subjective  Stefanie Sparks is a 24 y.o. G3P2002 at [redacted]w[redacted]d being seen today for ongoing prenatal care.  She is currently monitored for the following issues for this low-risk pregnancy and has Chronic pelvic pain in female; Migraine; Supervision of other normal pregnancy, antepartum; and Tobacco use disorder on their problem list.  ----------------------------------------------------------------------------------- Patient reports no complaints.  She does later mention that she has lots of reflux and heartburn.  . Vag. Bleeding: None.  Movement: Present. Leaking Fluid denies.  ----------------------------------------------------------------------------------- The following portions of the patient's history were reviewed and updated as appropriate: allergies, current medications, past family history, past medical history, past social history, past surgical history and problem list. Problem list updated.  Objective  Blood pressure 100/68, weight 139 lb 6.4 oz (63.2 kg), last menstrual period 03/19/2020. Pregravid weight 116 lb (52.6 kg) Total Weight Gain 23 lb 6.4 oz (10.6 kg) Urinalysis: Urine Protein    Urine Glucose    Fetal Status:     Movement: Present     General:  Alert, oriented and cooperative. Patient is in no acute distress.  Skin: Skin is warm and dry. No rash noted.   Cardiovascular: Normal heart rate noted  Respiratory: Normal respiratory effort, no problems with respiration noted  Abdomen: Soft, gravid, appropriate for gestational age. Pain/Pressure: Absent     Pelvic:  Cervical exam deferred        Extremities: Normal range of motion.     Mental Status: Normal mood and affect. Normal behavior. Normal judgment and thought content.   Assessment   24 y.o. G3P2002 at [redacted]w[redacted]d by  12/24/2020, Date entered prior to episode creation presenting for routine prenatal visit  Plan   pregnancy 3 Problems (from 05/04/20 to present)    Problem Noted Resolved    Supervision of other normal pregnancy, antepartum 05/07/2020 by [name], CNM No   Overview Addendum 09/01/2020  9:18 AM by [name], MD    [facility]inic [facility] Prenatal Labs  Dating LMP Blood type: O/Positive/-- (07/15 1430)   Genetic Screen NIPS:nml XX Antibody:Negative (07/15 1430)  Anatomic [place] complete Rubella: 4.74 (07/15 1430) Varicella:Equivocal  GTT Third trimester:  RPR: Non Reactive (07/15 1430)   Rhogam n/a HBsAg: Negative (07/15 1430)   TDaP vaccine                  Flu Shot: HIV: Non Reactive (07/15 1430)   Baby Food                                GBS:   Contraception  Pap: 08/25/2019, NILM at [facility]  CBB  no   CS/VBAC n/a   Support Person           Previous Version       Preterm labor symptoms and general obstetric precautions including but not limited to vaginal bleeding, contractions, leaking of fluid and fetal movement were reviewed in detail with the patient. Please refer to After Visit Summary for other counseling recommendations.  Having her 1hr GTT today. Advised her to start on a dilay Zantac for reflux Return in about 2 weeks (around 10/13/2020) for return OB.  [name], CNM  09/29/2020 9:57 AM

## 2020-09-30 LAB — 28 WEEK RH+PANEL
Basophils Absolute: 0 10*3/uL (ref 0.0–0.2)
Basos: 0 %
EOS (ABSOLUTE): 0.1 10*3/uL (ref 0.0–0.4)
Eos: 1 %
Gestational Diabetes Screen: 95 mg/dL (ref 65–139)
HIV Screen 4th Generation wRfx: NONREACTIVE
Hematocrit: 29.4 % — ABNORMAL LOW (ref 34.0–46.6)
Hemoglobin: 10.1 g/dL — ABNORMAL LOW (ref 11.1–15.9)
Immature Grans (Abs): 0.2 10*3/uL — ABNORMAL HIGH (ref 0.0–0.1)
Immature Granulocytes: 2 %
Lymphocytes Absolute: 1.8 10*3/uL (ref 0.7–3.1)
Lymphs: 16 %
MCH: 30.1 pg (ref 26.6–33.0)
MCHC: 34.4 g/dL (ref 31.5–35.7)
MCV: 88 fL (ref 79–97)
Monocytes Absolute: 1 10*3/uL — ABNORMAL HIGH (ref 0.1–0.9)
Monocytes: 9 %
Neutrophils Absolute: 8.1 10*3/uL — ABNORMAL HIGH (ref 1.4–7.0)
Neutrophils: 72 %
Platelets: 239 10*3/uL (ref 150–450)
RBC: 3.36 x10E6/uL — ABNORMAL LOW (ref 3.77–5.28)
RDW: 12.2 % (ref 11.7–15.4)
RPR Ser Ql: NONREACTIVE
WBC: 11.3 10*3/uL — ABNORMAL HIGH (ref 3.4–10.8)

## 2020-09-30 LAB — HEPATITIS C ANTIBODY: Hep C Virus Ab: <0.1 s/co ratio (ref 0.0–0.9)

## 2020-10-16 ENCOUNTER — Encounter: Payer: Self-pay | Admitting: Obstetrics and Gynecology

## 2020-10-16 ENCOUNTER — Other Ambulatory Visit: Payer: Self-pay

## 2020-10-16 ENCOUNTER — Ambulatory Visit (INDEPENDENT_AMBULATORY_CARE_PROVIDER_SITE_OTHER): Payer: Medicaid Other | Admitting: Obstetrics and Gynecology

## 2020-10-16 VITALS — BP 100/62 | Wt 142.0 lb

## 2020-10-16 DIAGNOSIS — Z7185 Encounter for immunization safety counseling: Secondary | ICD-10-CM

## 2020-10-16 DIAGNOSIS — Z3483 Encounter for supervision of other normal pregnancy, third trimester: Secondary | ICD-10-CM

## 2020-10-16 DIAGNOSIS — Z3A30 30 weeks gestation of pregnancy: Secondary | ICD-10-CM

## 2020-10-16 LAB — POCT URINALYSIS DIPSTICK OB
Glucose, UA: NEGATIVE
POC,PROTEIN,UA: NEGATIVE

## 2020-10-16 NOTE — Progress Notes (Signed)
    Routine Prenatal Care Visit  Subjective  Stefanie Sparks is a 25 y.o. G3P2002 at [redacted]w[redacted]d being seen today for ongoing prenatal care.  She is currently monitored for the following issues for this low-risk pregnancy and has Chronic pelvic pain in female; Migraine; Supervision of other normal pregnancy, antepartum; and Tobacco use disorder on their problem list.  ----------------------------------------------------------------------------------- Patient reports increased pelvic pressure. Irregular, occasional contractions.   Contractions: Irregular. Vag. Bleeding: None.  Movement: Present. Denies leaking of fluid.  ----------------------------------------------------------------------------------- The following portions of the patient's history were reviewed and updated as appropriate: allergies, current medications, past family history, past medical history, past social history, past surgical history and problem list. Problem list updated.   Objective  Blood pressure 100/62, weight 142 lb (64.4 kg), last menstrual period 03/19/2020. Pregravid weight 116 lb (52.6 kg) Total Weight Gain 26 lb (11.8 kg) Urinalysis:      Fetal Status: Fetal Heart Rate (bpm): 140 Fundal Height: 29 cm Movement: Present     General:  Alert, oriented and cooperative. Patient is in no acute distress.  Skin: Skin is warm and dry. No rash noted.   Cardiovascular: Normal heart rate noted  Respiratory: Normal respiratory effort, no problems with respiration noted  Abdomen: Soft, gravid, appropriate for gestational age. Pain/Pressure: Absent     Pelvic:  Cervical exam deferred        Extremities: Normal range of motion.  Edema: None  ental Status: Normal mood and affect. Normal behavior. Normal judgment and thought content.     Assessment   24 y.o. G3P2002 at [redacted]w[redacted]d by  12/24/2020, Date entered prior to episode creation presenting for routine prenatal visit  Plan   pregnancy 3 Problems (from 05/04/20 to present)     Problem Noted Resolved   Supervision of other normal pregnancy, antepartum 05/07/2020 by [name], CNM No   Overview Addendum 10/16/2020  9:11 AM by [name], CNM    Clinic [facility] Prenatal Labs  Dating LMP Blood type: O/Positive/-- (07/15 1430)   Genetic Screen NIPS:nml XX Antibody:Negative (07/15 1430)  Anatomic [place] complete Rubella: 4.74 (07/15 1430) Varicella:Equivocal  GTT Third trimester: 95 RPR: Non Reactive (07/15 1430)   Rhogam n/a HBsAg: Negative (07/15 1430)   TDaP vaccine                  Flu Shot: HIV: Non Reactive (07/15 1430)   Baby Food                                GBS:   Contraception  Pap: 08/25/2019, NILM at [facility]  CBB  no   CS/VBAC n/a   Support Person           Previous Version      -Discussed Tdap vaccine, patient desires administration at next visit -Reviewed abdominal support/maternity belt for pelvic pressure  Preterm labor precautions including but not limited to vaginal bleeding, contractions, leaking of fluid and fetal movement were reviewed in detail with the patient.    Return in about 2 weeks (around 10/30/2020) for ROB.  [name], CNM, MSN [facility], [facility] 10/16/2020, 9:12 AM

## 2020-10-19 ENCOUNTER — Other Ambulatory Visit: Payer: Self-pay

## 2020-10-19 ENCOUNTER — Encounter: Payer: Self-pay | Admitting: Obstetrics and Gynecology

## 2020-10-19 ENCOUNTER — Observation Stay
Admission: EM | Admit: 2020-10-19 | Discharge: 2020-10-19 | Disposition: A | Discharge status: Home / Self Care | Payer: Medicaid Other | Attending: Obstetrics and Gynecology | Admitting: Obstetrics and Gynecology

## 2020-10-19 ENCOUNTER — Telehealth: Payer: Self-pay

## 2020-10-19 DIAGNOSIS — Z3490 Encounter for supervision of normal pregnancy, unspecified, unspecified trimester: Secondary | ICD-10-CM

## 2020-10-19 DIAGNOSIS — R102 Pelvic and perineal pain: Secondary | ICD-10-CM | POA: Diagnosis not present

## 2020-10-19 DIAGNOSIS — O99333 Smoking (tobacco) complicating pregnancy, third trimester: Secondary | ICD-10-CM | POA: Insufficient documentation

## 2020-10-19 DIAGNOSIS — F17210 Nicotine dependence, cigarettes, uncomplicated: Secondary | ICD-10-CM | POA: Insufficient documentation

## 2020-10-19 DIAGNOSIS — Z3A30 30 weeks gestation of pregnancy: Secondary | ICD-10-CM

## 2020-10-19 DIAGNOSIS — O26893 Other specified pregnancy related conditions, third trimester: Principal | ICD-10-CM | POA: Diagnosis present

## 2020-10-19 DIAGNOSIS — Z3480 Encounter for supervision of other normal pregnancy, unspecified trimester: Secondary | ICD-10-CM

## 2020-10-19 LAB — URINALYSIS, ROUTINE W REFLEX MICROSCOPIC
Bilirubin Urine: NEGATIVE
Glucose, UA: NEGATIVE mg/dL
Hgb urine dipstick: NEGATIVE
Ketones, ur: NEGATIVE mg/dL
Nitrite: NEGATIVE
Protein, ur: NEGATIVE mg/dL
Specific Gravity, Urine: 1.012 (ref 1.005–1.030)
pH: 7 (ref 5.0–8.0)

## 2020-10-19 LAB — WET PREP, GENITAL
Clue Cells Wet Prep HPF POC: NONE SEEN
Sperm: NONE SEEN
Trich, Wet Prep: NONE SEEN
WBC, Wet Prep HPF POC: NONE SEEN
Yeast Wet Prep HPF POC: NONE SEEN

## 2020-10-19 NOTE — Discharge Summary (Signed)
See final progress note. 

## 2020-10-19 NOTE — Final Progress Note (Addendum)
Final Progress Note  Patient ID: Stefanie Sparks MRN: [id number] DOB/AGE: 26-31-1997 26 y.o.  Admit date: 10/19/2020 Admitting provider: [name], CNM, MSN Discharge date: 10/19/2020   Admission Diagnoses: Pelvic pain affecting pregnancy in third trimester  Discharge Diagnoses:  Active Problems:   Pregnancy   Pelvic pain affecting pregnancy in third trimester, antepartum   History of Present Illness: The patient is a 24 y.o. female G3P2002 at [redacted]w[redacted]d who presents for "pelvic pressure and contractions." Patient has had pelvic pressure since office visit on 10/16/20. Patient states that the pressure intensified last night and throughout the day today. Patient states the pressure and pain can reach an 8/10 pain rating when associated with "cramping." Patient states the cramping/contraction sensation occurs about every five mins. Patient states the pelvic pressure is interfering with her sleep. Patient has tried a maternity belt and hot bath with little to no relief. Patient denies further S&S at this time including frequent urination, burning with urination, vaginal odor or discharge.   Past Medical History:  Diagnosis Date  . Endometriosis   . Gastroenteritis 04/06/2016  . Migraine     Past Surgical History:  Procedure Laterality Date  . WISDOM TOOTH EXTRACTION      No current facility-administered medications on file prior to encounter.   Current Outpatient Medications on File Prior to Encounter  Medication Sig Dispense Refill  . Prenatal Vit-Fe Fumarate-FA (MULTIVITAMIN-PRENATAL) 27-0.8 MG TABS tablet Take 1 tablet by mouth daily at 12 noon.    . ondansetron (ZOFRAN) 4 MG tablet Take 1 tablet (4 mg total) by mouth every 8 (eight) hours as needed for nausea or vomiting. (Patient not taking: Reported on 09/29/2020) 20 tablet 2  . terconazole (TERAZOL 7) 0.4 % vaginal cream Place 1 applicator vaginally at bedtime. (Patient not taking: Reported on 09/29/2020) 45 g 0     Allergies  Allergen Reactions  . Other Other (See Comments)    oranges Uncoded Allergy. Allergen: Shellfish, Other Reaction: Other reaction    Social History   Socioeconomic History  . Marital status: Significant Other    Spouse name: Demitri  . Number of children: 2  . Years of education: Not on file  . Highest education level: Not on file  Occupational History  . Not on file  Tobacco Use  . Smoking status: Current Every Day Smoker    Packs/day: 0.25    Years: 9.00    Pack years: 2.25    Types: Cigarettes    Last attempt to quit: 04/16/2020    Years since quitting: 0.5  . Smokeless tobacco: Never Used  Vaping Use  . Vaping Use: Never used  Substance and Sexual Activity  . Alcohol use: Not Currently  . Drug use: Not Currently    Types: Marijuana    Comment: Denies any Marijuana use since October 2020  . Sexual activity: Yes    Partners: Male    Birth control/protection: None  Other Topics Concern  . Not on file  Social History Narrative  . Not on file   Social Determinants of Health   Financial Resource Strain: Not on file  Food Insecurity: Not on file  Transportation Needs: Not on file  Physical Activity: Not on file  Stress: Not on file  Social Connections: Not on file  Intimate Partner Violence: Not At Risk  . Fear of Current or Ex-Partner: No  . Emotionally Abused: No  . Physically Abused: No  . Sexually Abused: No    Family History  Problem  Relation Age of Onset  . Endometriosis Maternal Grandmother   . Diabetes Maternal Grandmother   . Endometriosis Mother   . Bipolar disorder Father   . Asthma Brother   . Bipolar disorder Brother   . Asthma Son   . Seizures Son   . Breast cancer Maternal Great-grandmother      Review of Systems  Constitutional: Negative for fever and malaise/fatigue.  HENT: Negative.   Eyes: Negative.   Respiratory: Negative.   Cardiovascular: Negative.   Gastrointestinal: Positive for abdominal pain. Negative for  nausea.  Genitourinary: Negative for dysuria, flank pain, frequency and urgency.       Lower pelvic pressure  Musculoskeletal: Negative.   Skin: Negative.   Neurological: Negative.   Endo/Heme/Allergies: Negative.   Psychiatric/Behavioral: Negative.      Physical Exam: BP 115/68 (BP Location: Right Arm)   Pulse (!) 109   Temp 98.5 F (36.9 C) (Oral)   Resp 16   Ht 4\' 11"  (1.499 m)   Wt 64.4 kg   LMP 03/19/2020 (Exact Date)   BMI 28.68 kg/m   Physical Exam Constitutional:      Appearance: Normal appearance.  Genitourinary:     Genitourinary Comments: External: Normal appearing vulva. No lesions noted.  Speculum examination: Normal appearing cervix. No blood in the vaginal vault. Small amount of white mucoid discharge.    HENT:     Head: Normocephalic.  Eyes:     Pupils: Pupils are equal, round, and reactive to light.  Cardiovascular:     Rate and Rhythm: Regular rhythm. Tachycardia present.     Pulses: Normal pulses.  Pulmonary:     Effort: Pulmonary effort is normal.  Abdominal:     Palpations: Abdomen is soft.     Tenderness: There is no abdominal tenderness. There is no right CVA tenderness, left CVA tenderness or guarding.     Comments: gravid  Musculoskeletal:        General: Normal range of motion.     Cervical back: Normal range of motion.  Neurological:     Mental Status: She is alert and oriented to person, place, and time.  Skin:    General: Skin is warm and dry.  Psychiatric:        Mood and Affect: Mood normal.        Behavior: Behavior normal.  Vitals reviewed.     Consults: None  Significant Findings/ Diagnostic Studies: No significant findings  Procedures: NST NONSTRESS TEST INTERPRETATION  INDICATIONS: rule out uterine contractions FHR baseline: 125 RESULTS:  A NST procedure was performed with FHR monitoring and a normal baseline established, appropriate time of 20-40 minutes of evaluation, and accels >2 seen w 15x15 characteristics.   Results show a REACTIVE NST.    Hospital Course: The patient was admitted to Labor and Delivery Triage for observation. Physical exam - SVE: closed/thick/-2. Reviewed finding of fetal station with patient. UA and wet prep neg. Uterine irritability on toco. Encouraged oral hydration with the patient. Reactive NST, fetal well-being reassuring. Reviewed findings with patient. Patient comfortable with discharge home at this time. Reviewed S&S of preterm labor including frequent, strong contractions, LOF, +VB. Patient states understanding.   Discharge Condition: good  Disposition: Discharge disposition: 01-Home or Self Care       Diet: Regular diet  Discharge Activity: Activity as tolerated   Allergies as of 10/19/2020      Reactions   Other Other (See Comments)   oranges Uncoded Allergy. Allergen: Shellfish, Other Reaction: Other  reaction      Medication List    STOP taking these medications   ondansetron 4 MG tablet Commonly known as: Zofran   terconazole 0.4 % vaginal cream Commonly known as: Terazol 7     TAKE these medications   multivitamin-prenatal 27-0.8 MG Tabs tablet Take 1 tablet by mouth daily at 12 noon.      Keep previously scheduled f/u at [facility].  Total time spent taking care of this patient: 20 minutes  Signed:  [name], CNM 10/19/2020, 11:33 PM

## 2020-10-19 NOTE — Discharge Instructions (Signed)
LABOR: When contractions begin, you should start to time them from the beginning of one contraction to the beginning of the next.  When contractions are 5-10 minutes apart or less and have been regular for at least an hour, you should call your health care provider.  Notify your doctor if any of the following occur: 1. Bleeding from the vagina 7. Sudden, constant, or occasional abdominal pain  2. Pain or burning when urinating 8. Sudden gushing of fluid from the vagina (with or without continued leaking)  3. Chills or fever 9. Fainting spells, "black outs" or loss of consciousness  4. Increase in vaginal discharge 10. Severe or continued nausea or vomiting  5. Pelvic pressure (sudden increase) 11. Blurring of vision or spots before the eyes  6. Baby moving less than usual 12. Leaking of fluid     

## 2020-10-19 NOTE — OB Triage Note (Signed)
Pt discharged home in stable condition. RN provided discharge instructions to pt, including when to come back. Pt verbalized understanding and all questions answered at this time.  

## 2020-10-19 NOTE — Telephone Encounter (Signed)
Pt calling; is hurting really bad in her lower area since last night; not letting up.  [phone]  Pt states whole abd is getting hard and lets up and then gets hard again; happens about 10 times in an hour; not leaking fluid; baby seems to be moving well.  Adv to go to L&D for eval; Lori notified.

## 2020-10-19 NOTE — OB Triage Note (Signed)
Pt arrived to [facility] with complaints of contractions and vaginal pressure. Pt states pressure is constant and contractions feel about every 4 mins a part. Pt rates pain of pressure 8/10. Pt states positive FM. Pt denies vaginal bleeding, LOF, and N/V. Monitors applied and assessing. Initial FHT 135.

## 2020-10-21 NOTE — L&D Delivery Note (Signed)
Delivery Note At 10:50 AM a viable female was delivered via Vaginal, Spontaneous (Presentation:      ROA).  APGAR: 8. 9; weight pending.   Placenta status: Spontaneous, Intact.  Cord: 3 vessels with the following complications: None.  Cord pH: N/A  Anesthesia: Epidural Episiotomy:  none Lacerations:  none Suture Repair: none Est. Blood Loss (mL):   Mom to postpartum.  Baby to Couplet care / Skin to Skin.  Vena Austria 12/16/2020, 11:04 AM

## 2020-10-23 ENCOUNTER — Other Ambulatory Visit: Payer: Self-pay

## 2020-10-23 ENCOUNTER — Encounter: Payer: Self-pay | Admitting: Obstetrics & Gynecology

## 2020-10-23 ENCOUNTER — Observation Stay
Admission: EM | Admit: 2020-10-23 | Discharge: 2020-10-24 | Disposition: A | Discharge status: Home / Self Care | Payer: Medicaid Other | Attending: Obstetrics & Gynecology | Admitting: Obstetrics & Gynecology

## 2020-10-23 ENCOUNTER — Telehealth: Payer: Self-pay

## 2020-10-23 DIAGNOSIS — R109 Unspecified abdominal pain: Secondary | ICD-10-CM | POA: Diagnosis not present

## 2020-10-23 DIAGNOSIS — F17210 Nicotine dependence, cigarettes, uncomplicated: Secondary | ICD-10-CM | POA: Diagnosis not present

## 2020-10-23 DIAGNOSIS — U071 COVID-19: Secondary | ICD-10-CM | POA: Diagnosis present

## 2020-10-23 DIAGNOSIS — O98513 Other viral diseases complicating pregnancy, third trimester: Principal | ICD-10-CM

## 2020-10-23 DIAGNOSIS — Z3480 Encounter for supervision of other normal pregnancy, unspecified trimester: Secondary | ICD-10-CM

## 2020-10-23 DIAGNOSIS — O99333 Smoking (tobacco) complicating pregnancy, third trimester: Secondary | ICD-10-CM | POA: Insufficient documentation

## 2020-10-23 DIAGNOSIS — Z3A31 31 weeks gestation of pregnancy: Secondary | ICD-10-CM

## 2020-10-23 DIAGNOSIS — R102 Pelvic and perineal pain: Secondary | ICD-10-CM

## 2020-10-23 DIAGNOSIS — O26893 Other specified pregnancy related conditions, third trimester: Secondary | ICD-10-CM | POA: Diagnosis not present

## 2020-10-23 MED ORDER — CALCIUM CARBONATE ANTACID 500 MG PO CHEW
200.0000 mg | CHEWABLE_TABLET | Freq: Once | ORAL | Status: AC
  Administered 2020-10-23: 200 mg via ORAL

## 2020-10-23 MED ORDER — ACETAMINOPHEN 325 MG PO TABS
650.0000 mg | ORAL_TABLET | Freq: Four times a day (QID) | ORAL | Status: DC | PRN
  Administered 2020-10-23: 650 mg via ORAL

## 2020-10-23 NOTE — Telephone Encounter (Signed)
Pt calling c/o fever of 101. Was seen at hospital last Thursday. Tried to call pt to see if she has taken tylenol. Please let me know when she calls back

## 2020-10-23 NOTE — OB Triage Note (Addendum)
Pt is a G3P2, started having contraction off and on since 12/30. Contraction have been 10 mins apart today since around 7a. Pt reports no bleeding or LOF. Pt stated that she has been vomiting and does have diarrhea that started around 10a. Pt stated that she took a home covid test at 6:10p and the test results were positive. Pt does have a dry sometimes productive cough, headache, seeing spots (left), temp 99.2. Pt reports cramping in her lower abdomen, described as a mild "period cramp".

## 2020-10-24 DIAGNOSIS — O98513 Other viral diseases complicating pregnancy, third trimester: Secondary | ICD-10-CM | POA: Diagnosis not present

## 2020-10-24 LAB — CBC WITH DIFFERENTIAL/PLATELET
Abs Immature Granulocytes: 0.14 10*3/uL — ABNORMAL HIGH (ref 0.00–0.07)
Basophils Absolute: 0 10*3/uL (ref 0.0–0.1)
Basophils Relative: 0 %
Eosinophils Absolute: 0 10*3/uL (ref 0.0–0.5)
Eosinophils Relative: 0 %
HCT: 28.1 % — ABNORMAL LOW (ref 36.0–46.0)
Hemoglobin: 9.4 g/dL — ABNORMAL LOW (ref 12.0–15.0)
Immature Granulocytes: 2 %
Lymphocytes Relative: 16 %
Lymphs Abs: 1.3 10*3/uL (ref 0.7–4.0)
MCH: 29.5 pg (ref 26.0–34.0)
MCHC: 33.5 g/dL (ref 30.0–36.0)
MCV: 88.1 fL (ref 80.0–100.0)
Monocytes Absolute: 0.6 10*3/uL (ref 0.1–1.0)
Monocytes Relative: 8 %
Neutro Abs: 5.8 10*3/uL (ref 1.7–7.7)
Neutrophils Relative %: 74 %
Platelets: 214 10*3/uL (ref 150–400)
RBC: 3.19 MIL/uL — ABNORMAL LOW (ref 3.87–5.11)
RDW: 12.4 % (ref 11.5–15.5)
WBC: 7.9 10*3/uL (ref 4.0–10.5)
nRBC: 0.4 % — ABNORMAL HIGH (ref 0.0–0.2)

## 2020-10-24 LAB — URINALYSIS, ROUTINE W REFLEX MICROSCOPIC
Bilirubin Urine: NEGATIVE
Glucose, UA: NEGATIVE mg/dL
Hgb urine dipstick: NEGATIVE
Ketones, ur: NEGATIVE mg/dL
Leukocytes,Ua: NEGATIVE
Nitrite: NEGATIVE
Protein, ur: NEGATIVE mg/dL
Specific Gravity, Urine: 1.006 (ref 1.005–1.030)
pH: 7 (ref 5.0–8.0)

## 2020-10-24 LAB — RESP PANEL BY RT-PCR (FLU A&B, COVID) ARPGX2
Influenza A by PCR: NEGATIVE
Influenza B by PCR: NEGATIVE
SARS Coronavirus 2 by RT PCR: POSITIVE — AB

## 2020-10-24 NOTE — Final Progress Note (Addendum)
Final Progress Note  Patient ID: Stefanie Sparks MRN: [id number] DOB/AGE: April 23, 1996 24 y.o.  Admit date: 10/23/2020 Admitting provider: Nadara Mustard, MD Discharge date: 10/24/2020   Admission Diagnoses: Abdominal pain in pregnancy [redacted] weeks gestation of pregnancy COVID in pregnancy, third trimester  Discharge Diagnoses:  Active Problems:   Abdominal pain in pregnancy, third trimester   COVID-19 affecting pregnancy in third trimester  Reassuring fetal heart tracing.  History of Present Illness: The patient is a 25 y.o. female G3P2002 at [redacted]w[redacted]d who presents for complaint of abdominal pain and some pelvic pressure, as well as occasional contractions. She was last seen in the hospital for this same complaint 5 days earlier and she was not dilated. She is 31 weeks 2 days gestation.Marland KitchenUpon her arrival she also complains of cough and fever, and shared that she tested positive for COVID-19  yesterday.   She denies vaginal bleeding, leaking of fluid or regular painful contractions. She has been home feeling unwell with a fever and cough since Saturday (three days ago). She reports that her 71 year old son also has COVID.  Her baby has been moving well  .Stefanie Sparks is a smoker, but denies recent cigarette smoking.  She has been self treating at home with Tylenol and bedrest. She denies any chest pain or difficulty breathing.  Past Medical History:  Diagnosis Date  . Endometriosis   . Gastroenteritis 04/06/2016  . Migraine     Past Surgical History:  Procedure Laterality Date  . WISDOM TOOTH EXTRACTION      No current facility-administered medications on file prior to encounter.   Current Outpatient Medications on File Prior to Encounter  Medication Sig Dispense Refill  . Prenatal Vit-Fe Fumarate-FA (MULTIVITAMIN-PRENATAL) 27-0.8 MG TABS tablet Take 1 tablet by mouth daily at 12 noon.      Allergies  Allergen Reactions  . Other Other (See Comments)    oranges Uncoded Allergy. Allergen:  Shellfish, Other Reaction: Other reaction    Social History   Socioeconomic History  . Marital status: Significant Other    Spouse name: Demitri  . Number of children: 2  . Years of education: Not on file  . Highest education level: Not on file  Occupational History  . Not on file  Tobacco Use  . Smoking status: Current Every Day Smoker    Packs/day: 0.25    Years: 9.00    Pack years: 2.25    Types: Cigarettes    Last attempt to quit: 04/16/2020    Years since quitting: 0.5  . Smokeless tobacco: Never Used  Vaping Use  . Vaping Use: Never used  Substance and Sexual Activity  . Alcohol use: Not Currently  . Drug use: Not Currently    Types: Marijuana    Comment: Denies any Marijuana use since October 2020  . Sexual activity: Yes    Partners: Male    Birth control/protection: None  Other Topics Concern  . Not on file  Social History Narrative  . Not on file   Social Determinants of Health   Financial Resource Strain: Not on file  Food Insecurity: Not on file  Transportation Needs: Not on file  Physical Activity: Not on file  Stress: Not on file  Social Connections: Not on file  Intimate Partner Violence: Not At Risk  . Fear of Current or Ex-Partner: No  . Emotionally Abused: No  . Physically Abused: No  . Sexually Abused: No    Family History  Problem Relation Age of  Onset  . Endometriosis Maternal Grandmother   . Diabetes Maternal Grandmother   . Endometriosis Mother   . Bipolar disorder Father   . Asthma Brother   . Bipolar disorder Brother   . Asthma Son   . Seizures Son   . Breast cancer Maternal Great-grandmother      Review of Systems  Constitutional: Positive for fever.  HENT: Negative.   Eyes: Positive for blurred vision.  Respiratory: Positive for cough.   Cardiovascular: Negative.   Gastrointestinal: Positive for diarrhea.  Genitourinary: Negative.   Musculoskeletal: Negative.   Skin: Negative.   Neurological: Negative.    Endo/Heme/Allergies: Negative.   Psychiatric/Behavioral: Negative.      Physical Exam: BP 103/62   Pulse (!) 106   Temp 99.2 F (37.3 C) (Oral)   Resp 18   Ht 4\' 11"  (1.499 m)   Wt 62.1 kg   LMP 03/19/2020 (Exact Date)   BMI 27.67 kg/m   Physical Exam Constitutional:      Appearance: She is normal weight.     Comments: Gravid patient  Genitourinary:     Vulva normal.     Genitourinary Comments: VE: closed/thick and high  HENT:     Head: Normocephalic and atraumatic.     Nose: Nose normal.  Eyes:     Extraocular Movements: Extraocular movements intact.  Cardiovascular:     Rate and Rhythm: Normal rate and regular rhythm.     Pulses: Normal pulses.     Heart sounds: Normal heart sounds.  Pulmonary:     Effort: Pulmonary effort is normal.     Breath sounds: Normal breath sounds.     Comments: Cough, no wheezing or rales Abdominal:     Palpations: Abdomen is soft.     Comments: gravid  Musculoskeletal:        General: Normal range of motion.     Cervical back: Normal range of motion and neck supple.  Neurological:     General: No focal deficit present.     Mental Status: She is alert and oriented to person, place, and time.  Skin:    General: Skin is warm and dry.  Psychiatric:        Mood and Affect: Mood normal.        Behavior: Behavior normal.     Consults: None  Significant Findings/ Diagnostic Studies: labs: Procedures: NST NST Baseline FHR: 125 beats/min Variability: moderate Accelerations: present Decelerations: absent Tocometry: rare Braxton Hicks contraction  Interpretation:  INDICATIONS: rule out uterine contractions RESULTS:  A NST procedure was performed with FHR monitoring and a normal baseline established, appropriate time of 20-40 minutes of evaluation, and accels >2 seen w 15x15 characteristics.  Results show a REACTIVE NST.    Hospital Course: The patient was admitted to Labor and Delivery Triage for observation. She was monitored and  a cervical exam was performed,  A urinalysis was performed and a CBC drawn. Her NST was reactive, with Category 1 FHTs. Her tocometer indicated rare contractions.  A rapid COVID test indicated a positive COVID test. Her oxygen saturation level was 98%, her temperature was 99.2 with a normal blood pressure. The urine did not indicate a UTI, and her WBCs were WNL.   With a Category 1 tracing, normal O2 sats and a closed cervix, she is discharged home to isolate and treat her symptoms with Tylenol q 4 hours and Mucinex for her cough. She has been carefully educated to return for care should she have any difficulty  breathing or experiences worsening respiratory symptoms. She will contact [facility] tomorrow and arrange for a  Virtual visit in the next few days.  Discharge Condition: good  Disposition:  Discharge disposition: 01-Home or Self Care       Diet: Regular diet  Discharge Activity: Ambulate in house, No lifting, driving, or strenuous exercise for 2 weeks and No driving for 2 weeks. She is to follow up with a virtual visit with [facility] in the next few days.  Discharge Instructions    Discharge activity:  Bathroom / Shower only   Complete by: As directed    Discharge activity:  Up to eat   Complete by: As directed    Discharge activity: Bedrest   Complete by: As directed    Discharge diet:  No restrictions   Complete by: As directed    Notify physician for a general feeling that "something is not right"   Complete by: As directed    Notify physician for increase or change in vaginal discharge   Complete by: As directed    Notify physician for intestinal cramps, with or without diarrhea, sometimes described as "gas pain"   Complete by: As directed    Notify physician for leaking of fluid   Complete by: As directed    Notify physician for low, dull backache, unrelieved by heat or Tylenol   Complete by: As directed    Notify physician for menstrual like cramps   Complete by:  As directed    Notify physician for pelvic pressure   Complete by: As directed    Notify physician for uterine contractions.  These may be painless and feel like the uterus is tightening or the baby is  "balling up"   Complete by: As directed    Notify physician for vaginal bleeding   Complete by: As directed    PRETERM LABOR:  Includes any of the follwing symptoms that occur between 20 - [redacted] weeks gestation.  If these symptoms are not stopped, preterm labor can result in preterm delivery, placing your baby at risk   Complete by: As directed      Allergies as of 10/24/2020      Reactions   Other Other (See Comments)   oranges Uncoded Allergy. Allergen: Shellfish, Other Reaction: Other reaction      Medication List    TAKE these medications   multivitamin-prenatal 27-0.8 MG Tabs tablet Take 1 tablet by mouth daily at 12 noon.        Total time spent taking care of this patient: 60 minutes  Signed: [name], CNM  10/24/2020, 1:31 AM

## 2020-10-24 NOTE — Discharge Instructions (Signed)
Return to hospital for any of the following symptoms:  -contractions at least 5 minutes apart -decreased fetal movement  -leakage of fluid  -vaginal bleeding

## 2020-10-24 NOTE — OB Triage Note (Signed)
Discharge instructions and labor precautions reviewed with patient, all questions answered. Patient verbalized understanding. Patient left unit ambulatory.

## 2020-10-24 NOTE — Discharge Summary (Signed)
Please see Final Progress Note.  [name], CNM  10/24/2020 12:24 AM

## 2020-10-30 ENCOUNTER — Ambulatory Visit (INDEPENDENT_AMBULATORY_CARE_PROVIDER_SITE_OTHER): Payer: Medicaid Other | Admitting: Obstetrics

## 2020-10-30 ENCOUNTER — Other Ambulatory Visit: Payer: Self-pay

## 2020-10-30 DIAGNOSIS — Z3A32 32 weeks gestation of pregnancy: Secondary | ICD-10-CM

## 2020-10-30 DIAGNOSIS — Z3483 Encounter for supervision of other normal pregnancy, third trimester: Secondary | ICD-10-CM

## 2020-10-30 DIAGNOSIS — Z3480 Encounter for supervision of other normal pregnancy, unspecified trimester: Secondary | ICD-10-CM

## 2020-10-30 NOTE — Progress Notes (Signed)
ROB Telephone- no concerns

## 2020-10-30 NOTE — Progress Notes (Signed)
Routine Prenatal Care Visit- Virtual Visit  Subjective   Virtual Visit via Telephone Note  I connected with@ on 10/30/20 at 10:10 AM EST by telephone and verified that I am speaking with the correct person using two identifiers.   I discussed the limitations, risks, security and privacy concerns of performing an evaluation and management service by telephone and the availability of in person appointments. I also discussed with the patient that there may be a patient responsible charge related to this service. The patient expressed understanding and agreed to proceed.  The patient was at home I spoke with the patient from my  Office phone The names of people involved in this encounter were: Stefanie Sparks , and Stefanie Sparks CNM .   Stefanie Sparks is a 25 y.o. G3P2002 at [redacted]w[redacted]d being seen today for ongoing prenatal care.  She is currently monitored for the following issues for this low-risk pregnancy and has Chronic pelvic pain in female; Migraine; Supervision of other normal pregnancy, antepartum; Tobacco use disorder; Pregnancy; Pelvic pain affecting pregnancy in third trimester, antepartum; Abdominal pain in pregnancy, third trimester; and COVID-19 affecting pregnancy in third trimester on their problem list.  ----------------------------------------------------------------------------------- Patient reports no complaints.  She tested poistive for COVID around the 4th of January. Contractions: Not present. Vag. Bleeding: None.  Movement: Present. Denies leaking of fluid.  ----------------------------------------------------------------------------------- The following portions of the patient's history were reviewed and updated as appropriate: allergies, current medications, past family history, past medical history, past social history, past surgical history and problem list. Problem list updated.   Objective  Last menstrual period 03/19/2020. Pregravid weight 116 lb (52.6 kg) Total  Weight Gain 21 lb (9.526 kg) Urinalysis:      Fetal Status:     Movement: Present     Physical Exam could not be performed. Because of the COVID-19 outbreak this visit was performed over the phone and not in person.   Assessment   25 y.o. G3P2002 at [redacted]w[redacted]d by  12/24/2020, Date entered prior to episode creation presenting for routine prenatal visit  Plan   pregnancy 3 Problems (from 05/04/20 to present)    Problem Noted Resolved   Pelvic pain affecting pregnancy in third trimester, antepartum 10/19/2020 by [name], CNM No   Supervision of other normal pregnancy, antepartum 05/07/2020 by [name], CNM No   Overview Addendum 10/23/2020 11:48 PM by [name], CNM    Clinic [facility] Prenatal Labs  Dating LMP Blood type: O/Positive/-- (07/15 1430)   Genetic Screen NIPS:nml XX Antibody:Negative (07/15 1430)  Anatomic [place] complete Rubella: 4.74 (07/15 1430) Varicella:Equivocal  GTT Third trimester: 95 RPR: Non Reactive (07/15 1430)   Rhogam n/a HBsAg: Negative (07/15 1430)   TDaP vaccine                  Flu Shot: HIV: Non Reactive (07/15 1430)   Baby Food                                GBS:   Contraception  Pap: 08/25/2019, NILM at [facility]  CBB  no   CS/VBAC n/a   Support Person     COVID + 10/23/2020.      Previous Version       Gestational age appropriate obstetric precautions including but not limited to vaginal bleeding, contractions, leaking of fluid and fetal movement were reviewed in detail with the patient.  Follow Up Instructions: This visit was conducted via telephone due to her having active COVID.    I discussed the assessment and treatment plan with the patient. The patient was provided an opportunity to ask questions and all were answered. The patient agreed with the plan and demonstrated an understanding of the instructions.   The patient was advised to call back or seek an in-person evaluation if the symptoms worsen or if the condition fails to  improve as anticipated.  I provided 15 minutes of non-face-to-face time during this encounter.  Return in about 2 weeks (around 11/13/2020) for return OB.  [name], CNM  [facility], [facility] 10/30/2020 10:47 AM

## 2020-11-07 ENCOUNTER — Encounter: Payer: Self-pay | Admitting: Obstetrics and Gynecology

## 2020-11-07 ENCOUNTER — Other Ambulatory Visit: Payer: Self-pay

## 2020-11-07 ENCOUNTER — Observation Stay
Admission: EM | Admit: 2020-11-07 | Discharge: 2020-11-07 | Disposition: A | Discharge status: Home / Self Care | Payer: Medicaid Other | Attending: Obstetrics and Gynecology | Admitting: Obstetrics and Gynecology

## 2020-11-07 DIAGNOSIS — Z3480 Encounter for supervision of other normal pregnancy, unspecified trimester: Secondary | ICD-10-CM

## 2020-11-07 DIAGNOSIS — O4193X0 Disorder of amniotic fluid and membranes, unspecified, third trimester, not applicable or unspecified: Principal | ICD-10-CM | POA: Insufficient documentation

## 2020-11-07 DIAGNOSIS — Z3A33 33 weeks gestation of pregnancy: Secondary | ICD-10-CM | POA: Insufficient documentation

## 2020-11-07 DIAGNOSIS — Z8616 Personal history of COVID-19: Secondary | ICD-10-CM | POA: Diagnosis not present

## 2020-11-07 DIAGNOSIS — O26893 Other specified pregnancy related conditions, third trimester: Secondary | ICD-10-CM

## 2020-11-07 LAB — RUPTURE OF MEMBRANE (ROM)PLUS: Rom Plus: NEGATIVE

## 2020-11-07 NOTE — Discharge Summary (Signed)
Physician Final Progress Note  Patient ID: Stefanie Sparks MRN: [id number] DOB/AGE: Jan 09, 1996 24 y.o.  Admit date: 11/07/2020 Admitting provider: Vena Austria, MD Discharge date: 11/07/2020   Admission Diagnoses: Leaking fluid  Discharge Diagnoses:  Active Problems:   Labor and delivery indication for care or intervention   25 y.o. [id number] at [redacted]w[redacted]d by Estimated Date of Delivery: 12/24/20 presenting with leaking of fluid earlier today.  No leaking since.  +FM, no vaginal bleeding, no contractions.  Pregnancy uncomplicated other than smoking history as well as positive COVID 10/23/2020 (mild course).  On presentation no evidence of gross rupture of membranes.  ROM plus sent and negative.  The fetus displayed a reactive fetal heart rate tracing throughout presentation.    Blood pressure 112/67, pulse 97, temperature 97.9 F (36.6 C), temperature source Oral, resp. rate 17, last menstrual period 03/19/2020.  pregnancy 3 Problems (from 05/04/20 to present)    Problem Noted Resolved   Pelvic pain affecting pregnancy in third trimester, antepartum 10/19/2020 by [name], CNM No   Supervision of other normal pregnancy, antepartum 05/07/2020 by [name], CNM No   Overview Addendum 10/23/2020 11:48 PM by [name], CNM    Clinic [facility] Prenatal Labs  Dating LMP Blood type: O/Positive/-- (07/15 1430)   Genetic Screen NIPS:nml XX Antibody:Negative (07/15 1430)  Anatomic [place] complete Rubella: 4.74 (07/15 1430) Varicella:Equivocal  GTT Third trimester: 95 RPR: Non Reactive (07/15 1430)   Rhogam n/a HBsAg: Negative (07/15 1430)   TDaP vaccine                  Flu Shot: HIV: Non Reactive (07/15 1430)   Baby Food                                GBS:   Contraception  Pap: 08/25/2019, NILM at [facility]  CBB  no   CS/VBAC n/a   Support Person     COVID + 10/23/2020.      Previous Version       Consults: None  Significant Findings/ Diagnostic Studies:  Results for orders  placed or performed during the hospital encounter of 11/07/20 (from the past 24 hour(s))  ROM Plus ([facility] only)     Status: None   Collection Time: 11/07/20  3:43 PM  Result Value Ref Range   Rom Plus NEGATIVE      Procedures: NST  Baseline: 130 Variability: moderate Accelerations: present Decelerations: absent Tocometry: N/A The patient was monitored for 30 minutes, fetal heart rate tracing was deemed reactive, category I tracing,  CPT [id number]   Discharge Condition: good  Disposition: Discharge disposition: 01-Home or Self Care       Diet: Regular diet  Discharge Activity: Activity as tolerated  Discharge Instructions    Discharge activity:  No Restrictions   Complete by: As directed    Discharge diet:  No restrictions   Complete by: As directed    No sexual activity restrictions   Complete by: As directed    Notify physician for a general feeling that "something is not right"   Complete by: As directed    Notify physician for increase or change in vaginal discharge   Complete by: As directed    Notify physician for intestinal cramps, with or without diarrhea, sometimes described as "gas pain"   Complete by: As directed    Notify physician for leaking of fluid   Complete by: As directed  Notify physician for low, dull backache, unrelieved by heat or Tylenol   Complete by: As directed    Notify physician for menstrual like cramps   Complete by: As directed    Notify physician for pelvic pressure   Complete by: As directed    Notify physician for uterine contractions.  These may be painless and feel like the uterus is tightening or the baby is  "balling up"   Complete by: As directed    Notify physician for vaginal bleeding   Complete by: As directed    PRETERM LABOR:  Includes any of the follwing symptoms that occur between 20 - [redacted] weeks gestation.  If these symptoms are not stopped, preterm labor can result in preterm delivery, placing your baby at risk    Complete by: As directed      Allergies as of 11/07/2020      Reactions   Other Other (See Comments)   oranges Uncoded Allergy. Allergen: Shellfish, Other Reaction: Other reaction      Medication List    TAKE these medications   multivitamin-prenatal 27-0.8 MG Tabs tablet Take 1 tablet by mouth daily at 12 noon.        Total time spent taking care of this patient: 30 minutes  Signed: Vena Austria 11/07/2020, 5:03 PM

## 2020-11-07 NOTE — OB Triage Note (Signed)
Pt G3P2 at [redacted]w[redacted]d c/o LOF that started about an hour and a half ago. Pt reports when she stood up from couch clear fluid saturated her pants and couch. Pt reports she feels fluid continue to leak. Denies bleeding. Pt reports 8/10 abd cramping that started a few days ago that comes and goes. VSS. Monitors applied.

## 2020-11-09 ENCOUNTER — Observation Stay
Admission: EM | Admit: 2020-11-09 | Discharge: 2020-11-09 | Disposition: A | Discharge status: Home / Self Care | Payer: Medicaid Other | Attending: Obstetrics & Gynecology | Admitting: Obstetrics & Gynecology

## 2020-11-09 ENCOUNTER — Other Ambulatory Visit: Payer: Self-pay

## 2020-11-09 ENCOUNTER — Encounter: Payer: Self-pay | Admitting: Obstetrics & Gynecology

## 2020-11-09 DIAGNOSIS — Z3A33 33 weeks gestation of pregnancy: Secondary | ICD-10-CM | POA: Diagnosis not present

## 2020-11-09 DIAGNOSIS — R1030 Lower abdominal pain, unspecified: Secondary | ICD-10-CM | POA: Diagnosis not present

## 2020-11-09 DIAGNOSIS — Z8616 Personal history of COVID-19: Secondary | ICD-10-CM | POA: Diagnosis not present

## 2020-11-09 DIAGNOSIS — R102 Pelvic and perineal pain: Secondary | ICD-10-CM

## 2020-11-09 DIAGNOSIS — O26893 Other specified pregnancy related conditions, third trimester: Secondary | ICD-10-CM | POA: Diagnosis not present

## 2020-11-09 DIAGNOSIS — Z3480 Encounter for supervision of other normal pregnancy, unspecified trimester: Secondary | ICD-10-CM

## 2020-11-09 DIAGNOSIS — O26899 Other specified pregnancy related conditions, unspecified trimester: Secondary | ICD-10-CM | POA: Diagnosis present

## 2020-11-09 LAB — FETAL FIBRONECTIN: Fetal Fibronectin: NEGATIVE

## 2020-11-09 MED ORDER — ACETAMINOPHEN 325 MG PO TABS: 650.0000 mg | ORAL_TABLET | ORAL | Status: DC | PRN

## 2020-11-09 MED ORDER — LIDOCAINE HCL (PF) 1 % IJ SOLN: 30.0000 mL | INTRAMUSCULAR | Status: DC | PRN

## 2020-11-09 MED ORDER — ONDANSETRON HCL 4 MG/2ML IJ SOLN: 4.0000 mg | Freq: Four times a day (QID) | INTRAMUSCULAR | Status: DC | PRN

## 2020-11-09 NOTE — OB Triage Note (Signed)
Pt discharged home per MD in stable condition. Discharge instructions given- pt denies questions or concerns.

## 2020-11-09 NOTE — Final Progress Note (Signed)
Physician Final Progress Note  Patient ID: Stefanie Sparks MRN: [id number] DOB/AGE: [birth year] 27 y.o.  Admit date: 11/09/2020 Admitting provider: Nadara Mustard, MD Discharge date: 11/09/2020  Admission Diagnoses:  Pregnancy related bilateral lower abdominal pain, antepartum   33 weeks  Discharge Diagnoses:  Active Problems:   Pregnancy related bilateral lower abdominal pain, antepartum   33 weeks  Consults: None  Significant Findings/ Diagnostic Studies:  Obstetrics Admission History & Physical   No chief complaint on file.   HPI:  25 y.o. G2R4270 @ [redacted]w[redacted]d (12/24/2020, Date entered prior to episode creation). Admitted on 11/09/2020:   Patient Active Problem List   Diagnosis Date Noted  . Pregnancy related bilateral lower abdominal pain, antepartum 11/09/2020  . Labor and delivery indication for care or intervention 11/07/2020  . Abdominal pain in pregnancy, third trimester 10/23/2020  . COVID-19 affecting pregnancy in third trimester   . Pregnancy 10/19/2020  . Pelvic pain affecting pregnancy in third trimester, antepartum 10/19/2020  . Supervision of other normal pregnancy, antepartum 05/07/2020  . Tobacco use disorder 05/07/2020  . Chronic pelvic pain in female 10/02/2018  . Migraine 06/17/2011     Presents for lower abdominal pains of pregnancy, no ROM or VB although has had mucus like d/c.   Prenatal care at: at [facility]. Pregnancy complicated by none.  ROS: A review of systems was performed and negative, except as stated in the above HPI. PMHx:  Past Medical History:  Diagnosis Date  . Endometriosis   . Gastroenteritis 04/06/2016  . Migraine    PSHx:  Past Surgical History:  Procedure Laterality Date  . WISDOM TOOTH EXTRACTION     Medications:  Medications Prior to Admission  Medication Sig Dispense Refill Last Dose  . Prenatal Vit-Fe Fumarate-FA (MULTIVITAMIN-PRENATAL) 27-0.8 MG TABS tablet Take 1 tablet by mouth daily at 12 noon.   11/08/2020 at Unknown  time   Allergies: is allergic to other. OBHx:  OB History  Gravida Para Term Preterm AB Living  3 2 2  0 0 2  SAB IAB Ectopic Multiple Live Births  0 0 0 0 2    # Outcome Date GA Lbr Len/2nd Weight Sex Delivery Anes PTL Lv  3 Current           2 Term 04/24/16 [redacted]w[redacted]d / 00:53 3220 g M Vag-Spont EPI  LIV  1 Term 11/03/10 [redacted]w[redacted]d  2977 g F Vag-Spont None  LIV   [redacted]w[redacted]d except as detailed in HPI.WCB:JSEGBTDV/VOHYWVPXTGGY  No family history of birth defects. Soc Hx: Alcohol: none and Recreational drug use: none  Objective:   Vitals:   11/09/20 1329  BP: 119/75  Pulse: (!) 129  Resp: 19  Temp: 98.3 F (36.8 C)   Constitutional: Well nourished, well developed female in no acute distress.  HEENT: normal Skin: Warm and dry.  Cardiovascular:Regular rate and rhythm.   Extremity: trace to 1+ bilateral pedal edema Respiratory: Clear to auscultation bilateral. Normal respiratory effort Abdomen: gravid, ND, FHT present, mild tenderness on exam Back: no CVAT Neuro: DTRs 2+, Cranial nerves grossly intact Psych: Alert and Oriented x3. No memory deficits. Normal mood and affect.  MS: normal gait, normal bilateral lower extremity ROM/strength/stability.  Pelvic exam: is not limited by body habitus EGBUS: within normal limits Vagina: within normal limits and with normal mucosa Cervix: 1/th/hi Uterus: Uterus demonstrates irritability pattern.  Adnexa: not evaluated  EFM:FHR: 140 bpm, variability: moderate,  accelerations:  Present,  decelerations:  Absent Toco: None   Assessment & Plan:  25 y.o. [id number] @ [redacted]w[redacted]d, Admitted on 11/09/2020:   Lower abdominal pain   fFN NEG   FERN NEG    Monitor fo s./sx PTL as outpt  Procedures: A NST procedure was performed with FHR monitoring and a normal baseline established, appropriate time of 20-40 minutes of evaluation, and accels >2 seen w 15x15 characteristics.  Results show a REACTIVE NST.   Discharge Condition: good  Disposition: Discharge  disposition: 01-Home or Self Care       Diet: Regular diet  Discharge Activity: Activity as tolerated  Discharge Instructions    Call MD for:   Complete by: As directed    Worsening contractions or pain; leakage of fluid; bleeding.   Diet - low sodium heart healthy   Complete by: As directed    Increase activity slowly   Complete by: As directed      Allergies as of 11/09/2020      Reactions   Other Other (See Comments)   oranges Uncoded Allergy. Allergen: Shellfish, Other Reaction: Other reaction      Medication List    TAKE these medications   multivitamin-prenatal 27-0.8 MG Tabs tablet Take 1 tablet by mouth daily at 12 noon.        Total time spent taking care of this patient: 30 minutes  Signed: Letitia Libra 11/09/2020, 3:06 PM

## 2020-11-09 NOTE — Discharge Summary (Signed)
  See FPN 

## 2020-11-09 NOTE — OB Triage Note (Signed)
Pt. presented to L/D triage with reported contractions that began a few a few weeks ago and have increased in intensity since this morning at 0400, 11/09/20. She is also feeling constant constant pelvic pressure, rated 9/10 that "feels like lightning" . She reports loss of mucus plug this morning, but no bleeding.Pt reports positive fetal movement. Pt reports LOF that has felt ongoing for a days. The fluid is thin, odorless, and clear.VSS. Monitors applied and assessing.

## 2020-11-13 ENCOUNTER — Encounter: Payer: Medicaid Other | Admitting: Obstetrics & Gynecology

## 2020-11-14 ENCOUNTER — Ambulatory Visit (INDEPENDENT_AMBULATORY_CARE_PROVIDER_SITE_OTHER): Payer: Medicaid Other | Admitting: Obstetrics and Gynecology

## 2020-11-14 ENCOUNTER — Other Ambulatory Visit: Payer: Self-pay

## 2020-11-14 VITALS — BP 110/60 | Wt 144.0 lb

## 2020-11-14 DIAGNOSIS — Z3483 Encounter for supervision of other normal pregnancy, third trimester: Secondary | ICD-10-CM

## 2020-11-14 DIAGNOSIS — Z3A34 34 weeks gestation of pregnancy: Secondary | ICD-10-CM

## 2020-11-14 DIAGNOSIS — L02214 Cutaneous abscess of groin: Secondary | ICD-10-CM

## 2020-11-14 LAB — POCT URINALYSIS DIPSTICK OB: Glucose, UA: NEGATIVE

## 2020-11-14 MED ORDER — CEPHALEXIN 500 MG PO CAPS: 500.0000 mg | ORAL_CAPSULE | Freq: Three times a day (TID) | ORAL | 0 refills | Status: DC

## 2020-11-14 NOTE — Progress Notes (Signed)
Routine Prenatal Care Visit  Subjective  Stefanie Sparks is a 25 y.o. G3P2002 at [redacted]w[redacted]d being seen today for ongoing prenatal care.  She is currently monitored for the following issues for this low-risk pregnancy and has Chronic pelvic pain in female; Migraine; Encounter for supervision of other normal pregnancy, third trimester; Tobacco use disorder; Pregnancy; Pelvic pain affecting pregnancy in third trimester, antepartum; COVID-19 affecting pregnancy in third trimester; and Pregnancy related bilateral lower abdominal pain, antepartum on their problem list.  ----------------------------------------------------------------------------------- Patient reports reports an "ingrown" hair on groin. States she has had these before and they usually drain at home, reports having to have one I&D'd once previously a few years ago. Patient states she has been soaking in epsom salt baths to help manage discomfort. Patient states she does not want it drained today, would like to manage with warm compresses and baths a little longer..   Contractions: Irregular. Vag. Bleeding: None.  Movement: Present. Denies leaking of fluid.  ----------------------------------------------------------------------------------- The following portions of the patient's history were reviewed and updated as appropriate: allergies, current medications, past family history, past medical history, past social history, past surgical history and problem list. Problem list updated.   Objective  Blood pressure 110/60, weight 144 lb (65.3 kg), last menstrual period 03/19/2020. Pregravid weight 116 lb (52.6 kg) Total Weight Gain 28 lb (12.7 kg) Urinalysis:      Fetal Status: Fetal Heart Rate (bpm): 135 Fundal Height: 35 cm Movement: Present  Presentation: Vertex  General:  Alert, oriented and cooperative. Patient is in no acute distress.  Skin: Skin is warm and dry. No rash noted.   Cardiovascular: Normal heart rate noted  Respiratory:  Normal respiratory effort, no problems with respiration noted  Abdomen: Soft, gravid, appropriate for gestational age. Pain/Pressure: Present     Pelvic:  Cervical exam deferred         Left labia majora: tender, erythematous, indurated mass, 2 cm x 4cm. Small, purulant head noted at upper portion of mass. No drainage or opening at site.   Extremities: Normal range of motion.  Edema: None  ental Status: Normal mood and affect. Normal behavior. Normal judgment and thought content.     Assessment   25 y.o. [id number] at [redacted]w[redacted]d by  12/24/2020, Date entered prior to episode creation presenting for routine prenatal visit. Patient with skin abcess present on labia majora. Patient currently declining drainage of mass.   Plan   pregnancy 3 Problems (from 05/04/20 to present)    Problem Noted Resolved   Pelvic pain affecting pregnancy in third trimester, antepartum 10/19/2020 by [name], CNM No   Encounter for supervision of other normal pregnancy, third trimester 05/07/2020 by [name], CNM No   Overview Addendum 11/14/2020  9:19 AM by [name], CNM    Clinic [facility] Prenatal Labs  Dating LMP Blood type: O/Positive/-- (07/15 1430)   Genetic Screen NIPS:nml XX Antibody:Negative (07/15 1430)  Anatomic [place] complete Rubella: 4.74 (07/15 1430) Varicella:Equivocal  GTT Third trimester: 95 RPR: Non Reactive (07/15 1430)   Rhogam n/a HBsAg: Negative (07/15 1430)   TDaP vaccine                  Flu Shot: HIV: Non Reactive (07/15 1430)   Baby Food   breast                             GBS:   Contraception  POPs Pap: 08/25/2019, NILM  at [facility]  CBB  no   CS/VBAC n/a   Support Person     COVID + 10/23/2020.      Previous Version      -Skin abscess - reviewed options for draining vs further sx management at home. Patient declines draining mass today, will try course of antibx. If sx worsen or fail to improve next week, patient will plan to f/u. -Discussed Tdap, patient desires. Needs at  next visit.  Preterm labor precautions including but not limited to vaginal bleeding, contractions, leaking of fluid and fetal movement were reviewed in detail with the patient.    Return in about 2 weeks (around 11/28/2020) for ROB.  [name], CNM, MSN [facility], [facility] 11/14/2020, 9:30 AM

## 2020-11-18 IMAGING — US US OB < 14 WEEKS - US OB TV
1 series · 14 of 28 positions shown · non-contrast
Comparison: None.

CLINICAL DATA: Beta HCG [DATE], right pelvic pain since yesterday

EXAM:
OBSTETRIC <14 WK US AND TRANSVAGINAL OB US
TECHNIQUE: Both transabdominal and transvaginal ultrasound examinations were
performed for complete evaluation of the gestation as well as the
maternal uterus, adnexal regions, and pelvic cul-de-sac.
Transvaginal technique was performed to assess early pregnancy.

[Series 1: us ob less than 14 weeks with ob transvaginal · 14 of 120 slices shown]
[im 5/120]
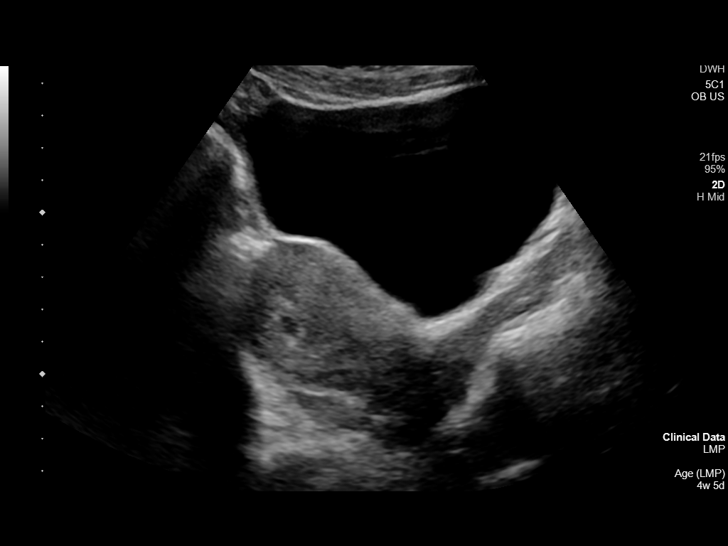
[im 14/120]
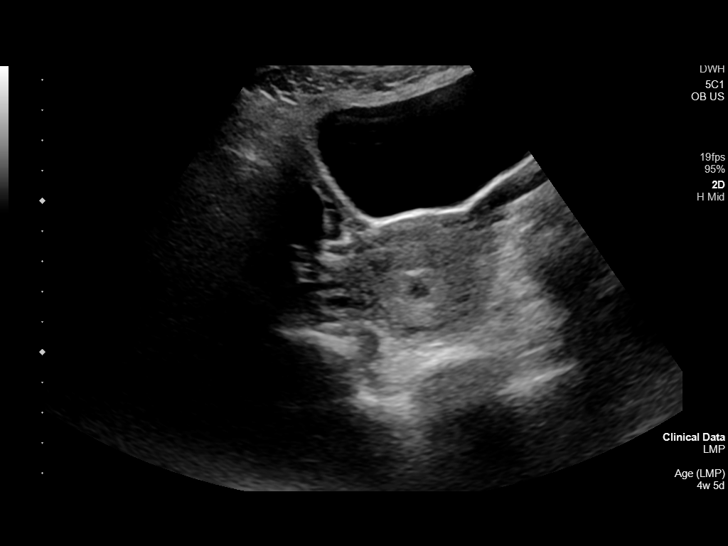
[im 23/120]
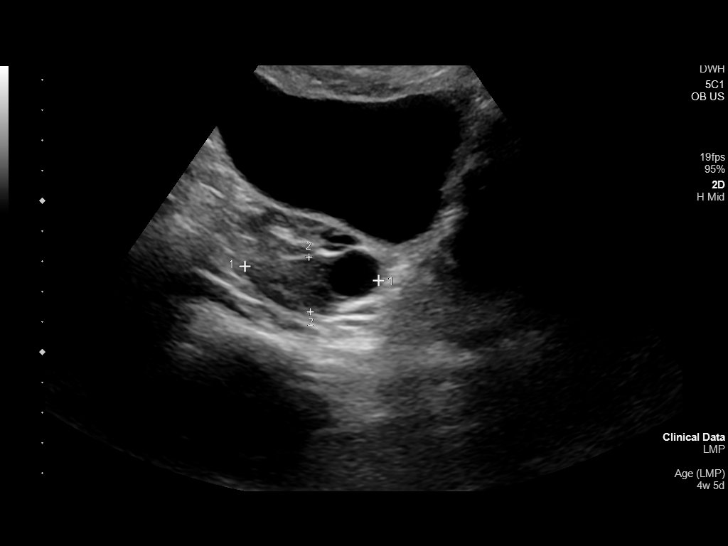
[im 31/120]
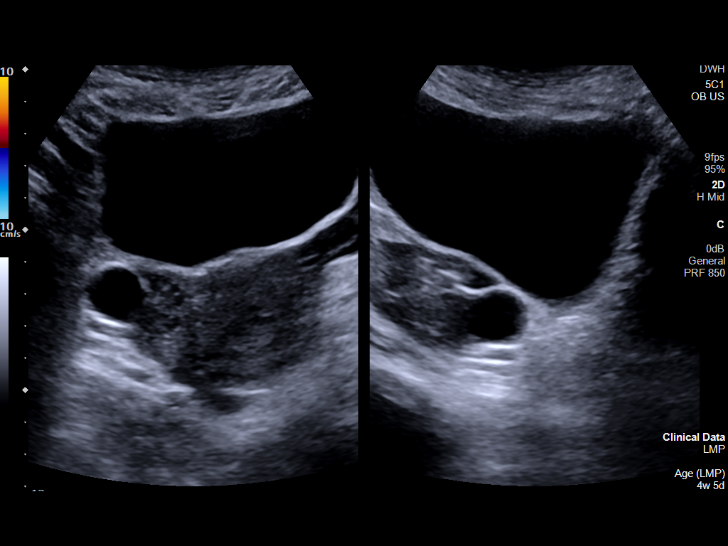
[im 40/120]
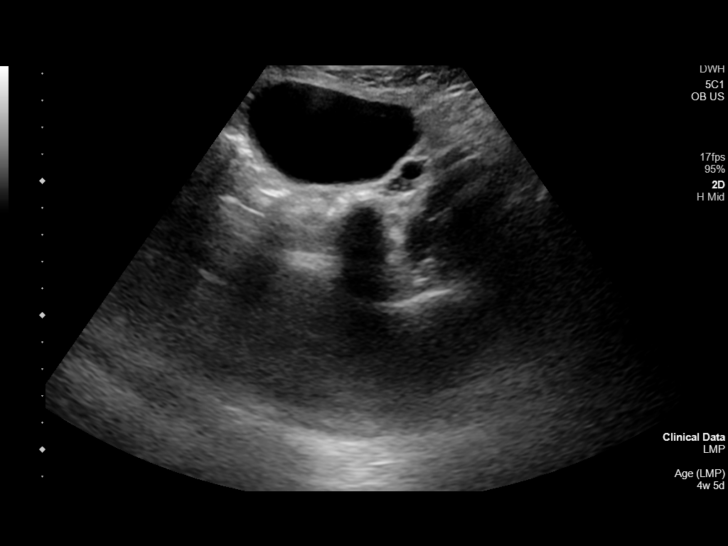
[im 49/120]
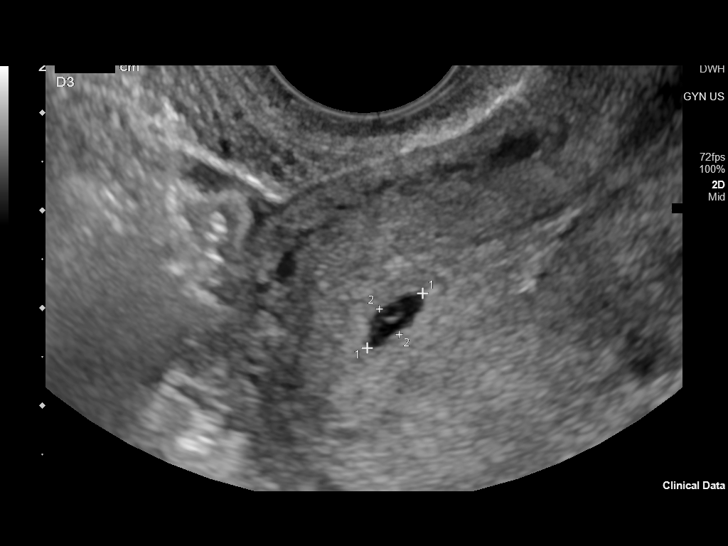
[im 58/120]
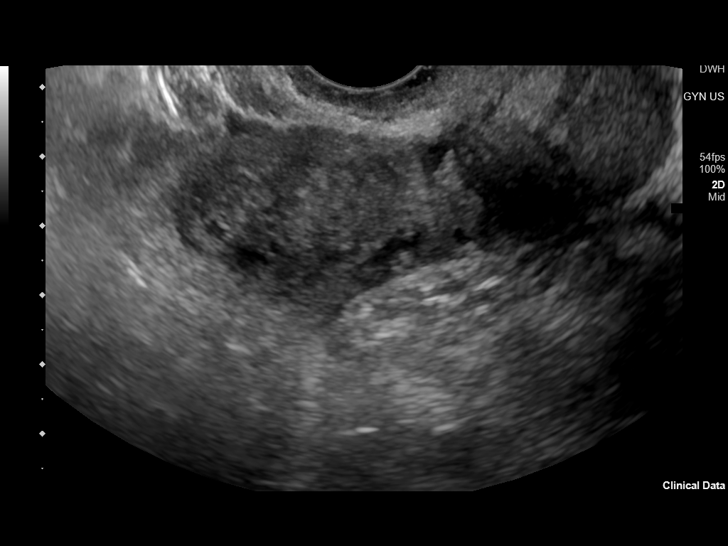
[im 67/120]
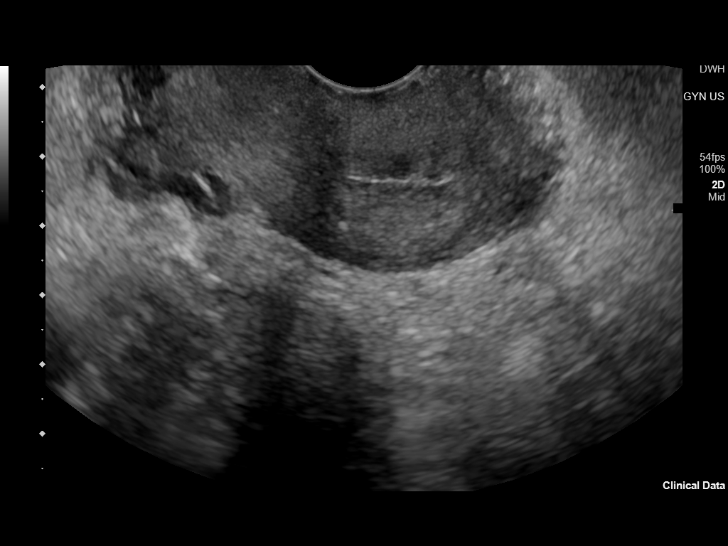
[im 75/120]
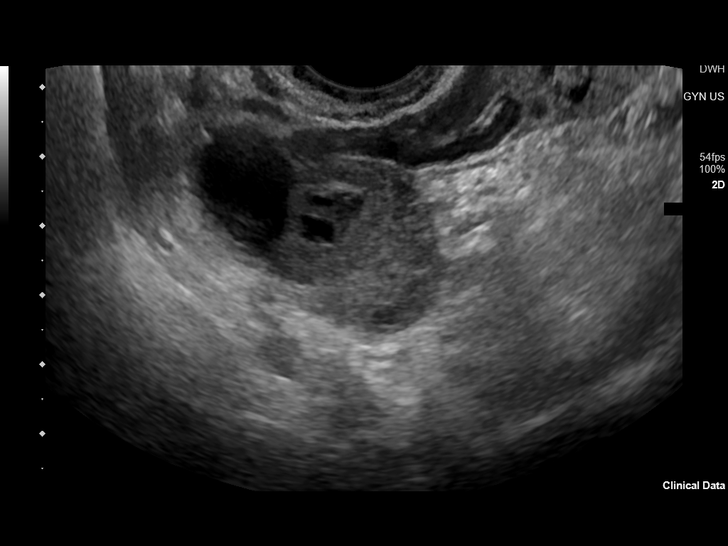
[im 84/120]
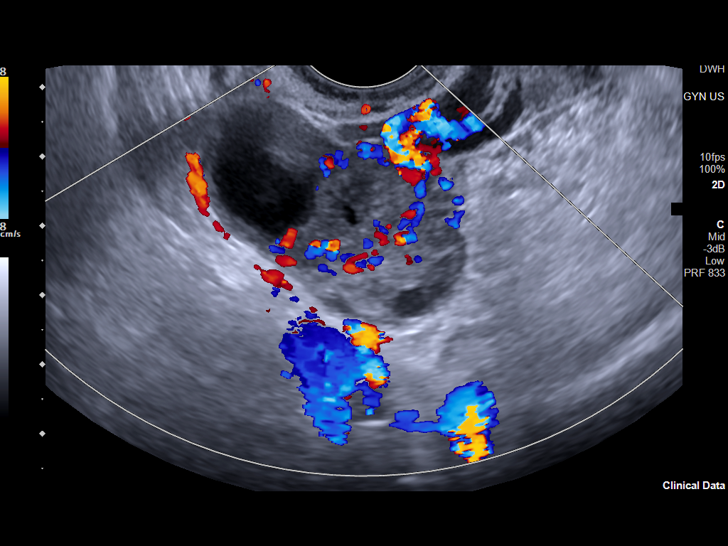
[im 93/120]
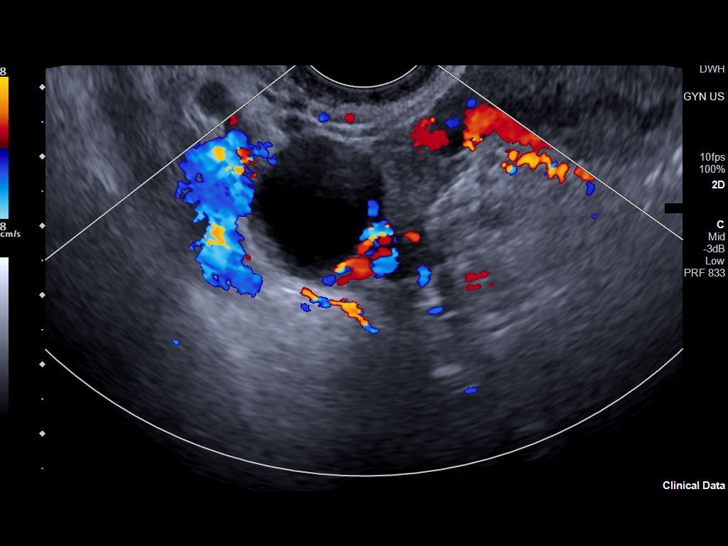
[im 102/120]
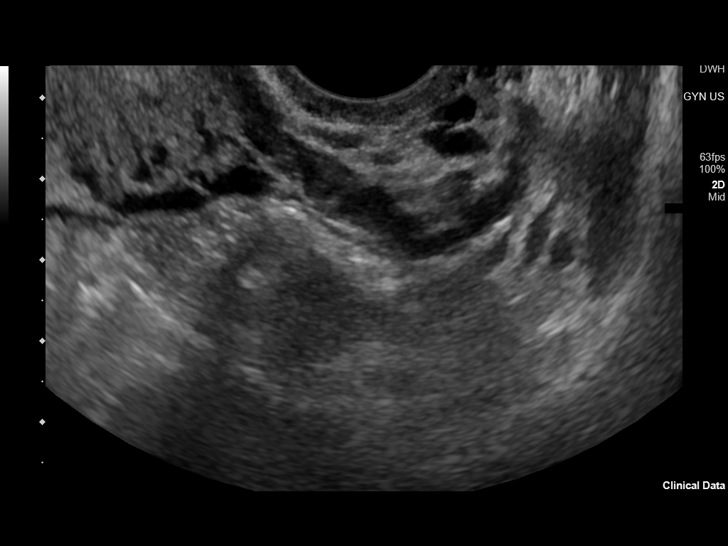
[im 111/120]
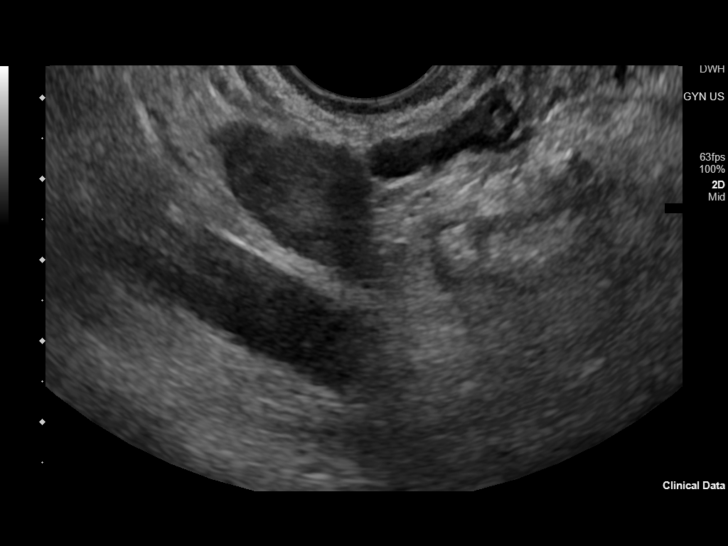
[im 120/120]
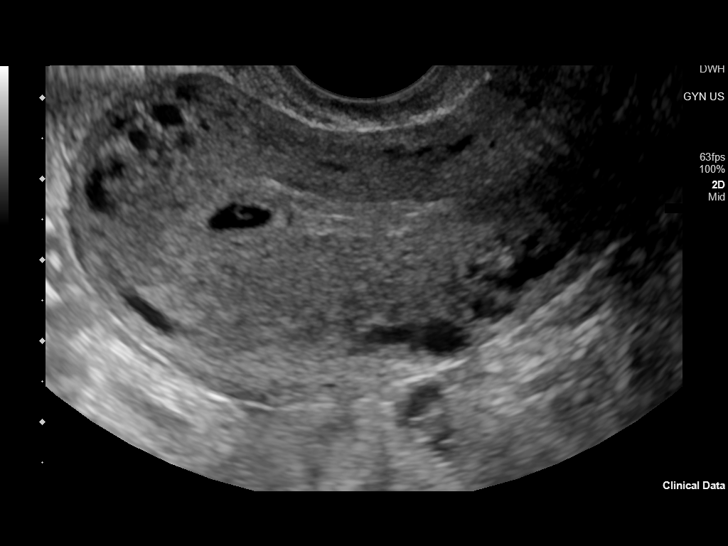

[14 of 28 positions shown; findings below may reference images not displayed]

FINDINGS: Intrauterine gestational sac: Single

Yolk sac:  Visualized.

Embryo:  Not Visualized.

Cardiac Activity: Not Visualized.

MSD: 6.9 mm   5 w   3 d

Subchorionic hemorrhage:  None visualized.

Maternal uterus/adnexae: Probable corpus luteum cyst within the
right ovary measures up to 2.1 cm. Normal follicles are seen
elsewhere within the right ovary. Left ovary is grossly normal.
There is trace free fluid within the pelvis.
IMPRESSION: 1. Single intrauterine gestational sac as above. Fetal pole is not
yet visualized. Serial beta HCG measurements and follow-up
ultrasound would be needed to document a live intrauterine
pregnancy.
2. Probable corpus luteum cyst within the right adnexa.

## 2020-11-20 ENCOUNTER — Other Ambulatory Visit: Payer: Self-pay | Admitting: Obstetrics

## 2020-11-20 ENCOUNTER — Telehealth: Payer: Self-pay

## 2020-11-20 DIAGNOSIS — K219 Gastro-esophageal reflux disease without esophagitis: Secondary | ICD-10-CM

## 2020-11-20 DIAGNOSIS — Z3A35 35 weeks gestation of pregnancy: Secondary | ICD-10-CM

## 2020-11-20 MED ORDER — LANSOPRAZOLE 30 MG PO CPDR: 30.0000 mg | DELAYED_RELEASE_CAPSULE | Freq: Every day | ORAL | 0 refills | Status: DC

## 2020-11-20 NOTE — Telephone Encounter (Signed)
Crystal,   I sent in a prescription for Prevacid to her pharmacy. [name], CNM  11/20/2020 8:56 PM

## 2020-11-20 NOTE — Telephone Encounter (Signed)
Patient requesting rx for heartburn. She has tried zantac and malox (just about everything on the recommended list). Nothing is working.  Her throat is constantly hurting from the heartburn. [phone]

## 2020-11-21 NOTE — Telephone Encounter (Signed)
Pt aware.

## 2020-11-24 ENCOUNTER — Other Ambulatory Visit: Payer: Self-pay

## 2020-11-24 ENCOUNTER — Encounter: Payer: Self-pay | Admitting: Obstetrics and Gynecology

## 2020-11-24 ENCOUNTER — Observation Stay
Admission: EM | Admit: 2020-11-24 | Discharge: 2020-11-24 | Disposition: A | Discharge status: Home / Self Care | Payer: Medicaid Other | Attending: Obstetrics and Gynecology | Admitting: Obstetrics and Gynecology

## 2020-11-24 DIAGNOSIS — Z3A35 35 weeks gestation of pregnancy: Secondary | ICD-10-CM | POA: Diagnosis not present

## 2020-11-24 DIAGNOSIS — Z3483 Encounter for supervision of other normal pregnancy, third trimester: Secondary | ICD-10-CM

## 2020-11-24 DIAGNOSIS — O26893 Other specified pregnancy related conditions, third trimester: Principal | ICD-10-CM | POA: Insufficient documentation

## 2020-11-24 DIAGNOSIS — O99891 Other specified diseases and conditions complicating pregnancy: Secondary | ICD-10-CM | POA: Diagnosis not present

## 2020-11-24 DIAGNOSIS — R102 Pelvic and perineal pain: Secondary | ICD-10-CM | POA: Diagnosis not present

## 2020-11-24 LAB — URINALYSIS, COMPLETE (UACMP) WITH MICROSCOPIC
Bilirubin Urine: NEGATIVE
Glucose, UA: NEGATIVE mg/dL
Hgb urine dipstick: NEGATIVE
Ketones, ur: NEGATIVE mg/dL
Nitrite: NEGATIVE
Protein, ur: NEGATIVE mg/dL
Specific Gravity, Urine: 1.004 — ABNORMAL LOW (ref 1.005–1.030)
pH: 7 (ref 5.0–8.0)

## 2020-11-24 LAB — CHLAMYDIA/NGC RT PCR (ARMC ONLY)
Chlamydia Tr: NOT DETECTED
N gonorrhoeae: NOT DETECTED

## 2020-11-24 LAB — WET PREP, GENITAL
Clue Cells Wet Prep HPF POC: NONE SEEN
Sperm: NONE SEEN
Trich, Wet Prep: NONE SEEN
Yeast Wet Prep HPF POC: NONE SEEN

## 2020-11-24 MED ORDER — FLUCONAZOLE 150 MG PO TABS: 150.0000 mg | ORAL_TABLET | ORAL | 0 refills | Status: AC

## 2020-11-24 NOTE — Progress Notes (Signed)
Patient admitted for observation: she is here with complaint of lower abdominal cramping that started about 1:00 AM and got worse over the day. She increased her water intake and denies dehydration. She states 4 contractions since being in triage. She admits good fetal movement. She denies vaginal bleeding or leakage of fluid other than vaginal discharge. She denies intercourse since the beginning of the pregnancy. Patient is on monitors.  Exam: Vulva appears swollen and vagina is very tender to exam. Cervix: external os is open 1 cm  BP 109/70 (BP Location: Left Arm)   Pulse (!) 111   Temp 99 F (37.2 C) (Oral)   Resp 18   LMP 03/19/2020 (Exact Date)    Toco: irregular contractions, uterine irritability Fetal well being: 150 bpm, moderate variability, +accelerations, -decelerations  Wet prep, GC/CT, UA collected  Report given to Dr Jerene Pitch who is on-call   Tresea Mall, CNM

## 2020-11-24 NOTE — Discharge Summary (Signed)
Physician Discharge Summary  Patient ID: AZRA ABRELL MRN: [id number] DOB/AGE: May 11, 1996 25 y.o.  Admit date: 11/24/2020 Discharge date: 11/24/2020  Admission Diagnoses: Pelvic pain in pregnancy, third trimester  Discharge Diagnoses:  Active Problems:   Labor and delivery, indication for care Pelvic pain in pregnancy, third trimester  Discharged Condition: good  Hospital Course: Patient was evaluated for pelvic pain in pregnancy. She reports that she has been having 3 weeks of vaginal discharge. She denies pain with urination. She has been having frequency, but this is not new. She does have a pregnancy support belt but does not frequently wear it. She is feeling normal fetal movement. She denies contractions.     Consults: None  Significant Diagnostic Studies: labs: see EPIC  Treatments: Diflucan  Discharge Exam: Blood pressure 109/70, pulse (!) 111, temperature 99 F (37.2 C), temperature source Oral, resp. rate 18, last menstrual period 03/19/2020.  Physical Exam Constitutional:      Appearance: Normal appearance.  HENT:     Head: Normocephalic and atraumatic.  Eyes:     Extraocular Movements: Extraocular movements intact.     Pupils: Pupils are equal, round, and reactive to light.  Cardiovascular:     Rate and Rhythm: Normal rate and regular rhythm.  Pulmonary:     Effort: Pulmonary effort is normal.     Breath sounds: Normal breath sounds.  Abdominal:     Comments: Gravid  Musculoskeletal:        General: Normal range of motion.  Skin:    General: Skin is warm and dry.  Neurological:     Mental Status: She is alert and oriented to person, place, and time.  Psychiatric:        Mood and Affect: Mood normal.        Thought Content: Thought content normal.        Judgment: Judgment normal.      Disposition: Discharge disposition: 01-Home or Self Care        Allergies as of 11/24/2020      Reactions   Other Other (See Comments)   oranges Uncoded  Allergy. Allergen: Shellfish, Other Reaction: Other reaction      Medication List    STOP taking these medications   cephALEXin 500 MG capsule Commonly known as: KEFLEX     TAKE these medications   fluconazole 150 MG tablet Commonly known as: DIFLUCAN Take 1 tablet (150 mg total) by mouth every 3 (three) days for 2 doses.   lansoprazole 30 MG capsule Commonly known as: Prevacid Take 1 capsule (30 mg total) by mouth daily at 12 noon.   multivitamin-prenatal 27-0.8 MG Tabs tablet Take 1 tablet by mouth daily at 12 noon.        Signed: Natale Milch 11/24/2020, 5:48 PM

## 2020-11-28 ENCOUNTER — Other Ambulatory Visit: Payer: Self-pay

## 2020-11-28 ENCOUNTER — Ambulatory Visit (INDEPENDENT_AMBULATORY_CARE_PROVIDER_SITE_OTHER): Payer: Medicaid Other | Admitting: Obstetrics

## 2020-11-28 VITALS — BP 118/74 | Wt 147.0 lb

## 2020-11-28 DIAGNOSIS — Z3A36 36 weeks gestation of pregnancy: Secondary | ICD-10-CM

## 2020-11-28 DIAGNOSIS — Z3483 Encounter for supervision of other normal pregnancy, third trimester: Secondary | ICD-10-CM

## 2020-11-28 DIAGNOSIS — Z23 Encounter for immunization: Secondary | ICD-10-CM

## 2020-11-28 NOTE — Addendum Note (Signed)
Addended by: Liliane Shi on: 11/28/2020 11:34 AM   Modules accepted: Orders

## 2020-11-28 NOTE — Progress Notes (Signed)
GBS/Aptima today. No vb. No lof.  

## 2020-11-28 NOTE — Progress Notes (Signed)
Routine Prenatal Care Visit  Subjective  Stefanie Sparks is a 25 y.o. G3P2002 at [redacted]w[redacted]d being seen today for ongoing prenatal care.  She is currently monitored for the following issues for this high-risk pregnancy and has Chronic pelvic pain in female; Migraine; Encounter for supervision of other normal pregnancy, third trimester; Tobacco use disorder; Pregnancy; Pelvic pain affecting pregnancy in third trimester, antepartum; COVID-19 affecting pregnancy in third trimester; Pregnancy related bilateral lower abdominal pain, antepartum; and Labor and delivery, indication for care on their problem list.  ----------------------------------------------------------------------------------- Patient reports backache, no bleeding, no cramping and occasional contractions.   Contractions: Not present. Vag. Bleeding: None.  Movement: Present. Leaking Fluid denies.  ----------------------------------------------------------------------------------- The following portions of the patient's history were reviewed and updated as appropriate: allergies, current medications, past family history, past medical history, past social history, past surgical history and problem list. Problem list updated.  Objective  Blood pressure 118/74, weight 147 lb (66.7 kg), last menstrual period 03/19/2020. Pregravid weight 116 lb (52.6 kg) Total Weight Gain 31 lb (14.1 kg) Urinalysis: Urine Protein    Urine Glucose    Fetal Status:     Movement: Present     General:  Alert, oriented and cooperative. Patient is in no acute distress.  Skin: Skin is warm and dry. No rash noted.   Cardiovascular: Normal heart rate noted  Respiratory: Normal respiratory effort, no problems with respiration noted  Abdomen: Soft, gravid, appropriate for gestational age. Pain/Pressure: Present     Pelvic:  Cervical exam performed       3cms/30%/-3  Extremities: Normal range of motion.     Mental Status: Normal mood and affect. Normal behavior.  Normal judgment and thought content.   Assessment   25 y.o. G3P2002 at [redacted]w[redacted]d by  12/24/2020, Date entered prior to episode creation presenting for routine prenatal visit  Plan   pregnancy 3 Problems (from 05/04/20 to present)    Problem Noted Resolved   Pelvic pain affecting pregnancy in third trimester, antepartum 10/19/2020 by [name], CNM No   Encounter for supervision of other normal pregnancy, third trimester 05/07/2020 by [name], CNM No   Overview Addendum 11/28/2020 11:05 AM by [name], CNM    Clinic [facility] Prenatal Labs  Dating LMP Blood type: O/Positive/-- (07/15 1430)   Genetic Screen NIPS:nml XX Antibody:Negative (07/15 1430)  Anatomic [place] complete Rubella: 4.74 (07/15 1430) Varicella:Equivocal  GTT Third trimester: 95 RPR: Non Reactive (07/15 1430)   Rhogam n/a HBsAg: Negative (07/15 1430)   TDaP vaccine            11/28/20      Flu Shot: HIV: Non Reactive (07/15 1430)   Baby Food   breast                             GBS:   Contraception  POPs Pap: 08/25/2019, NILM at [facility]  CBB  no   CS/VBAC n/a   Support Person     COVID + 10/23/2020.      Previous Version       Term labor symptoms and general obstetric precautions including but not limited to vaginal bleeding, contractions, leaking of fluid and fetal movement were reviewed in detail with the patient. Please refer to After Visit Summary for other counseling recommendations.  Discussed ehr 3cms dilation today. Strongly advised to quit smoking, increase her hydration. Labor precautions. TDAP today Return in about 1 week (around 12/05/2020) for return  Willene Hatchet, CNM  11/28/2020 11:05 AM

## 2020-11-29 ENCOUNTER — Encounter: Payer: Self-pay | Admitting: Obstetrics & Gynecology

## 2020-11-29 ENCOUNTER — Inpatient Hospital Stay
Admission: EM | Admit: 2020-11-29 | Discharge: 2020-11-29 | Disposition: A | Discharge status: Home / Self Care | Payer: Medicaid Other | Attending: Obstetrics & Gynecology | Admitting: Obstetrics & Gynecology

## 2020-11-29 ENCOUNTER — Other Ambulatory Visit: Payer: Self-pay

## 2020-11-29 ENCOUNTER — Telehealth: Payer: Self-pay

## 2020-11-29 DIAGNOSIS — Z3A36 36 weeks gestation of pregnancy: Secondary | ICD-10-CM | POA: Insufficient documentation

## 2020-11-29 DIAGNOSIS — R102 Pelvic and perineal pain: Secondary | ICD-10-CM | POA: Diagnosis present

## 2020-11-29 DIAGNOSIS — O26893 Other specified pregnancy related conditions, third trimester: Secondary | ICD-10-CM | POA: Diagnosis not present

## 2020-11-29 DIAGNOSIS — Z8616 Personal history of COVID-19: Secondary | ICD-10-CM | POA: Insufficient documentation

## 2020-11-29 DIAGNOSIS — Z3483 Encounter for supervision of other normal pregnancy, third trimester: Secondary | ICD-10-CM

## 2020-11-29 LAB — URINALYSIS, ROUTINE W REFLEX MICROSCOPIC
Bilirubin Urine: NEGATIVE
Glucose, UA: NEGATIVE mg/dL
Hgb urine dipstick: NEGATIVE
Ketones, ur: NEGATIVE mg/dL
Leukocytes,Ua: NEGATIVE
Nitrite: NEGATIVE
Protein, ur: NEGATIVE mg/dL
Specific Gravity, Urine: 1.014 (ref 1.005–1.030)
pH: 7 (ref 5.0–8.0)

## 2020-11-29 NOTE — Discharge Summary (Signed)
  See FPN 

## 2020-11-29 NOTE — Final Progress Note (Signed)
Physician Final Progress Note  Patient ID: Stefanie Sparks MRN: [id number] DOB/AGE: Oct 31, 1995 25 y.o.  Admit date: 11/29/2020 Admitting provider: Nadara Mustard, MD Discharge date: 11/29/2020  Admission Diagnoses: Principal Problem:   Pelvic pain affecting pregnancy in third trimester, antepartum  Discharge Diagnoses:  Principal Problem:   Pelvic pain affecting pregnancy in third trimester, antepartum   36 weeks pregnancy  Consults: None  Significant Findings/ Diagnostic Studies:  Obstetrics Admission History & Physical   No chief complaint on file.   HPI:  25 y.o. [id number] @ [redacted]w[redacted]d (12/24/2020, Date entered prior to episode creation). Admitted on 11/29/2020:   Patient Active Problem List   Diagnosis Date Noted  . Labor and delivery, indication for care 11/24/2020  . Pregnancy related bilateral lower abdominal pain, antepartum 11/09/2020  . COVID-19 affecting pregnancy in third trimester   . Pregnancy 10/19/2020  . Pelvic pain affecting pregnancy in third trimester, antepartum 10/19/2020  . Encounter for supervision of other normal pregnancy, third trimester 05/07/2020  . Tobacco use disorder 05/07/2020  . Chronic pelvic pain in female 10/02/2018  . Migraine 06/17/2011     Presents for lower abdominal pains concerning for conntractions.  She had exam yesterday in office and it has escalated mildly since that time.   No VB, but has had some mucus like d/c.   Prenatal care at: at [facility]. Pregnancy complicated by none.  ROS: A review of systems was performed and negative, except as stated in the above HPI. PMHx:  Past Medical History:  Diagnosis Date  . Endometriosis   . Gastroenteritis 04/06/2016  . Migraine    PSHx:  Past Surgical History:  Procedure Laterality Date  . WISDOM TOOTH EXTRACTION     Medications:  Medications Prior to Admission  Medication Sig Dispense Refill Last Dose  . lansoprazole (PREVACID) 30 MG capsule Take 1 capsule (30 mg total) by mouth  daily at 12 noon. 30 capsule 0 11/28/2020 at Unknown time  . Prenatal Vit-Fe Fumarate-FA (MULTIVITAMIN-PRENATAL) 27-0.8 MG TABS tablet Take 1 tablet by mouth daily at 12 noon.   11/28/2020 at Unknown time   Allergies: is allergic to other. OBHx:  OB History  Gravida Para Term Preterm AB Living  3 2 2  0 0 2  SAB IAB Ectopic Multiple Live Births  0 0 0 0 2    # Outcome Date GA Lbr Len/2nd Weight Sex Delivery Anes PTL Lv  3 Current           2 Term 04/24/16 [redacted]w[redacted]d / 00:53 3220 g M Vag-Spont EPI  LIV  1 Term 11/03/10 [redacted]w[redacted]d  2977 g F Vag-Spont None  LIV   [redacted]w[redacted]d except as detailed in HPI.HYQ:MVHQIONG/[id number]  No family history of birth defects. Soc Hx: Alcohol: none and Recreational drug use: none  Objective:   Vitals:   11/29/20 1715  BP: 117/73  Pulse: (!) 129  Resp: 20  Temp: 97.9 F (36.6 C)   Constitutional: Well nourished, well developed female in no acute distress.  HEENT: normal Skin: Warm and dry.  Cardiovascular:Regular rate and rhythm.   Extremity: trace to 1+ bilateral pedal edema Respiratory: Clear to auscultation bilateral. Normal respiratory effort Abdomen: gravid, ND, FHT present, mild tenderness on exam Back: no CVAT Neuro: DTRs 2+, Cranial nerves grossly intact Psych: Alert and Oriented x3. No memory deficits. Normal mood and affect.  MS: normal gait, normal bilateral lower extremity ROM/strength/stability.  Pelvic exam: is not limited by body habitus EGBUS: within normal limits Vagina: within normal limits and  with normal mucosa Cervix: CERVIX: 3-4/50/high Uterus: Uterus demonstrates irritability pattern.  Adnexa: not evaluated  EFM:FHR: 140 bpm, variability: moderate,  accelerations:  Present,  decelerations:  Absent Toco: Frequency: Every 10-15 minutes, irreg   Perinatal info:  Blood type: O positive Rubella- Immune Varicella -Not immune TDaP Given during third trimester of this pregnancy RPR NR / HIV Neg/ HBsAg Neg   Assessment & Plan:   25  y.o. G8T1572 @ [redacted]w[redacted]d, Admitted on 11/29/2020: Principal Problem:   Pelvic pain affecting pregnancy in third trimester, antepartum   No s/sx PTL so will hydrate, rest, monitor, and outpatient f/u  Procedures: A NST procedure was performed with FHR monitoring and a normal baseline established, appropriate time of 20-40 minutes of evaluation, and accels >2 seen w 15x15 characteristics.  Results show a REACTIVE NST.   Discharge Condition: good  Disposition: Discharge disposition: 01-Home or Self Care       Diet: Regular diet  Discharge Activity: Activity as tolerated  Discharge Instructions    Call MD for:   Complete by: As directed    Worsening contractions or pain; leakage of fluid; bleeding.   Diet - low sodium heart healthy   Complete by: As directed    Increase activity slowly   Complete by: As directed      Allergies as of 11/29/2020      Reactions   Other Other (See Comments)   oranges Uncoded Allergy. Allergen: Shellfish, Other Reaction: Other reaction      Medication List    TAKE these medications   lansoprazole 30 MG capsule Commonly known as: Prevacid Take 1 capsule (30 mg total) by mouth daily at 12 noon.   multivitamin-prenatal 27-0.8 MG Tabs tablet Take 1 tablet by mouth daily at 12 noon.       Follow-up Information    [name], CNM. Go in 1 week(s).   Specialties: Obstetrics, Gynecology Why: At [facility] as scheduled Contact information: [address] [place] [address] [phone]               Total time spent taking care of this patient: 20 minutes  Signed: Letitia Libra 11/29/2020, 7:04 PM

## 2020-11-29 NOTE — Discharge Instructions (Signed)

## 2020-11-29 NOTE — OB Triage Note (Signed)
Patient G3P2 [redacted]w[redacted]d presents to L&D with complaints of contractions since this morning. She states that around noon she lost her mucous plug and since then contractions have increased in intensity. She reports them being every 5-7 minutes apart. Patient denies vaginal bleeding, leaking of fluid and reports positive fetal movement. VSS. Monitors applied and assessing.

## 2020-11-29 NOTE — OB Triage Note (Signed)
Helane Gunther RN discharged patient home. Harris, MD went over discharge instructions and follow up care with patient. Patient agreed to plan of care and patient discharged home with support person.

## 2020-11-29 NOTE — Telephone Encounter (Signed)
FMLA/DISABILITY form for [facility] filled out, signature obtained and given to [facility] for processing.

## 2020-12-02 ENCOUNTER — Encounter: Payer: Self-pay | Admitting: Obstetrics and Gynecology

## 2020-12-02 ENCOUNTER — Observation Stay
Admission: EM | Admit: 2020-12-02 | Discharge: 2020-12-03 | Disposition: A | Discharge status: Home / Self Care | Payer: Medicaid Other | Attending: Obstetrics and Gynecology | Admitting: Obstetrics and Gynecology

## 2020-12-02 ENCOUNTER — Other Ambulatory Visit: Payer: Self-pay

## 2020-12-02 DIAGNOSIS — O99333 Smoking (tobacco) complicating pregnancy, third trimester: Secondary | ICD-10-CM | POA: Diagnosis not present

## 2020-12-02 DIAGNOSIS — O98513 Other viral diseases complicating pregnancy, third trimester: Secondary | ICD-10-CM | POA: Diagnosis present

## 2020-12-02 DIAGNOSIS — F17210 Nicotine dependence, cigarettes, uncomplicated: Secondary | ICD-10-CM | POA: Insufficient documentation

## 2020-12-02 DIAGNOSIS — R102 Pelvic and perineal pain: Secondary | ICD-10-CM

## 2020-12-02 DIAGNOSIS — Z3A36 36 weeks gestation of pregnancy: Secondary | ICD-10-CM | POA: Diagnosis not present

## 2020-12-02 DIAGNOSIS — Z3483 Encounter for supervision of other normal pregnancy, third trimester: Secondary | ICD-10-CM

## 2020-12-02 DIAGNOSIS — O26893 Other specified pregnancy related conditions, third trimester: Secondary | ICD-10-CM

## 2020-12-02 DIAGNOSIS — U071 COVID-19: Secondary | ICD-10-CM | POA: Diagnosis not present

## 2020-12-02 LAB — CBC
HCT: 28.3 % — ABNORMAL LOW (ref 36.0–46.0)
Hemoglobin: 9.3 g/dL — ABNORMAL LOW (ref 12.0–15.0)
MCH: 27.4 pg (ref 26.0–34.0)
MCHC: 32.9 g/dL (ref 30.0–36.0)
MCV: 83.5 fL (ref 80.0–100.0)
Platelets: 305 10*3/uL (ref 150–400)
RBC: 3.39 MIL/uL — ABNORMAL LOW (ref 3.87–5.11)
RDW: 13.1 % (ref 11.5–15.5)
WBC: 11.8 10*3/uL — ABNORMAL HIGH (ref 4.0–10.5)
nRBC: 0.2 % (ref 0.0–0.2)

## 2020-12-02 LAB — RUPTURE OF MEMBRANE (ROM)PLUS: Rom Plus: NEGATIVE

## 2020-12-02 LAB — TYPE AND SCREEN
ABO/RH(D): O POS
Antibody Screen: NEGATIVE

## 2020-12-02 LAB — CULTURE, BETA STREP (GROUP B ONLY): Strep Gp B Culture: NEGATIVE

## 2020-12-02 MED ORDER — OXYTOCIN-SODIUM CHLORIDE 30-0.9 UT/500ML-% IV SOLN: 2.5000 [IU]/h | INTRAVENOUS | Status: DC

## 2020-12-02 MED ORDER — LACTATED RINGERS IV SOLN: 500.0000 mL | INTRAVENOUS | Status: DC | PRN

## 2020-12-02 MED ORDER — OXYTOCIN 10 UNIT/ML IJ SOLN: 10.0000 [IU] | Freq: Once | INTRAMUSCULAR | Status: DC

## 2020-12-02 MED ORDER — LACTATED RINGERS IV SOLN: INTRAVENOUS | Status: DC

## 2020-12-02 MED ORDER — LIDOCAINE HCL (PF) 1 % IJ SOLN: 30.0000 mL | INTRAMUSCULAR | Status: DC | PRN

## 2020-12-02 MED ORDER — SOD CITRATE-CITRIC ACID 500-334 MG/5ML PO SOLN
30.0000 mL | ORAL | Status: DC | PRN
  Administered 2020-12-03: 30 mL via ORAL
  Filled 2020-12-02: qty 15

## 2020-12-02 MED ORDER — OXYTOCIN BOLUS FROM INFUSION: 333.0000 mL | Freq: Once | INTRAVENOUS | Status: DC

## 2020-12-02 MED ORDER — ONDANSETRON HCL 4 MG/2ML IJ SOLN
4.0000 mg | Freq: Four times a day (QID) | INTRAMUSCULAR | Status: DC | PRN
  Administered 2020-12-02: 4 mg via INTRAVENOUS
  Filled 2020-12-02: qty 2

## 2020-12-02 NOTE — OB Triage Note (Signed)
Patient here for LOF which started at 4 am. She reports she woke up wet and put on a pad. Pad was soaked when she woke up so she came in. She is also having ctx every 5-7 mins that mostly hurt in her back.

## 2020-12-02 NOTE — Progress Notes (Signed)
Labor Check  Subj:  Complaints: still with worsening contractions. She has ambulated and been on the ball in an effort to liberalize her activity to simulate home and reduce hip discomfort.      Obj:  BP 112/71 (BP Location: Left Arm)   Pulse 87   Temp 98.3 F (36.8 C) (Oral)   Resp 16   Ht 4\' 11"  (1.499 m)   Wt 68 kg   LMP 03/19/2020 (Exact Date)   BMI 30.28 kg/m     Cervix: Dilation: 5 / Effacement (%): 60 / Station: -2   (my 4-4.5 cm. I did not change the previous check formally, but this was my impression with my first check of her cervix).  Baseline FHR: 130 beats/min   Variability: moderate   Accelerations: present   Decelerations: absent Contractions: present frequency: 1-2 q 30 min Overall assessment: cat 1  Female chaperone present for pelvic exam:   A/P: 25 y.o. G34P2002 female at [redacted]w[redacted]d with preterm labor, no evidence of changing cervix or active labor.  Likely a prolonged prodromal state.  1.  Labor: I offered her to go home. She has been here about 11 hours with no cervical change and possibly only minimal change (based on my exam) from a few days ago.  I also offered her to stay overnight for less than 24 hour observation. She elects to stay overnight. Will remove monitors at this point with many hours of cat 1 tracing.    2.  FWB: reassuring, Overall assessment: category 1  3.  GBS negative  4.  Pain: intermittent, but not requiring medication 5.  Recheck: around 7-8 am tomorrow or as needed.    [redacted]w[redacted]d, MD, Thomasene Mohair OB/GYN, [facility] 12/02/2020 11:38 PM

## 2020-12-02 NOTE — H&P (Signed)
OB History & Physical   History of Present Illness:  Chief Complaint: leaking fluid and increased pelvic pressure   HPI:  Stefanie Sparks is a 25 y.o. G79P2002 female at [redacted]w[redacted]d dated by LMP consistent with a 7 week ultrasound.  Her pregnancy has been complicated by preterm labor, recent COVID19 infection (early January)..    She reports contractions as a back pain and more pelvic pressure.  No real discrete contractions noted.   She reports leakage of fluid.  She reports filling a pad over 3 hours with watery discharge. No gush of fluid.  She denies vaginal bleeding.   She reports fetal movement.    Total weight gain for pregnancy: 15.4 kg   Obstetrical Problem List: pregnancy 3 Problems (from 05/04/20 to present)    Problem Noted Resolved   Preterm labor in third trimester 12/02/2020 by [name], MD No   Pelvic pain affecting pregnancy in third trimester, antepartum 10/19/2020 by [name], CNM No   Encounter for supervision of other normal pregnancy, third trimester 05/07/2020 by [name], CNM No   Overview Addendum 11/28/2020 11:05 AM by [name], CNM    Clinic [facility] Prenatal Labs  Dating LMP Blood type: O/Positive/-- (07/15 1430)   Genetic Screen NIPS:nml XX Antibody:Negative (07/15 1430)  Anatomic [place] complete Rubella: 4.74 (07/15 1430) Varicella:Equivocal  GTT Third trimester: 95 RPR: Non Reactive (07/15 1430)   Rhogam n/a HBsAg: Negative (07/15 1430)   TDaP vaccine            11/28/20      Flu Shot: HIV: Non Reactive (07/15 1430)   Baby Food   breast                             GBS:  Negative at 36s2d  Contraception  POPs Pap: 08/25/2019, NILM at [facility]  CBB  no   CS/VBAC n/a   Support Person     COVID + 10/23/2020.      Previous Version       Maternal Medical History:   Past Medical History:  Diagnosis Date  . Endometriosis   . Gastroenteritis 04/06/2016  . Migraine     Past Surgical History:  Procedure Laterality Date  . WISDOM TOOTH  EXTRACTION      Allergies  Allergen Reactions  . Other Other (See Comments)    oranges Uncoded Allergy. Allergen: Shellfish, Other Reaction: Other reaction    Prior to Admission medications   Medication Sig Start Date End Date Taking? Authorizing Provider  lansoprazole (PREVACID) 30 MG capsule Take 1 capsule (30 mg total) by mouth daily at 12 noon. 11/20/20  Yes [name], CNM  Prenatal Vit-Fe Fumarate-FA (MULTIVITAMIN-PRENATAL) 27-0.8 MG TABS tablet Take 1 tablet by mouth daily at 12 noon.   Yes [provider]    OB History  Gravida Para Term Preterm AB Living  3 2 2  0 0 2  SAB IAB Ectopic Multiple Live Births  0 0 0 0 2    # Outcome Date GA Lbr Len/2nd Weight Sex Delivery Anes PTL Lv  3 Current           2 Term 04/24/16 [redacted]w[redacted]d / 00:53 3220 g M Vag-Spont EPI  LIV  1 Term 11/03/10 [redacted]w[redacted]d  2977 g F Vag-Spont None  LIV    Prenatal care site: [facility]  Social History: She  reports that she has been smoking cigarettes. She has a  2.25 pack-year smoking history. She has never used smokeless tobacco. She reports previous alcohol use. She reports previous drug use. Drug: Marijuana.  Family History: family history includes Asthma in her brother and son; Bipolar disorder in her brother and father; Breast cancer in her maternal great-grandmother; Diabetes in her maternal grandmother; Endometriosis in her maternal grandmother and mother; Seizures in her son.   Review of Systems:  Review of Systems  Constitutional: Negative.   HENT: Negative.   Eyes: Negative.   Respiratory: Negative.   Cardiovascular: Negative.   Gastrointestinal: Negative.   Genitourinary: Negative.   Musculoskeletal: Negative.   Skin: Negative.   Neurological: Negative.   Psychiatric/Behavioral: Negative.      Physical Exam:  BP 113/76 (BP Location: Left Arm)   Pulse 95   Temp 98.1 F (36.7 C) (Oral)   Resp 20   Ht 4\' 11"  (1.499 m)   Wt 68 kg   LMP 03/19/2020 (Exact Date)   BMI  30.28 kg/m   Physical Exam Constitutional:      General: She is not in acute distress.    Appearance: Normal appearance. She is well-developed.  Genitourinary:     Genitourinary Comments: CVX: 5.5 cm per RN  HENT:     Head: Normocephalic and atraumatic.  Eyes:     General: No scleral icterus.    Conjunctiva/sclera: Conjunctivae normal.  Cardiovascular:     Rate and Rhythm: Normal rate and regular rhythm.     Heart sounds: No murmur heard. No friction rub. No gallop.   Pulmonary:     Effort: Pulmonary effort is normal. No respiratory distress.     Breath sounds: Normal breath sounds. No wheezing or rales.  Abdominal:     General: Bowel sounds are normal. There is no distension.     Palpations: Abdomen is soft. There is mass (gravid, NT).     Tenderness: There is no abdominal tenderness. There is no guarding or rebound.  Musculoskeletal:        General: Normal range of motion.     Cervical back: Normal range of motion and neck supple.  Neurological:     General: No focal deficit present.     Mental Status: She is alert and oriented to person, place, and time.     Cranial Nerves: No cranial nerve deficit.  Skin:    General: Skin is warm and dry.     Findings: No erythema.  Psychiatric:        Mood and Affect: Mood normal.        Behavior: Behavior normal.        Judgment: Judgment normal.    Baseline FHR: 135 beats/min   Variability: moderate   Accelerations: present   Decelerations: present (a single deceleration with a broken tracing, likely representing a variable deceleration, short duration and shallow with moderate variability throughout with quick return to baseline). Contractions: present frequency: 1-2 q 10 min Overall assessment: cat 1 overall  Bedside Ultrasound:  Number of Fetus: 1  Presentation: cephalic  Fluid: subjectively normal  Placental Location: anterior  Lab Results  Component Value Date   SARSCOV2NAA POSITIVE (A) 10/23/2020    Assessment:   Stefanie Sparks is a 25 y.o. G14P2002 female at [redacted]w[redacted]d with preterm labor.   Plan:  1. Admit to Labor & Delivery  2. CBC, T&S, Clrs, IVF 3. GBS negative.   4. Fetwal well-being: reassuring 5. Preterm labor: will admit for observation. We discussed that we can not intervene to effect delivery  at this time, given her gestational age unless she makes progressive cervical change and delivery is imminent.  We discussed the use of steroids between [redacted]w[redacted]d-[redacted]w[redacted]d.  There is a possible small benefit of steroids at this time, as it does not appear she has received them. However, there is some theoretical concern for administration of steroids this late preterm.  Given the balance of benefits and risks, choice made to hold steroids at this time.  Collect labs and place in L&D room in case she rapidly progresses, which has been her pattern for her two other children.    Thomasene Mohair, MD 12/02/2020 12:59 PM

## 2020-12-03 DIAGNOSIS — Z3A37 37 weeks gestation of pregnancy: Secondary | ICD-10-CM

## 2020-12-03 DIAGNOSIS — O98513 Other viral diseases complicating pregnancy, third trimester: Secondary | ICD-10-CM | POA: Diagnosis not present

## 2020-12-03 LAB — RPR: RPR Ser Ql: NONREACTIVE

## 2020-12-03 NOTE — Discharge Summary (Signed)
Physician Discharge Summary  Patient ID: DEKLYNN CHARLET MRN: [id number] DOB/AGE: 1996-05-27 25 y.o.  Admit date: 12/02/2020 Admitting provider: [name], MD Discharge date: 12/03/2020   Admission Diagnoses:  Preterm labor in third trimester without delivery   Encounter for supervision of other normal pregnancy, third trimester   COVID-19 affecting pregnancy in third trimester   [redacted] weeks gestation of pregnancy  Discharge Diagnoses:  Principal Problem:   Preterm labor in third trimester without delivery Active Problems:   Encounter for supervision of other normal pregnancy, third trimester   COVID-19 affecting pregnancy in third trimester   [redacted] weeks gestation of pregnancy  History of Present Illness: Stefanie Sparks is a 25 y.o. G60P2002 female at [redacted]w[redacted]d dated by LMP consistent with a 7 week ultrasound.  Her pregnancy has been complicated by preterm labor, recent COVID19 infection (early January)..    She reports contractions as a back pain and more pelvic pressure.  No real discrete contractions noted.   She reports leakage of fluid.  She reports filling a pad over 3 hours with watery discharge. No gush of fluid.  She denies vaginal bleeding.   She reports fetal movement.     Past Medical History:  Diagnosis Date  . Endometriosis   . Gastroenteritis 04/06/2016  . Migraine     Past Surgical History:  Procedure Laterality Date  . WISDOM TOOTH EXTRACTION      No current facility-administered medications on file prior to encounter.   Current Outpatient Medications on File Prior to Encounter  Medication Sig Dispense Refill  . lansoprazole (PREVACID) 30 MG capsule Take 1 capsule (30 mg total) by mouth daily at 12 noon. 30 capsule 0  . Prenatal Vit-Fe Fumarate-FA (MULTIVITAMIN-PRENATAL) 27-0.8 MG TABS tablet Take 1 tablet by mouth daily at 12 noon.      Allergies  Allergen Reactions  . Other Other (See Comments)    oranges Uncoded Allergy. Allergen: Shellfish,  Other Reaction: Other reaction    Social History   Socioeconomic History  . Marital status: Significant Other    Spouse name: Demitri  . Number of children: 2  . Years of education: Not on file  . Highest education level: Not on file  Occupational History  . Not on file  Tobacco Use  . Smoking status: Current Some Day Smoker    Packs/day: 0.25    Years: 9.00    Pack years: 2.25    Types: Cigarettes  . Smokeless tobacco: Never Used  Vaping Use  . Vaping Use: Never used  Substance and Sexual Activity  . Alcohol use: Not Currently  . Drug use: Not Currently    Types: Marijuana    Comment: Denies any Marijuana use since October 2020  . Sexual activity: Yes    Partners: Male    Birth control/protection: None    Comment: undecided  Other Topics Concern  . Not on file  Social History Narrative  . Not on file   Social Determinants of Health   Financial Resource Strain: Not on file  Food Insecurity: Not on file  Transportation Needs: Not on file  Physical Activity: Not on file  Stress: Not on file  Social Connections: Not on file  Intimate Partner Violence: Not At Risk  . Fear of Current or Ex-Partner: No  . Emotionally Abused: No  . Physically Abused: No  . Sexually Abused: No    Family History  Problem Relation Age of Onset  . Endometriosis Maternal Grandmother   . Diabetes  Maternal Grandmother   . Endometriosis Mother   . Bipolar disorder Father   . Asthma Brother   . Bipolar disorder Brother   . Asthma Son   . Seizures Son   . Breast cancer Maternal Great-grandmother      Review of Systems  Constitutional: Negative.   HENT: Negative.   Eyes: Negative.   Respiratory: Negative.   Cardiovascular: Negative.   Gastrointestinal: Positive for abdominal pain. Negative for constipation, diarrhea, nausea and vomiting.  Genitourinary: Negative.  Negative for dysuria, frequency and urgency.       See HPI  Musculoskeletal: Positive for back pain.  Skin:  Negative.   Neurological: Negative.   Psychiatric/Behavioral: Negative.      Physical Exam: BP 110/63 (BP Location: Left Arm)   Pulse 86   Temp 98.1 F (36.7 C) (Oral)   Resp 16   Ht 4\' 11"  (1.499 m)   Wt 68 kg   LMP 03/19/2020 (Exact Date)   BMI 30.28 kg/m   Physical Exam Constitutional:      General: She is not in acute distress.    Appearance: Normal appearance. She is well-developed.  Genitourinary:     Genitourinary Comments: Cvx: 4.5 cm /70%/-3/middle/moderate (no change or no further dilation over 22 hours)  HENT:     Head: Normocephalic and atraumatic.  Eyes:     General: No scleral icterus.    Conjunctiva/sclera: Conjunctivae normal.  Cardiovascular:     Rate and Rhythm: Normal rate and regular rhythm.     Heart sounds: No murmur heard. No friction rub. No gallop.   Pulmonary:     Effort: Pulmonary effort is normal. No respiratory distress.     Breath sounds: Normal breath sounds. No wheezing or rales.  Abdominal:     General: Bowel sounds are normal. There is no distension.     Palpations: Abdomen is soft. There is no mass.     Tenderness: There is no abdominal tenderness. There is no guarding or rebound.  Musculoskeletal:        General: Normal range of motion.     Cervical back: Normal range of motion and neck supple.  Neurological:     General: No focal deficit present.     Mental Status: She is alert and oriented to person, place, and time.     Cranial Nerves: No cranial nerve deficit.  Skin:    General: Skin is warm and dry.     Findings: No erythema.  Psychiatric:        Mood and Affect: Mood normal.        Behavior: Behavior normal.        Judgment: Judgment normal.   Female chaperone present for pelvic exam:   Consults: None  Significant Findings/ Diagnostic Studies:  ROM Plus Lab Results  Component Value Date   ROMUACOMP NEGATIVE 12/02/2020    Procedures:  NST: Baseline FHR: 135 beats/min Variability: moderate Accelerations:  present Decelerations: absent Tocometry: infrequent and irregulary  Interpretation:  INDICATIONS: rule out uterine contractions RESULTS:  A NST procedure was performed with FHR monitoring and a normal baseline established, appropriate time of 20-40 minutes of evaluation, and accels >2 seen w 15x15 characteristics.  Results show a REACTIVE NST.    Hospital Course: The patient was admitted to Labor and Delivery Triage for observation. She had normal vital signs. The fetal tracing was reactive and category 1 throughout her visit.  Her Rom Plus was negative indicating no rupture of membranes, which was consistent with  internal vaginal checks with no extra fluid on the glove during any of the exams. She was monitored for about 22 hours for labor given the initial check by the RN of 5.5 cm, which represented a significant change from 3-4 on 11/29/20.  On recheck by me after about 11 hours, I found her cervix to be 4.5 cm, which likely represented no change and simply the variation in provider checks.  She was offered to be discharged. However, because she has a history of fast labors, she desired to be monitored overnight with the plan for discharge home the next morning, if her exam was unchanged.  She was rechecked the following morning at about 22 hours into her hospital stay.  Her cervix was unchanged.  Given reassuring findings and no evidence of labor, she was considered safe for discharge with follow up in clinic in 2 days. She was given precautions to have a low threshold to return to the hospital, given her history of rapid labor and her current advanced cervical dilation.  The patient stated that she felt comfortable with the plan and lives about 15 minutes away by car. The fetal presentation was verified during her stay to be cephalic.    Discharge Condition: stable  Disposition: Discharge disposition: 01-Home or Self Care       Diet: Regular diet  Discharge Activity: Activity as  tolerated   Allergies as of 12/03/2020      Reactions   Other Other (See Comments)   oranges Uncoded Allergy. Allergen: Shellfish, Other Reaction: Other reaction      Medication List    TAKE these medications   lansoprazole 30 MG capsule Commonly known as: Prevacid Take 1 capsule (30 mg total) by mouth daily at 12 noon.   multivitamin-prenatal 27-0.8 MG Tabs tablet Take 1 tablet by mouth daily at 12 noon.       Follow-up Information    [facility]. Go on 12/05/2020.   Specialty: Obstetrics and Gynecology Why: Keep routine follow up appointment Contact information: [address] [place] [place] [address] [phone]              Total time spent taking care of this patient: 60 minutes  Signed: Thomasene Mohair, MD  12/03/2020, 9:57 AM

## 2020-12-03 NOTE — Progress Notes (Signed)
Discharge home. Left floor ambulatory with her mother. Stefanie Sparks S  

## 2020-12-04 ENCOUNTER — Encounter: Payer: Self-pay | Admitting: Obstetrics and Gynecology

## 2020-12-04 ENCOUNTER — Telehealth: Payer: Self-pay

## 2020-12-04 ENCOUNTER — Observation Stay
Admission: EM | Admit: 2020-12-04 | Discharge: 2020-12-04 | Disposition: A | Discharge status: Home / Self Care | Payer: Medicaid Other | Attending: Obstetrics and Gynecology | Admitting: Obstetrics and Gynecology

## 2020-12-04 ENCOUNTER — Other Ambulatory Visit: Payer: Self-pay

## 2020-12-04 DIAGNOSIS — O26893 Other specified pregnancy related conditions, third trimester: Secondary | ICD-10-CM | POA: Diagnosis not present

## 2020-12-04 DIAGNOSIS — Z3483 Encounter for supervision of other normal pregnancy, third trimester: Secondary | ICD-10-CM

## 2020-12-04 DIAGNOSIS — O2693 Pregnancy related conditions, unspecified, third trimester: Secondary | ICD-10-CM | POA: Insufficient documentation

## 2020-12-04 DIAGNOSIS — O26853 Spotting complicating pregnancy, third trimester: Secondary | ICD-10-CM | POA: Diagnosis not present

## 2020-12-04 DIAGNOSIS — R102 Pelvic and perineal pain: Secondary | ICD-10-CM | POA: Diagnosis not present

## 2020-12-04 DIAGNOSIS — Z3A37 37 weeks gestation of pregnancy: Secondary | ICD-10-CM

## 2020-12-04 DIAGNOSIS — N939 Abnormal uterine and vaginal bleeding, unspecified: Secondary | ICD-10-CM | POA: Diagnosis present

## 2020-12-04 NOTE — Telephone Encounter (Signed)
Pt calling; ctx have p/u; bloody d/c.  [phone]  Pt states ctxs are 6-51min apart; has a lot of pressure down there; bloody mucus d/c.  Adv to go to L&D via ED.  Jacki Cones notified.

## 2020-12-04 NOTE — OB Triage Note (Signed)
Pt reports increased vaginal pressure and bloody vaginal discharge since this am.

## 2020-12-04 NOTE — Final Progress Note (Signed)
Final Progress Note  Patient ID: Stefanie Sparks MRN: [id number] DOB/AGE: 12/10/1995 25 y.o.  Admit date: 12/04/2020 Admitting provider: Natale Milch, MD Discharge date: 12/04/2020   Admission Diagnoses:  Vaginal spotting Pelvic pain affecting pregnancy in third trimester, antepartum  Discharge Diagnoses:  Active Problems:   Encounter for supervision of other normal pregnancy, third trimester   Pelvic pain affecting pregnancy in third trimester, antepartum   Vaginal spotting    History of Present Illness: The patient is a 25 y.o. female G3P2002 at [redacted]w[redacted]d by LMP c/w 7wk [place] who presents with concern for vaginal spotting and pelvic pain. The patient was seen and evaluated for 22 hours in OB triage over the weekend for preterm labor. Patient reports that she has continued to experience intermittent back pain and significant pelvic pain described as a "lightening" sensation inside her vagina. Patient states that this pain worsened this morning and that she noted vaginal bleeding with wiping after voiding. Patient states that it was a small amount of bloody discharge mixed with mucous. Patient reports +FM and denies LOF. Patient states she is unsure of if she is having contractions because she is so focused on her pelvic discomfort.  Past Medical History:  Diagnosis Date  . Endometriosis   . Gastroenteritis 04/06/2016  . Migraine     Past Surgical History:  Procedure Laterality Date  . WISDOM TOOTH EXTRACTION      No current facility-administered medications on file prior to encounter.   Current Outpatient Medications on File Prior to Encounter  Medication Sig Dispense Refill  . lansoprazole (PREVACID) 30 MG capsule Take 1 capsule (30 mg total) by mouth daily at 12 noon. 30 capsule 0  . Prenatal Vit-Fe Fumarate-FA (MULTIVITAMIN-PRENATAL) 27-0.8 MG TABS tablet Take 1 tablet by mouth daily at 12 noon.      Allergies  Allergen Reactions  . Other Other (See Comments)     oranges Uncoded Allergy. Allergen: Shellfish, Other Reaction: Other reaction    Social History   Socioeconomic History  . Marital status: Significant Other    Spouse name: Demitri  . Number of children: 2  . Years of education: Not on file  . Highest education level: Not on file  Occupational History  . Not on file  Tobacco Use  . Smoking status: Current Some Day Smoker    Packs/day: 0.25    Years: 9.00    Pack years: 2.25    Types: Cigarettes  . Smokeless tobacco: Never Used  Vaping Use  . Vaping Use: Never used  Substance and Sexual Activity  . Alcohol use: Not Currently  . Drug use: Not Currently    Types: Marijuana    Comment: Denies any Marijuana use since October 2020  . Sexual activity: Yes    Partners: Male    Birth control/protection: None    Comment: undecided  Other Topics Concern  . Not on file  Social History Narrative  . Not on file   Social Determinants of Health   Financial Resource Strain: Not on file  Food Insecurity: Not on file  Transportation Needs: Not on file  Physical Activity: Not on file  Stress: Not on file  Social Connections: Not on file  Intimate Partner Violence: Not At Risk  . Fear of Current or Ex-Partner: No  . Emotionally Abused: No  . Physically Abused: No  . Sexually Abused: No    Family History  Problem Relation Age of Onset  . Endometriosis Maternal Grandmother   . Diabetes  Maternal Grandmother   . Endometriosis Mother   . Bipolar disorder Father   . Asthma Brother   . Bipolar disorder Brother   . Asthma Son   . Seizures Son   . Breast cancer Maternal Great-grandmother      Review of Systems  Constitutional: Negative.   HENT: Negative.   Eyes: Negative.   Respiratory: Negative.   Cardiovascular: Negative.   Gastrointestinal: Negative.   Genitourinary: Negative for dysuria, flank pain, frequency and urgency.       Pelvic pain, vaginal spotting with wiping  Musculoskeletal: Positive for back pain.  Skin:  Negative.   Neurological: Negative.   Endo/Heme/Allergies: Negative.   Psychiatric/Behavioral: Negative.      Physical Exam: BP 99/69   Pulse 100   Temp 98.2 F (36.8 C) (Oral)   Resp 16   LMP 03/19/2020 (Exact Date)   Physical Exam Constitutional:      General: She is in acute distress.     Appearance: Normal appearance. She is not toxic-appearing.  Genitourinary:     Genitourinary Comments: SVE: 4.5/70/-mid/soft Scant amount of white discharge  HENT:     Head: Normocephalic.     Nose: Nose normal.  Eyes:     Pupils: Pupils are equal, round, and reactive to light.  Cardiovascular:     Rate and Rhythm: Normal rate and regular rhythm.  Pulmonary:     Effort: Pulmonary effort is normal.  Abdominal:     Palpations: There is mass.     Tenderness: There is abdominal tenderness.     Comments: Gravid, size c/w dates  Musculoskeletal:        General: Normal range of motion.  Neurological:     General: No focal deficit present.     Mental Status: She is alert and oriented to person, place, and time.  Skin:    General: Skin is warm and dry.  Psychiatric:        Mood and Affect: Mood normal.        Behavior: Behavior normal.  Vitals and nursing note reviewed.     Consults: None  Significant Findings/ Diagnostic Studies: n/a (labs reviewed from observation stay on 12/02/20)  Procedures: NST  NONSTRESS TEST INTERPRETATION INDICATIONS: rule out uterine contractions FHR baseline: 130 RESULTS:  A NST procedure was performed with FHR monitoring and a normal baseline established, appropriate time of 20-40 minutes of evaluation, and accels >2 seen w 15x15 characteristics.  Results show a REACTIVE NST.  Toco: uterine irritability noted with irregular contractions every 6-7 mins  Hospital Course: The patient was admitted to Labor and Delivery Triage for observation. SVE unchanged from evaluation on 12/03/20, no bloody discharge noted during the exam. Discussed finding with patient  and potential source of vaginal spotting with mucous discharge, including loss of mucous plug vs frequent cervical exams over the last two days. NST reactive. VSS. Patient reassured. Discussed management of pelvic discomfort at home including tylenol, warm baths/showers, OTC Unisom to aid with sleep. Patient to f/u tomorrow at [facility] for previously scheduled appointment. S&S of labor reviewed including when to RTC.  Discharge Condition: good  Discharge disposition: 01-Home or Self Care  Diet: Regular diet  Discharge Activity: Activity as tolerated   Allergies as of 12/04/2020      Reactions   Other Other (See Comments)   oranges Uncoded Allergy. Allergen: Shellfish, Other Reaction: Other reaction      Medication List    TAKE these medications   lansoprazole 30 MG capsule  Commonly known as: Prevacid Take 1 capsule (30 mg total) by mouth daily at 12 noon.   multivitamin-prenatal 27-0.8 MG Tabs tablet Take 1 tablet by mouth daily at 12 noon.        Total time spent taking care of this patient: 15 minutes  Signed:  [name], CNM 12/04/2020, 4:18 PM

## 2020-12-04 NOTE — Progress Notes (Signed)
Discharge instructions reviewed with the patient, patient verbalizes understanding. Patient ambulatory and discharged home.

## 2020-12-04 NOTE — OB Triage Note (Signed)
Patient is G3P2, [redacted]w[redacted]d that presents with pelvic pressure and noticed some bloody discharge when she wipes after urinating. VSS. Monitors applied and assessing.

## 2020-12-04 NOTE — Discharge Summary (Signed)
See final progress note. 

## 2020-12-05 ENCOUNTER — Ambulatory Visit (INDEPENDENT_AMBULATORY_CARE_PROVIDER_SITE_OTHER): Payer: Medicaid Other | Admitting: Obstetrics

## 2020-12-05 VITALS — BP 108/70 | Wt 151.0 lb

## 2020-12-05 DIAGNOSIS — Z3A37 37 weeks gestation of pregnancy: Secondary | ICD-10-CM

## 2020-12-05 DIAGNOSIS — Z3480 Encounter for supervision of other normal pregnancy, unspecified trimester: Secondary | ICD-10-CM

## 2020-12-05 DIAGNOSIS — Z3483 Encounter for supervision of other normal pregnancy, third trimester: Secondary | ICD-10-CM

## 2020-12-05 LAB — POCT URINALYSIS DIPSTICK OB: Glucose, UA: NEGATIVE

## 2020-12-05 NOTE — Progress Notes (Signed)
Seen at L&D & had cervix check psat three days. Declines cervix check today.

## 2020-12-05 NOTE — Progress Notes (Signed)
Routine Prenatal Care Visit  Subjective  Stefanie Sparks is a 25 y.o. G3P2002 at [redacted]w[redacted]d being seen today for ongoing prenatal care.  She is currently monitored for the following issues for this low-risk pregnancy and has Chronic pelvic pain in female; Migraine; Encounter for supervision of other normal pregnancy, third trimester; Tobacco use disorder; Pregnancy; Pelvic pain affecting pregnancy in third trimester, antepartum; COVID-19 affecting pregnancy in third trimester; Pregnancy related bilateral lower abdominal pain, antepartum; Labor and delivery, indication for care; Preterm labor in third trimester; [redacted] weeks gestation of pregnancy; and Vaginal spotting on their problem list.  ----------------------------------------------------------------------------------- Patient reports no bleeding, no cramping, no leaking, occasional contractions and and that she has been to the unit several times for pelvic pressure and contractions..   Contractions: Irregular. Vag. Bleeding: None.  Movement: Present. Leaking Fluid denies.  ----------------------------------------------------------------------------------- The following portions of the patient's history were reviewed and updated as appropriate: allergies, current medications, past family history, past medical history, past social history, past surgical history and problem list. Problem list updated.  Objective  Blood pressure 108/70, weight 151 lb (68.5 kg), last menstrual period 03/19/2020. Pregravid weight 116 lb (52.6 kg) Total Weight Gain 35 lb (15.9 kg) Urinalysis: Urine Protein Trace  Urine Glucose Negative  Fetal Status:     Movement: Present     General:  Alert, oriented and cooperative. Patient is in no acute distress.  Skin: Skin is warm and dry. No rash noted.   Cardiovascular: Normal heart rate noted  Respiratory: Normal respiratory effort, no problems with respiration noted  Abdomen: Soft, gravid, appropriate for gestational age.  Pain/Pressure: Present     Pelvic:  Cervical exam performed      3-3.5/60%/-3 station. Cervix is soft and anterior. forebag felt.  Extremities: Normal range of motion.     Mental Status: Normal mood and affect. Normal behavior. Normal judgment and thought content.   Assessment   25 y.o. G3P2002 at [redacted]w[redacted]d by  12/24/2020, Date entered prior to episode creation presenting for routine prenatal visit  Plan   pregnancy 3 Problems (from 05/04/20 to present)    Problem Noted Resolved   Vaginal spotting 12/04/2020 by [name], CNM No   Preterm labor in third trimester 12/02/2020 by [name], MD No   Pelvic pain affecting pregnancy in third trimester, antepartum 10/19/2020 by [name], CNM No   Encounter for supervision of other normal pregnancy, third trimester 05/07/2020 by [name], CNM No   Overview Addendum 12/04/2020  2:06 PM by [name], CNM    Clinic [facility] Prenatal Labs  Dating LMP Blood type: O/Positive/-- (07/15 1430)   Genetic Screen NIPS:nml XX Antibody:Negative (07/15 1430)  Anatomic [place] complete Rubella: 4.74 (07/15 1430) Varicella:Equivocal  GTT Third trimester: 95 RPR: Non Reactive (07/15 1430)   Rhogam n/a HBsAg: Negative (07/15 1430)   TDaP vaccine            11/28/20      Flu Shot: HIV: Non Reactive (07/15 1430)   Baby Food   breast                             GBS: negative  Contraception  POPs Pap: 08/25/2019, NILM at [facility]  CBB  no   CS/VBAC n/a   Support Person     COVID + 10/23/2020.      Previous Version       Term labor symptoms and general obstetric precautions including  but not limited to vaginal bleeding, contractions, leaking of fluid and fetal movement were reviewed in detail with the patient. Please refer to After Visit Summary for other counseling recommendations.  Discussed her cervical exam and how to handle the last few weeks of her pregnancy.  Return in about 1 week (around 12/12/2020) for return OB.  [name],  CNM  12/05/2020 2:38 PM

## 2020-12-09 ENCOUNTER — Other Ambulatory Visit: Payer: Self-pay

## 2020-12-09 ENCOUNTER — Observation Stay
Admission: EM | Admit: 2020-12-09 | Discharge: 2020-12-09 | Disposition: A | Discharge status: Home / Self Care | Payer: Medicaid Other | Attending: Obstetrics and Gynecology | Admitting: Obstetrics and Gynecology

## 2020-12-09 ENCOUNTER — Encounter: Payer: Self-pay | Admitting: Obstetrics and Gynecology

## 2020-12-09 DIAGNOSIS — F17210 Nicotine dependence, cigarettes, uncomplicated: Secondary | ICD-10-CM | POA: Diagnosis not present

## 2020-12-09 DIAGNOSIS — Z3483 Encounter for supervision of other normal pregnancy, third trimester: Secondary | ICD-10-CM

## 2020-12-09 DIAGNOSIS — O4193X0 Disorder of amniotic fluid and membranes, unspecified, third trimester, not applicable or unspecified: Secondary | ICD-10-CM | POA: Diagnosis not present

## 2020-12-09 DIAGNOSIS — O99333 Smoking (tobacco) complicating pregnancy, third trimester: Secondary | ICD-10-CM | POA: Insufficient documentation

## 2020-12-09 DIAGNOSIS — O26893 Other specified pregnancy related conditions, third trimester: Secondary | ICD-10-CM

## 2020-12-09 DIAGNOSIS — Z3A37 37 weeks gestation of pregnancy: Secondary | ICD-10-CM

## 2020-12-09 DIAGNOSIS — N939 Abnormal uterine and vaginal bleeding, unspecified: Secondary | ICD-10-CM

## 2020-12-09 DIAGNOSIS — R102 Pelvic and perineal pain: Secondary | ICD-10-CM

## 2020-12-09 LAB — RUPTURE OF MEMBRANE (ROM)PLUS: Rom Plus: NEGATIVE

## 2020-12-09 MED ORDER — ACETAMINOPHEN 325 MG PO TABS
650.0000 mg | ORAL_TABLET | Freq: Once | ORAL | Status: AC
  Administered 2020-12-09: 650 mg via ORAL

## 2020-12-09 MED ORDER — ACETAMINOPHEN 325 MG PO TABS
ORAL_TABLET | ORAL | Status: AC
  Filled 2020-12-09: qty 2

## 2020-12-09 NOTE — OB Triage Note (Signed)
Discharge instructions reviewed and pt verbalized understanding. Red flag labor precautions reviewed. Pt stable at time of discharge home with mother. All questions answered.

## 2020-12-09 NOTE — OB Triage Note (Signed)
Pt is a G3P2 and [redacted]w[redacted]d and pt states she has had ctx all day but they have worsened in the last 1.5 hour. Pt states around 0000 she got up from the couch and noticed her underwear were wet and could not assess the color of fluid. Pt confirms positive fetal movement and denies VB. VSS. Monitors applied and assessing.

## 2020-12-09 NOTE — Discharge Summary (Signed)
Physician Final Progress Note  Patient ID: AGAPITA SAVARINO MRN: [id number] DOB/AGE: [birth year] 25 y.o.  Admit date: 12/09/2020 Admitting provider: Tresea Mall, CNM Discharge date: 12/09/2020   Admission Diagnoses: fluid leaking  Discharge Diagnoses:  Active Problems:   Labor and delivery, indication for care 37 weeks IUP  History of Present Illness: The patient is a 25 y.o. female G3P2002 at [redacted]w[redacted]d who presents for possible amniotic fluid leaking around midnight. She had irregular contractions throughout the day that she states worsened after midnight. She reports good fetal movement and denies vaginal bleeding.   ROM plus negative. Patient was offered extended observation to check for cervical change. She elected to discharge to home.   Past Medical History:  Diagnosis Date  . Endometriosis   . Gastroenteritis 04/06/2016  . Migraine     Past Surgical History:  Procedure Laterality Date  . WISDOM TOOTH EXTRACTION      No current facility-administered medications on file prior to encounter.   Current Outpatient Medications on File Prior to Encounter  Medication Sig Dispense Refill  . lansoprazole (PREVACID) 30 MG capsule Take 1 capsule (30 mg total) by mouth daily at 12 noon. 30 capsule 0  . Prenatal Vit-Fe Fumarate-FA (MULTIVITAMIN-PRENATAL) 27-0.8 MG TABS tablet Take 1 tablet by mouth daily at 12 noon.      Allergies  Allergen Reactions  . Other Other (See Comments)    oranges Uncoded Allergy. Allergen: Shellfish, Other Reaction: Other reaction    Social History   Socioeconomic History  . Marital status: Significant Other    Spouse name: Demitri  . Number of children: 2  . Years of education: Not on file  . Highest education level: Not on file  Occupational History  . Not on file  Tobacco Use  . Smoking status: Current Some Day Smoker    Packs/day: 0.25    Years: 9.00    Pack years: 2.25    Types: Cigarettes  . Smokeless tobacco: Never Used  Vaping Use   . Vaping Use: Never used  Substance and Sexual Activity  . Alcohol use: Not Currently  . Drug use: Not Currently    Types: Marijuana    Comment: Denies any Marijuana use since October 2020  . Sexual activity: Yes    Partners: Male    Birth control/protection: I.U.D.  Other Topics Concern  . Not on file  Social History Narrative  . Not on file   Social Determinants of Health   Financial Resource Strain: Not on file  Food Insecurity: Not on file  Transportation Needs: Not on file  Physical Activity: Not on file  Stress: Not on file  Social Connections: Not on file  Intimate Partner Violence: Not At Risk  . Fear of Current or Ex-Partner: No  . Emotionally Abused: No  . Physically Abused: No  . Sexually Abused: No    Family History  Problem Relation Age of Onset  . Endometriosis Maternal Grandmother   . Diabetes Maternal Grandmother   . Endometriosis Mother   . Bipolar disorder Father   . Asthma Brother   . Bipolar disorder Brother   . Asthma Son   . Seizures Son   . Breast cancer Maternal Great-grandmother      Review of Systems  Constitutional: Negative for chills and fever.  HENT: Negative for congestion, ear discharge, ear pain, hearing loss, sinus pain and sore throat.   Eyes: Negative for blurred vision and double vision.  Respiratory: Negative for cough, shortness of breath and  wheezing.   Cardiovascular: Negative for chest pain, palpitations and leg swelling.  Gastrointestinal: Positive for abdominal pain. Negative for blood in stool, constipation, diarrhea, heartburn, melena, nausea and vomiting.  Genitourinary: Negative for dysuria, flank pain, frequency, hematuria and urgency.  Musculoskeletal: Negative for back pain, joint pain and myalgias.  Skin: Negative for itching and rash.  Neurological: Negative for dizziness, tingling, tremors, sensory change, speech change, focal weakness, seizures, loss of consciousness, weakness and headaches.   Endo/Heme/Allergies: Negative for environmental allergies. Does not bruise/bleed easily.  Psychiatric/Behavioral: Negative for depression, hallucinations, memory loss, substance abuse and suicidal ideas. The patient is not nervous/anxious and does not have insomnia.      Physical Exam: BP 118/74 (BP Location: Right Arm)   Pulse 99   Temp 98 F (36.7 C) (Oral)   Resp 16   Ht 4\' 11"  (1.499 m)   Wt 68.9 kg   LMP 03/19/2020 (Exact Date)   BMI 30.70 kg/m   Constitutional: Well nourished, well developed female in no acute distress.  HEENT: normal Skin: Warm and dry.  Cardiovascular: Regular rate and rhythm.   Extremity: no edema  Respiratory: Clear to auscultation bilateral. Normal respiratory effort Abdomen: FHT present Back: no CVAT Neuro: DTRs 2+, Cranial nerves grossly intact Psych: Alert and Oriented x3. No memory deficits. Normal mood and affect.  MS: normal gait, normal bilateral lower extremity ROM/strength/stability.  Pelvic exam: per RN S. 03/21/2020 3.5/50/-2  Toco: irregular every 2-5 Fetal well being: 135 bpm, moderate variability, +accelerations, -decelerations  Consults: None  Significant Findings/ Diagnostic Studies: labs:  Results for CHAUNTAE, HULTS (MRN Otho Darner) as of 12/09/2020 03:02  Ref. Range 12/09/2020 02:10  Rom Plus Unknown NEGATIVE    Procedures: NST  Hospital Course: The patient was admitted to Labor and Delivery Triage for observation.   Discharge Condition: good  Disposition: Discharge disposition: 01-Home or Self Care  Diet: Regular diet  Discharge Activity: Activity as tolerated  Discharge Instructions    Discharge activity:  No Restrictions   Complete by: As directed    Discharge diet:  No restrictions   Complete by: As directed    Fetal Kick Count:  Lie on our left side for one hour after a meal, and count the number of times your baby kicks.  If it is less than 5 times, get up, move around and drink some juice.  Repeat the test  30 minutes later.  If it is still less than 5 kicks in an hour, notify your doctor.   Complete by: As directed    LABOR:  When conractions begin, you should start to time them from the beginning of one contraction to the beginning  of the next.  When contractions are 5 - 10 minutes apart or less and have been regular for at least an hour, you should call your health care provider.   Complete by: As directed    No sexual activity restrictions   Complete by: As directed    Notify physician for bleeding from the vagina   Complete by: As directed    Notify physician for blurring of vision or spots before the eyes   Complete by: As directed    Notify physician for chills or fever   Complete by: As directed    Notify physician for fainting spells, "black outs" or loss of consciousness   Complete by: As directed    Notify physician for increase in vaginal discharge   Complete by: As directed    Notify physician  for leaking of fluid   Complete by: As directed    Notify physician for pain or burning when urinating   Complete by: As directed    Notify physician for pelvic pressure (sudden increase)   Complete by: As directed    Notify physician for severe or continued nausea or vomiting   Complete by: As directed    Notify physician for sudden gushing of fluid from the vagina (with or without continued leaking)   Complete by: As directed    Notify physician for sudden, constant, or occasional abdominal pain   Complete by: As directed    Notify physician if baby moving less than usual   Complete by: As directed      Allergies as of 12/09/2020      Reactions   Other Other (See Comments)   oranges Uncoded Allergy. Allergen: Shellfish, Other Reaction: Other reaction      Medication List    TAKE these medications   lansoprazole 30 MG capsule Commonly known as: Prevacid Take 1 capsule (30 mg total) by mouth daily at 12 noon.   multivitamin-prenatal 27-0.8 MG Tabs tablet Take 1 tablet by  mouth daily at 12 noon.       Follow-up Information    [facility]. Go to.   Specialty: Obstetrics and Gynecology Why: scheduled prenatal appointment Contact information: [address] [place] [address] [phone]              Total time spent taking care of this patient: 20 minutes  Signed: Tresea Mall, CNM  12/09/2020, 2:55 AM

## 2020-12-12 ENCOUNTER — Encounter: Payer: Self-pay | Admitting: Obstetrics and Gynecology

## 2020-12-12 ENCOUNTER — Observation Stay
Admission: EM | Admit: 2020-12-12 | Discharge: 2020-12-13 | Disposition: A | Discharge status: Home / Self Care | Payer: Medicaid Other | Attending: Obstetrics and Gynecology | Admitting: Obstetrics and Gynecology

## 2020-12-12 ENCOUNTER — Other Ambulatory Visit: Payer: Self-pay

## 2020-12-12 ENCOUNTER — Ambulatory Visit (INDEPENDENT_AMBULATORY_CARE_PROVIDER_SITE_OTHER): Payer: Medicaid Other | Admitting: Obstetrics

## 2020-12-12 VITALS — BP 118/74 | Wt 151.0 lb

## 2020-12-12 DIAGNOSIS — R102 Pelvic and perineal pain: Secondary | ICD-10-CM

## 2020-12-12 DIAGNOSIS — Z3A38 38 weeks gestation of pregnancy: Secondary | ICD-10-CM | POA: Diagnosis not present

## 2020-12-12 DIAGNOSIS — N939 Abnormal uterine and vaginal bleeding, unspecified: Secondary | ICD-10-CM

## 2020-12-12 DIAGNOSIS — Z3480 Encounter for supervision of other normal pregnancy, unspecified trimester: Secondary | ICD-10-CM

## 2020-12-12 DIAGNOSIS — O26893 Other specified pregnancy related conditions, third trimester: Secondary | ICD-10-CM

## 2020-12-12 DIAGNOSIS — O471 False labor at or after 37 completed weeks of gestation: Secondary | ICD-10-CM | POA: Diagnosis not present

## 2020-12-12 DIAGNOSIS — Z3483 Encounter for supervision of other normal pregnancy, third trimester: Secondary | ICD-10-CM

## 2020-12-12 LAB — RUPTURE OF MEMBRANE (ROM)PLUS: Rom Plus: NEGATIVE

## 2020-12-12 NOTE — OB Triage Note (Signed)
Pt [redacted]w[redacted]d G3P2 [facility] w/ c/o ctx and possible LOF. States ctx have been on going and got worse at around 6pm today. Reports no bleeding and positive fetal movement. Monitors applied and assessing. VS WNL.

## 2020-12-12 NOTE — Progress Notes (Signed)
No vb. No lof. Cervical check today  °

## 2020-12-12 NOTE — Progress Notes (Signed)
Routine Prenatal Care Visit  Subjective  Stefanie Sparks is a 25 y.o. G3P2002 at [redacted]w[redacted]d being seen today for ongoing prenatal care.  She is currently monitored for the following issues for this high-risk pregnancy and has Chronic pelvic pain in female; Migraine; Encounter for supervision of other normal pregnancy, third trimester; Tobacco use disorder; Pregnancy; Pelvic pain affecting pregnancy in third trimester, antepartum; COVID-19 affecting pregnancy in third trimester; Pregnancy related bilateral lower abdominal pain, antepartum; Labor and delivery, indication for care; Preterm labor in third trimester; [redacted] weeks gestation of pregnancy; and Vaginal spotting on their problem list.  ----------------------------------------------------------------------------------- Patient reports no bleeding, no cramping, no leaking and occasional contractions.   Contractions: Irregular. Vag. Bleeding: None.  Movement: Present. Leaking Fluid denies.  ----------------------------------------------------------------------------------- The following portions of the patient's history were reviewed and updated as appropriate: allergies, current medications, past family history, past medical history, past social history, past surgical history and problem list. Problem list updated.  Objective  Blood pressure 118/74, weight 151 lb (68.5 kg), last menstrual period 03/19/2020. Pregravid weight 116 lb (52.6 kg) Total Weight Gain 35 lb (15.9 kg) Urinalysis: Urine Protein    Urine Glucose    Fetal Status:     Movement: Present     General:  Alert, oriented and cooperative. Patient is in no acute distress.  Skin: Skin is warm and dry. No rash noted.   Cardiovascular: Normal heart rate noted  Respiratory: Normal respiratory effort, no problems with respiration noted  Abdomen: Soft, gravid, appropriate for gestational age. Pain/Pressure: Present     Pelvic:  Cervical exam performed        Extremities: Normal range of  motion.     Mental Status: Normal mood and affect. Normal behavior. Normal judgment and thought content.   Assessment   25 y.o. G3P2002 at [redacted]w[redacted]d by  12/24/2020, Date entered prior to episode creation presenting for routine prenatal visit  Plan   pregnancy 3 Problems (from 05/04/20 to present)    Problem Noted Resolved   Vaginal spotting 12/04/2020 by Stefanie Sparks, CNM No   Preterm labor in third trimester 12/02/2020 by Stefanie Novak, MD No   Pelvic pain affecting pregnancy in third trimester, antepartum 10/19/2020 by Stefanie Sparks, CNM No   Encounter for supervision of other normal pregnancy, third trimester 05/07/2020 by Stefanie Sparks, CNM No   Overview Addendum 12/04/2020  2:06 PM by Stefanie Sparks, CNM    Clinic [facility] Prenatal Labs  Dating LMP Blood type: O/Positive/-- (07/15 1430)   Genetic Screen NIPS:nml XX Antibody:Negative (07/15 1430)  Anatomic [place] complete Rubella: 4.74 (07/15 1430) Varicella:Equivocal  GTT Third trimester: 95 RPR: Non Reactive (07/15 1430)   Rhogam n/a HBsAg: Negative (07/15 1430)   TDaP vaccine            11/28/20      Flu Shot: HIV: Non Reactive (07/15 1430)   Baby Food   breast                             GBS: negative  Contraception  POPs Pap: 08/25/2019, NILM at [facility]  CBB  no   CS/VBAC n/a   Support Person     COVID + 10/23/2020.      Previous Version       Term labor symptoms and general obstetric precautions including but not limited to vaginal bleeding, contractions, leaking of fluid and fetal movement were reviewed in detail with the patient. Please  refer to After Visit Summary for other counseling recommendations.  Labor precautions! Return in about 1 week (around 12/19/2020) for return OB, discuss  IOL?Stefanie Sparks, CNM  12/12/2020 11:19 AM

## 2020-12-13 DIAGNOSIS — O471 False labor at or after 37 completed weeks of gestation: Secondary | ICD-10-CM | POA: Diagnosis not present

## 2020-12-15 ENCOUNTER — Other Ambulatory Visit: Payer: Self-pay

## 2020-12-15 ENCOUNTER — Other Ambulatory Visit: Payer: Self-pay | Admitting: Obstetrics & Gynecology

## 2020-12-15 ENCOUNTER — Observation Stay (HOSPITAL_BASED_OUTPATIENT_CLINIC_OR_DEPARTMENT_OTHER)
Admission: EM | Admit: 2020-12-15 | Discharge: 2020-12-15 | Disposition: A | Discharge status: Home / Self Care | Payer: Medicaid Other | Source: Home / Self Care | Admitting: Obstetrics & Gynecology

## 2020-12-15 ENCOUNTER — Encounter: Payer: Self-pay | Admitting: Obstetrics & Gynecology

## 2020-12-15 DIAGNOSIS — O26893 Other specified pregnancy related conditions, third trimester: Secondary | ICD-10-CM | POA: Insufficient documentation

## 2020-12-15 DIAGNOSIS — O26899 Other specified pregnancy related conditions, unspecified trimester: Secondary | ICD-10-CM | POA: Diagnosis present

## 2020-12-15 DIAGNOSIS — Z3A38 38 weeks gestation of pregnancy: Secondary | ICD-10-CM

## 2020-12-15 DIAGNOSIS — F17200 Nicotine dependence, unspecified, uncomplicated: Secondary | ICD-10-CM | POA: Insufficient documentation

## 2020-12-15 DIAGNOSIS — Z8616 Personal history of COVID-19: Secondary | ICD-10-CM | POA: Insufficient documentation

## 2020-12-15 DIAGNOSIS — R1030 Lower abdominal pain, unspecified: Secondary | ICD-10-CM | POA: Diagnosis present

## 2020-12-15 DIAGNOSIS — O99333 Smoking (tobacco) complicating pregnancy, third trimester: Secondary | ICD-10-CM | POA: Insufficient documentation

## 2020-12-15 MED ORDER — ACETAMINOPHEN 325 MG PO TABS
650.0000 mg | ORAL_TABLET | ORAL | Status: DC | PRN
  Administered 2020-12-15: 650 mg via ORAL

## 2020-12-15 MED ORDER — ACETAMINOPHEN 325 MG PO TABS
ORAL_TABLET | ORAL | Status: AC
  Filled 2020-12-15: qty 2

## 2020-12-15 MED ORDER — ACETAMINOPHEN 325 MG PO TABS: 650.0000 mg | ORAL_TABLET | ORAL | Status: DC | PRN

## 2020-12-15 MED ORDER — LIDOCAINE HCL (PF) 1 % IJ SOLN: 30.0000 mL | INTRAMUSCULAR | Status: DC | PRN

## 2020-12-15 MED ORDER — ONDANSETRON HCL 4 MG/2ML IJ SOLN: 4.0000 mg | Freq: Four times a day (QID) | INTRAMUSCULAR | Status: DC | PRN

## 2020-12-15 NOTE — Discharge Summary (Signed)
  See FPN 

## 2020-12-15 NOTE — Final Progress Note (Signed)
Physician Final Progress Note  Patient ID: Stefanie Sparks MRN: [id number] DOB/AGE: Mar 16, 1996 25 y.o.  Admit date: 12/15/2020 Admitting provider: Nadara Mustard, MD Discharge date: 12/15/2020  Admission Diagnoses: 38 weeks   Pregnancy related bilateral lower abdominal pain, antepartum  Discharge Diagnoses:  Principal Problem:   Pregnancy related bilateral lower abdominal pain, antepartum  Consults: None  Significant Findings/ Diagnostic Studies:  Obstetrics Admission History & Physical   No chief complaint on file.   HPI:  25 y.o. [id number] @ [redacted]w[redacted]d (12/24/2020, Date entered prior to episode creation). Admitted on 12/15/2020:   Patient Active Problem List   Diagnosis Date Noted  . Vaginal spotting 12/04/2020  . Preterm labor in third trimester 12/02/2020  . [redacted] weeks gestation of pregnancy 12/02/2020  . Labor and delivery, indication for care 11/24/2020  . Pregnancy related bilateral lower abdominal pain, antepartum 11/09/2020  . COVID-19 affecting pregnancy in third trimester   . Pregnancy 10/19/2020  . Pelvic pain affecting pregnancy in third trimester, antepartum 10/19/2020  . Encounter for supervision of other normal pregnancy, third trimester 05/07/2020  . Tobacco use disorder 05/07/2020  . Chronic pelvic pain in female 10/02/2018  . Migraine 06/17/2011     Presents for lower abdominal pains, knows she has been 5 cm for the last few weeks and is concerned about rapid delivery.  No VB or ROM.   [facility] at: at [facility]. Pregnancy complicated by none.  ROS: A review of systems was performed and negative, except as stated in the above HPI. PMHx:  Past Medical History:  Diagnosis Date  . Endometriosis   . Gastroenteritis 04/06/2016  . Migraine    PSHx:  Past Surgical History:  Procedure Laterality Date  . WISDOM TOOTH EXTRACTION     Medications:  Medications Prior to Admission  Medication Sig Dispense Refill Last Dose  . lansoprazole (PREVACID) 30 MG  capsule Take 1 capsule (30 mg total) by mouth daily at 12 noon. 30 capsule 0 12/14/2020 at Unknown time  . Prenatal Vit-Fe Fumarate-FA (MULTIVITAMIN-PRENATAL) 27-0.8 MG TABS tablet Take 1 tablet by mouth daily at 12 noon.   12/14/2020 at Unknown time   Allergies: is allergic to other. OBHx:  OB History  Gravida Para Term Preterm AB Living  3 2 2  0 0 2  SAB IAB Ectopic Multiple Live Births  0 0 0 0 2    # Outcome Date GA Lbr Len/2nd Weight Sex Delivery Anes PTL Lv  3 Current           2 Term 04/24/16 [redacted]w[redacted]d / 00:53 3220 g M Vag-Spont EPI  LIV  1 Term 11/03/10 [redacted]w[redacted]d  2977 g F Vag-Spont None  LIV   [redacted]w[redacted]d except as detailed in HPI.YVO:PFYTWKMQ/[id number]  No family history of birth defects. Soc Hx: Alcohol: none and Recreational drug use: none  Objective:   Vitals:   12/15/20 0321  BP: 118/70  Pulse: 94  Resp: 18  Temp: 98.3 F (36.8 C)   Constitutional: Well nourished, well developed female in no acute distress.  HEENT: normal Skin: Warm and dry.  Cardiovascular:Regular rate and rhythm.   Extremity: trace to 1+ bilateral pedal edema Respiratory: Clear to auscultation bilateral. Normal respiratory effort Abdomen: gravid, ND, FHT present, mild tenderness on exam Back: no CVAT Neuro: DTRs 2+, Cranial nerves grossly intact Psych: Alert and Oriented x3. No memory deficits. Normal mood and affect.  MS: normal gait, normal bilateral lower extremity ROM/strength/stability.  Pelvic exam: is not limited by body habitus EGBUS: within normal  limits Vagina: within normal limits and with normal mucosa Cervix: 5/65/-3, VTX Uterus: Uterus demonstrates irritability pattern.  Adnexa: not evaluated  EFM:FHR: 140 bpm, variability: moderate,  accelerations:  Present,  decelerations:  Absent Toco: rare ctx   Perinatal info:  Blood type: O positive Rubella- Immune Varicella -Not immune TDaP Given during third trimester of this pregnancy RPR NR / HIV Neg/ HBsAg Neg   Assessment & Plan:    25 y.o. [id number] @ [redacted]w[redacted]d, Admitted on 12/15/2020:Principal Problem:   Pregnancy related bilateral lower abdominal pain, antepartum    Discharge Home, Observe for cervical change and Fetal Wellbeing Reassuring  - No cervical change over 2+ hours of monitoring  - IOL scheduled for 39 weeks  Procedures: A NST procedure was performed with FHR monitoring and a normal baseline established, appropriate time of 20-40 minutes of evaluation, and accels >2 seen w 15x15 characteristics.  Results show a REACTIVE NST.   Discharge Condition: good  Disposition: Discharge disposition: 01-Home or Self Care       Diet: Regular diet  Discharge Activity: Activity as tolerated  Discharge Instructions    Call MD for:   Complete by: As directed    Worsening contractions or pain; leakage of fluid; bleeding.   Diet - low sodium heart healthy   Complete by: As directed    Increase activity slowly   Complete by: As directed      Allergies as of 12/15/2020      Reactions   Other Other (See Comments)   oranges Uncoded Allergy. Allergen: Shellfish, Other Reaction: Other reaction      Medication List    TAKE these medications   lansoprazole 30 MG capsule Commonly known as: Prevacid Take 1 capsule (30 mg total) by mouth daily at 12 noon.   multivitamin-prenatal 27-0.8 MG Tabs tablet Take 1 tablet by mouth daily at 12 noon.       Follow-up Information    [facility]. Go in 2 day(s).   Specialty: Obstetrics and Gynecology Why: For INDUCTION of LABOR Contact information: [id number] [place] [address] [phone]              Total time spent taking care of this patient: 20 minutes plus 2 hours of monitoring  Signed: Letitia Libra 12/15/2020, 6:12 AM

## 2020-12-15 NOTE — Discharge Instructions (Signed)
LABOR: When contractions begin, you should start to time them from the beginning of one contraction to the beginning of the next.  When contractions are 5-10 minutes apart or less and have been regular for at least an hour, you should call your health care provider.  Notify your doctor if any of the following occur: 1. Bleeding from the vagina 7. Sudden, constant, or occasional abdominal pain  2. Pain or burning when urinating 8. Sudden gushing of fluid from the vagina (with or without continued leaking)  3. Chills or fever 9. Fainting spells, "black outs" or loss of consciousness  4. Increase in vaginal discharge 10. Severe or continued nausea or vomiting  5. Pelvic pressure (sudden increase) 11. Blurring of vision or spots before the eyes  6. Baby moving less than usual 12. Leaking of fluid    FETAL KICK COUNT: Lie on your left side for one hour after a meal, and count the number of times your baby kicks. If it is less than 5 times, get up, move around and drink some juice. Repeat the test 30 minutes later. If it is still less than 5 kicks in an hour, notify your doctor.Rosen's Emergency Medicine: Concepts and Clinical Practice (9th ed., pp. 2296- 2312). Elsevier.">  Braxton Hicks Contractions Contractions of the uterus can occur throughout pregnancy, but they are not always a sign that you are in labor. You may have practice contractions called Braxton Hicks contractions. These false labor contractions are sometimes confused with true labor. What are Deberah Pelton contractions? Braxton Hicks contractions are tightening movements that occur in the muscles of the uterus before labor. Unlike true labor contractions, these contractions do not result in opening (dilation) and thinning of the cervix. Toward the end of pregnancy (32-34 weeks), Braxton Hicks contractions can happen more often and may become stronger. These contractions are sometimes difficult to tell apart from true labor because they can  be very uncomfortable. You should not feel embarrassed if you go to the hospital with false labor. Sometimes, the only way to tell if you are in true labor is for your health care provider to look for changes in the cervix. The health care provider will do a physical exam and may monitor your contractions. If you are not in true labor, the exam should show that your cervix is not dilating and your water has not broken. If there are no other health problems associated with your pregnancy, it is completely safe for you to be sent home with false labor. You may continue to have Braxton Hicks contractions until you go into true labor. How to tell the difference between true labor and false labor True labor  Contractions last 30-70 seconds.  Contractions become very regular.  Discomfort is usually felt in the top of the uterus, and it spreads to the lower abdomen and low back.  Contractions do not go away with walking.  Contractions usually become more intense and increase in frequency.  The cervix dilates and gets thinner. False labor  Contractions are usually shorter and not as strong as true labor contractions.  Contractions are usually irregular.  Contractions are often felt in the front of the lower abdomen and in the groin.  Contractions may go away when you walk around or change positions while lying down.  Contractions get weaker and are shorter-lasting as time goes on.  The cervix usually does not dilate or become thin. Follow these instructions at home:  Take over-the-counter and prescription medicines only as  told by your health care provider.  Keep up with your usual exercises and follow other instructions from your health care provider.  Eat and drink lightly if you think you are going into labor.  If Braxton Hicks contractions are making you uncomfortable: ? Change your position from lying down or resting to walking, or change from walking to resting. ? Sit and rest  in a tub of warm water. ? Drink enough fluid to keep your urine pale yellow. Dehydration may cause these contractions. ? Do slow and deep breathing several times an hour.  Keep all follow-up prenatal visits as told by your health care provider. This is important.   Contact a health care provider if:  You have a fever.  You have continuous pain in your abdomen. Get help right away if:  Your contractions become stronger, more regular, and closer together.  You have fluid leaking or gushing from your vagina.  You pass blood-tinged mucus (bloody show).  You have bleeding from your vagina.  You have low back pain that you never had before.  You feel your baby's head pushing down and causing pelvic pressure.  Your baby is not moving inside you as much as it used to. Summary  Contractions that occur before labor are called Braxton Hicks contractions, false labor, or practice contractions.  Braxton Hicks contractions are usually shorter, weaker, farther apart, and less regular than true labor contractions. True labor contractions usually become progressively stronger and regular, and they become more frequent.  Manage discomfort from [facility] contractions by changing position, resting in a warm bath, drinking plenty of water, or practicing deep breathing. This information is not intended to replace advice given to you by your health care provider. Make sure you discuss any questions you have with your health care provider. Document Revised: 09/19/2017 Document Reviewed: 02/20/2017 Elsevier Patient Education  2021 [employer].

## 2020-12-15 NOTE — OB Triage Note (Signed)
[facility] to [facility] with complaints of contractions that began around 0100. Pt states ctx are about 6 mins a part and rates 8/10. Pt complains of headache and nausea. Pt denies LOF and vaginal bleeding. Pt states positive FM. Monitors applied and assessing. Initial FHT 145.

## 2020-12-15 NOTE — Progress Notes (Signed)
  [facility]IONAL BIRTHPLACE INDUCTION ASSESSMENT SCHEDULING Stefanie Sparks 01-Jul-1996 Medical record #: [id number] Phone #:  Home Phone [phone]  Mobile [phone]    Prenatal Provider:[facility]ivering [facility] Proposed admission date/time:12/17/20 0800 Method of induction:Pitocin  Weight: There were no vitals filed for this visit. BMI There is no height or weight on file to calculate BMI. HIV Negative HSV Negative EDC Estimated Date of Delivery: 3/6/22based on:LMP  Gestational age on admission: 39 weeks Gravidity/parity:G3P2002  Cervix Score   0 1 2 3   Position  Midposition    Consistency  Medium    Effacement (%)   60-70   Dilation (cm)    >5  Baby's station -3      Bishop Score:7   Medical induction of labor  select indication(s) below Elective induction ?39 weeks multiparous patient ?39 weeks primiparous patient with Bishop score ?7 ?40 weeks primiparous patient    Provider Signature: Scheduled Letitia Libra Date:12/15/2020 6:23 AM   Call [phone] to finalize the induction date/time  [phone] (07/17)

## 2020-12-15 NOTE — OB Triage Note (Signed)
Pt discharged home in stable condition. Elective IOL scheduled for 12/17/2020 at 0800. RN provided information about IOL to pt. RN also provided discharge instructions to pt, including when to come back. Pt verbalized understanding and all questions answered at this time.

## 2020-12-15 NOTE — Progress Notes (Signed)
History and Physical  Stefanie Sparks is a 25 y.o. O2V0350 [redacted]w[redacted]d  for Induction of Labor scheduled due to Favorable cervix at term .   She has been 5 cm for a few weeks and she is concerned for precipitous delivery.  See labor record for pregnancy highlights.  No recent pain, bleeding, ruptured membranes, or other signs of progressing labor.  PMHx: She  has a past medical history of Endometriosis, Gastroenteritis (04/06/2016), and Migraine. Also,  has a past surgical history that includes Wisdom tooth extraction., family history includes Asthma in her brother and son; Bipolar disorder in her brother and father; Breast cancer in her maternal great-grandmother; Diabetes in her maternal grandmother; Endometriosis in her maternal grandmother and mother; Seizures in her son.,  reports that she has been smoking cigarettes. She has a 2.25 pack-year smoking history. She has never used smokeless tobacco. She reports previous alcohol use. She reports previous drug use. Drug: Marijuana. She has a current medication list which includes the following prescription(s): lansoprazole and multivitamin-prenatal, and the following Facility-Administered Medications: acetaminophen, lidocaine (pf), and ondansetron. Also, is allergic to other. OB History  Gravida Para Term Preterm AB Living  3 2 2  0 0 2  SAB IAB Ectopic Multiple Live Births  0 0 0 0 2    # Outcome Date GA Lbr Len/2nd Weight Sex Delivery Anes PTL Lv  3 Current           2 Term 04/24/16 [redacted]w[redacted]d / 00:53 7 lb 1.6 oz (3.22 kg) M Vag-Spont EPI  LIV  1 Term 11/03/10 [redacted]w[redacted]d  6 lb 9 oz (2.977 kg) F Vag-Spont None  LIV  Patient denies any other pertinent gynecologic issues.   Review of Systems  Constitutional: Negative for chills, fever and malaise/fatigue.  HENT: Negative for congestion, sinus pain and sore throat.   Eyes: Negative for blurred vision and pain.  Respiratory: Negative for cough and wheezing.   Cardiovascular: Negative for chest pain and leg  swelling.  Gastrointestinal: Negative for abdominal pain, constipation, diarrhea, heartburn, nausea and vomiting.  Genitourinary: Negative for dysuria, frequency, hematuria and urgency.  Musculoskeletal: Negative for back pain, joint pain, myalgias and neck pain.  Skin: Negative for itching and rash.  Neurological: Negative for dizziness, tremors and weakness.  Endo/Heme/Allergies: Does not bruise/bleed easily.  Psychiatric/Behavioral: Negative for depression. The patient is not nervous/anxious and does not have insomnia.     Objective: LMP 03/19/2020 (Exact Date)  Physical Exam Constitutional:      General: She is not in acute distress.    Appearance: She is well-developed and well-nourished.  Genitourinary:     Vagina, uterus and rectum normal.     There is no rash or lesion on the right labia.     There is no rash or lesion on the left labia.    No lesions in the vagina.     No vaginal bleeding.      Right Adnexa: not tender and no mass present.    Left Adnexa: not tender and no mass present.    No cervical motion tenderness, friability, lesion or polyp.     Cervical exam comments: 5/65/-3, Vtx.     Uterus is mobile.     Uterus is not enlarged.     No uterine mass detected.    Uterus is midaxial.     Pelvic exam was performed with patient supine.  Breasts:     Right: No mass, skin change or tenderness.     Left: No  mass, skin change or tenderness.    HENT:     Head: Normocephalic and atraumatic. No laceration.     Right Ear: Hearing normal.     Left Ear: Hearing normal.     Nose: No epistaxis or foreign body.     Mouth/Throat:     Mouth: Oropharynx is clear and moist and mucous membranes are normal.     Pharynx: Uvula midline.  Eyes:     Pupils: Pupils are equal, round, and reactive to light.  Neck:     Thyroid: No thyromegaly.  Cardiovascular:     Rate and Rhythm: Normal rate and regular rhythm.     Heart sounds: No murmur heard. No friction rub. No gallop.    Pulmonary:     Effort: Pulmonary effort is normal. No respiratory distress.     Breath sounds: Normal breath sounds. No wheezing.  Abdominal:     General: Bowel sounds are normal. There is no distension.     Palpations: Abdomen is soft.     Tenderness: There is no abdominal tenderness. There is no rebound.  Musculoskeletal:        General: Normal range of motion.     Cervical back: Normal range of motion and neck supple.  Neurological:     Mental Status: She is alert and oriented to person, place, and time.     Cranial Nerves: No cranial nerve deficit.  Skin:    General: Skin is warm and dry.  Psychiatric:        Mood and Affect: Mood and affect normal.        Judgment: Judgment normal.  Vitals reviewed.     Assessment: Term Pregnancy for Induction of Labor due to Favorable cervix at term.  Plan: Patient will undergo induction of labor with pitocin, amniotomy.     Patient has been fully informed of the pros and cons, risks and benefits of continued observation with fetal monitoring versus that of induction of labor.   She understands that there are uncommon risks to induction, which include but are not limited to : frequent or prolonged uterine contractions, fetal distress, uterine rupture, and lack of successful induction.  These risks include all methods including Pitocin and Misoprostol and Cervadil.  Patient understands that using Misoprostol for labor induction is an "off label" indication although it has been studied extensively for this purpose and is an accepted method of induction.  She also has been informed of the increased risks for Cesarean with induction and should induction not be successful.  Patient consents to the induction plan of management.  Plans to breast and bottle feed  Annamarie Major, MD, [facility], [facility] 12/15/2020  6:21 AM

## 2020-12-16 ENCOUNTER — Encounter: Payer: Self-pay | Admitting: Obstetrics & Gynecology

## 2020-12-16 ENCOUNTER — Inpatient Hospital Stay: Payer: Medicaid Other | Admitting: Anesthesiology

## 2020-12-16 ENCOUNTER — Inpatient Hospital Stay
Admission: EM | Admit: 2020-12-16 | Discharge: 2020-12-17 | DRG: 807 | Disposition: A | Discharge status: Home / Self Care | Payer: Medicaid Other | Attending: Obstetrics and Gynecology | Admitting: Obstetrics and Gynecology

## 2020-12-16 ENCOUNTER — Other Ambulatory Visit: Payer: Self-pay

## 2020-12-16 DIAGNOSIS — O99334 Smoking (tobacco) complicating childbirth: Secondary | ICD-10-CM | POA: Diagnosis present

## 2020-12-16 DIAGNOSIS — F17210 Nicotine dependence, cigarettes, uncomplicated: Secondary | ICD-10-CM | POA: Diagnosis present

## 2020-12-16 DIAGNOSIS — Z8616 Personal history of COVID-19: Secondary | ICD-10-CM

## 2020-12-16 DIAGNOSIS — Z3A38 38 weeks gestation of pregnancy: Secondary | ICD-10-CM

## 2020-12-16 DIAGNOSIS — O4292 Full-term premature rupture of membranes, unspecified as to length of time between rupture and onset of labor: Principal | ICD-10-CM | POA: Diagnosis present

## 2020-12-16 DIAGNOSIS — R102 Pelvic and perineal pain: Secondary | ICD-10-CM

## 2020-12-16 DIAGNOSIS — O26893 Other specified pregnancy related conditions, third trimester: Secondary | ICD-10-CM

## 2020-12-16 DIAGNOSIS — Z3483 Encounter for supervision of other normal pregnancy, third trimester: Secondary | ICD-10-CM

## 2020-12-16 DIAGNOSIS — N939 Abnormal uterine and vaginal bleeding, unspecified: Secondary | ICD-10-CM

## 2020-12-16 LAB — CBC
HCT: 29.3 % — ABNORMAL LOW (ref 36.0–46.0)
Hemoglobin: 9.6 g/dL — ABNORMAL LOW (ref 12.0–15.0)
MCH: 26.6 pg (ref 26.0–34.0)
MCHC: 32.8 g/dL (ref 30.0–36.0)
MCV: 81.2 fL (ref 80.0–100.0)
Platelets: 334 10*3/uL (ref 150–400)
RBC: 3.61 MIL/uL — ABNORMAL LOW (ref 3.87–5.11)
RDW: 13.6 % (ref 11.5–15.5)
WBC: 14.5 10*3/uL — ABNORMAL HIGH (ref 4.0–10.5)
nRBC: 0.4 % — ABNORMAL HIGH (ref 0.0–0.2)

## 2020-12-16 LAB — TYPE AND SCREEN
ABO/RH(D): O POS
Antibody Screen: NEGATIVE

## 2020-12-16 LAB — RUPTURE OF MEMBRANE (ROM)PLUS: Rom Plus: POSITIVE

## 2020-12-16 MED ORDER — IBUPROFEN 600 MG PO TABS
600.0000 mg | ORAL_TABLET | Freq: Four times a day (QID) | ORAL | Status: DC
  Administered 2020-12-16 (×2): 600 mg via ORAL
  Filled 2020-12-16 (×4): qty 1

## 2020-12-16 MED ORDER — ONDANSETRON HCL 4 MG/2ML IJ SOLN: 4.0000 mg | INTRAMUSCULAR | Status: DC | PRN

## 2020-12-16 MED ORDER — LIDOCAINE HCL (PF) 1 % IJ SOLN
30.0000 mL | INTRAMUSCULAR | Status: DC | PRN
  Filled 2020-12-16: qty 30

## 2020-12-16 MED ORDER — BENZOCAINE-MENTHOL 20-0.5 % EX AERO: 1.0000 "application " | INHALATION_SPRAY | CUTANEOUS | Status: DC | PRN

## 2020-12-16 MED ORDER — OXYTOCIN 10 UNIT/ML IJ SOLN
INTRAMUSCULAR | Status: AC
  Filled 2020-12-16: qty 2

## 2020-12-16 MED ORDER — OXYTOCIN BOLUS FROM INFUSION
333.0000 mL | Freq: Once | INTRAVENOUS | Status: AC
  Administered 2020-12-16: 333 mL via INTRAVENOUS

## 2020-12-16 MED ORDER — ONDANSETRON HCL 4 MG PO TABS: 4.0000 mg | ORAL_TABLET | ORAL | Status: DC | PRN

## 2020-12-16 MED ORDER — OXYCODONE-ACETAMINOPHEN 5-325 MG PO TABS: 2.0000 | ORAL_TABLET | ORAL | Status: DC | PRN

## 2020-12-16 MED ORDER — MISOPROSTOL 200 MCG PO TABS
ORAL_TABLET | ORAL | Status: AC
  Filled 2020-12-16: qty 4

## 2020-12-16 MED ORDER — SENNOSIDES-DOCUSATE SODIUM 8.6-50 MG PO TABS
2.0000 | ORAL_TABLET | ORAL | Status: DC
  Filled 2020-12-16: qty 2

## 2020-12-16 MED ORDER — FENTANYL 2.5 MCG/ML W/ROPIVACAINE 0.15% IN NS 100 ML EPIDURAL (ARMC)
EPIDURAL | Status: AC
  Filled 2020-12-16: qty 100

## 2020-12-16 MED ORDER — DIPHENHYDRAMINE HCL 50 MG/ML IJ SOLN: 12.5000 mg | INTRAMUSCULAR | Status: DC | PRN

## 2020-12-16 MED ORDER — FENTANYL 2.5 MCG/ML W/ROPIVACAINE 0.15% IN NS 100 ML EPIDURAL (ARMC)
12.0000 mL/h | EPIDURAL | Status: DC
  Administered 2020-12-16: 12 mL/h via EPIDURAL

## 2020-12-16 MED ORDER — OXYTOCIN-SODIUM CHLORIDE 30-0.9 UT/500ML-% IV SOLN
2.5000 [IU]/h | INTRAVENOUS | Status: DC
  Filled 2020-12-16: qty 500

## 2020-12-16 MED ORDER — EPHEDRINE 5 MG/ML INJ
10.0000 mg | INTRAVENOUS | Status: DC | PRN
  Filled 2020-12-16: qty 2

## 2020-12-16 MED ORDER — BUTORPHANOL TARTRATE 1 MG/ML IJ SOLN: 1.0000 mg | INTRAMUSCULAR | Status: DC | PRN

## 2020-12-16 MED ORDER — TERBUTALINE SULFATE 1 MG/ML IJ SOLN: 0.2500 mg | Freq: Once | INTRAMUSCULAR | Status: DC | PRN

## 2020-12-16 MED ORDER — DIBUCAINE (PERIANAL) 1 % EX OINT: 1.0000 "application " | TOPICAL_OINTMENT | CUTANEOUS | Status: DC | PRN

## 2020-12-16 MED ORDER — LACTATED RINGERS IV SOLN: INTRAVENOUS | Status: DC

## 2020-12-16 MED ORDER — ONDANSETRON HCL 4 MG/2ML IJ SOLN: 4.0000 mg | Freq: Four times a day (QID) | INTRAMUSCULAR | Status: DC | PRN

## 2020-12-16 MED ORDER — LIDOCAINE-EPINEPHRINE (PF) 1.5 %-1:200000 IJ SOLN
INTRAMUSCULAR | Status: DC | PRN
  Administered 2020-12-16: 3 mL via EPIDURAL

## 2020-12-16 MED ORDER — AMMONIA AROMATIC IN INHA
RESPIRATORY_TRACT | Status: AC
  Filled 2020-12-16: qty 10

## 2020-12-16 MED ORDER — PHENYLEPHRINE 40 MCG/ML (10ML) SYRINGE FOR IV PUSH (FOR BLOOD PRESSURE SUPPORT)
80.0000 ug | PREFILLED_SYRINGE | INTRAVENOUS | Status: DC | PRN
  Filled 2020-12-16: qty 10

## 2020-12-16 MED ORDER — SODIUM CHLORIDE 0.9 % IV SOLN
INTRAVENOUS | Status: DC | PRN
  Administered 2020-12-16 (×2): 5 mL via EPIDURAL

## 2020-12-16 MED ORDER — LIDOCAINE HCL (PF) 1 % IJ SOLN
INTRAMUSCULAR | Status: DC | PRN
  Administered 2020-12-16: 3 mL via SUBCUTANEOUS

## 2020-12-16 MED ORDER — DIPHENHYDRAMINE HCL 25 MG PO CAPS
25.0000 mg | ORAL_CAPSULE | Freq: Four times a day (QID) | ORAL | Status: DC | PRN
Start: 2020-12-16 — End: 2020-12-17

## 2020-12-16 MED ORDER — WITCH HAZEL-GLYCERIN EX PADS
1.0000 "application " | MEDICATED_PAD | CUTANEOUS | Status: DC | PRN
  Administered 2020-12-16: 1 via TOPICAL
  Filled 2020-12-16: qty 100

## 2020-12-16 MED ORDER — LACTATED RINGERS IV SOLN
500.0000 mL | Freq: Once | INTRAVENOUS | Status: AC
  Administered 2020-12-16: 500 mL via INTRAVENOUS

## 2020-12-16 MED ORDER — ACETAMINOPHEN 325 MG PO TABS: 650.0000 mg | ORAL_TABLET | ORAL | Status: DC | PRN

## 2020-12-16 MED ORDER — LACTATED RINGERS IV SOLN: 500.0000 mL | INTRAVENOUS | Status: DC | PRN

## 2020-12-16 MED ORDER — VARICELLA VIRUS VACCINE LIVE 1350 PFU/0.5ML IJ SUSR
0.5000 mL | Freq: Once | INTRAMUSCULAR | Status: DC
  Filled 2020-12-16 (×2): qty 0.5

## 2020-12-16 MED ORDER — PRENATAL MULTIVITAMIN CH: 1.0000 | ORAL_TABLET | Freq: Every day | ORAL | Status: DC

## 2020-12-16 MED ORDER — COCONUT OIL OIL
1.0000 "application " | TOPICAL_OIL | Status: DC | PRN
  Administered 2020-12-16: 1 via TOPICAL
  Filled 2020-12-16: qty 120

## 2020-12-16 MED ORDER — ACETAMINOPHEN 325 MG PO TABS
650.0000 mg | ORAL_TABLET | ORAL | Status: DC | PRN
  Administered 2020-12-16 (×2): 650 mg via ORAL
  Filled 2020-12-16 (×3): qty 2

## 2020-12-16 MED ORDER — OXYCODONE-ACETAMINOPHEN 5-325 MG PO TABS: 1.0000 | ORAL_TABLET | ORAL | Status: DC | PRN

## 2020-12-16 MED ORDER — BENZOCAINE-MENTHOL 20-0.5 % EX AERO
INHALATION_SPRAY | CUTANEOUS | Status: AC
  Filled 2020-12-16: qty 56

## 2020-12-16 MED ORDER — IBUPROFEN 600 MG PO TABS
ORAL_TABLET | ORAL | Status: AC
  Administered 2020-12-16: 600 mg via ORAL
  Filled 2020-12-16: qty 1

## 2020-12-16 MED ORDER — SIMETHICONE 80 MG PO CHEW: 80.0000 mg | CHEWABLE_TABLET | ORAL | Status: DC | PRN

## 2020-12-16 MED ORDER — OXYTOCIN-SODIUM CHLORIDE 30-0.9 UT/500ML-% IV SOLN
1.0000 m[IU]/min | INTRAVENOUS | Status: DC
  Administered 2020-12-16: 1 m[IU]/min via INTRAVENOUS
  Filled 2020-12-16: qty 500

## 2020-12-16 NOTE — H&P (Signed)
OB History & Physical   History of Present Illness:  Chief Complaint: leaking water  HPI:  Stefanie Sparks is a 25 y.o. G29P2002 female at [redacted]w[redacted]d dated by LMP consistent with a 7 week u/s.  Her pregnancy has been complicated by COVID19 infection in early January.    She reports reports contractions.   She reports leakage of fluid since about 1230 AM this morning.   She denies vaginal bleeding.   She reports fetal movement.    Total weight gain for pregnancy: 15.9 kg   Obstetrical Problem List: pregnancy 3 Problems (from 05/04/20 to present)    Problem Noted Resolved   Vaginal spotting 12/04/2020 by [name], CNM No   Preterm labor in third trimester 12/02/2020 by [name], MD No   Pelvic pain affecting pregnancy in third trimester, antepartum 10/19/2020 by [name], CNM No   Encounter for supervision of other normal pregnancy, third trimester 05/07/2020 by [name], CNM No   Overview Addendum 12/04/2020  2:06 PM by [name], CNM    Clinic [facility] Prenatal Labs  Dating LMP Blood type: O/Positive/-- (07/15 1430)   Genetic Screen NIPS:nml XX Antibody:Negative (07/15 1430)  Anatomic [place] complete Rubella: 4.74 (07/15 1430) Varicella:Equivocal  GTT Third trimester: 95 RPR: Non Reactive (07/15 1430)   Rhogam n/a HBsAg: Negative (07/15 1430)   TDaP vaccine            11/28/20      Flu Shot: HIV: Non Reactive (07/15 1430)   Baby Food   breast                             GBS: negative  Contraception  POPs Pap: 08/25/2019, NILM at [facility]  CBB  no   CS/VBAC n/a   Support Person     COVID + 10/23/2020.      Previous Version       Maternal Medical History:   Past Medical History:  Diagnosis Date  . Endometriosis   . Gastroenteritis 04/06/2016  . Migraine     Past Surgical History:  Procedure Laterality Date  . WISDOM TOOTH EXTRACTION      Allergies  Allergen Reactions  . Other Other (See Comments)    oranges Uncoded Allergy. Allergen: Shellfish,  Other Reaction: Other reaction    Prior to Admission medications   Medication Sig Start Date End Date Taking? Authorizing Provider  lansoprazole (PREVACID) 30 MG capsule Take 1 capsule (30 mg total) by mouth daily at 12 noon. 11/20/20   [name], CNM  Prenatal Vit-Fe Fumarate-FA (MULTIVITAMIN-PRENATAL) 27-0.8 MG TABS tablet Take 1 tablet by mouth daily at 12 noon.    [provider]    OB History  Gravida Para Term Preterm AB Living  3 2 2  0 0 2  SAB IAB Ectopic Multiple Live Births  0 0 0 0 2    # Outcome Date GA Lbr Len/2nd Weight Sex Delivery Anes PTL Lv  3 Current           2 Term 04/24/16 [redacted]w[redacted]d / 00:53 3220 g M Vag-Spont EPI  LIV  1 Term 11/03/10 [redacted]w[redacted]d  2977 g F Vag-Spont None  LIV    Prenatal care site: [facility]  Social History: She  reports that she has been smoking cigarettes. She has a 2.25 pack-year smoking history. She has never used smokeless tobacco. She reports previous alcohol use. She reports previous drug use. Drug:  Marijuana.  Family History: family history includes Asthma in her brother and son; Bipolar disorder in her brother and father; Breast cancer in her maternal great-grandmother; Diabetes in her maternal grandmother; Endometriosis in her maternal grandmother and mother; Seizures in her son.   Review of Systems:  Review of Systems  Constitutional: Negative.   HENT: Negative.   Eyes: Negative.   Respiratory: Negative.   Cardiovascular: Negative.   Gastrointestinal: Negative.   Genitourinary: Negative.   Musculoskeletal: Negative.   Skin: Negative.   Neurological: Negative.   Psychiatric/Behavioral: Negative.      Physical Exam:  BP 124/75   Pulse 94   Temp 98.2 F (36.8 C) (Oral)   Resp 17   Ht 4\' 11"  (1.499 m)   Wt 68.5 kg   LMP 03/19/2020 (Exact Date)   BMI 30.50 kg/m   Physical Exam Constitutional:      General: She is not in acute distress.    Appearance: Normal appearance. She is well-developed.   Genitourinary:     Genitourinary Comments: 5 cm per RN  HENT:     Head: Normocephalic and atraumatic.  Eyes:     General: No scleral icterus.    Conjunctiva/sclera: Conjunctivae normal.  Cardiovascular:     Rate and Rhythm: Normal rate and regular rhythm.     Heart sounds: No murmur heard. No friction rub. No gallop.   Pulmonary:     Effort: Pulmonary effort is normal. No respiratory distress.     Breath sounds: Normal breath sounds. No wheezing or rales.  Abdominal:     General: Bowel sounds are normal. There is no distension.     Palpations: Abdomen is soft. There is mass (gravid,NT).     Tenderness: There is no abdominal tenderness. There is no guarding or rebound.  Musculoskeletal:        General: Normal range of motion.     Cervical back: Normal range of motion and neck supple.  Neurological:     General: No focal deficit present.     Mental Status: She is alert and oriented to person, place, and time.     Cranial Nerves: No cranial nerve deficit.  Skin:    General: Skin is warm and dry.     Findings: No erythema.  Psychiatric:        Mood and Affect: Mood normal.        Behavior: Behavior normal.        Judgment: Judgment normal.     ROM Plus: Positive  Baseline FHR: 135 beats/min   Variability: moderate   Accelerations: present   Decelerations: absent Contractions: present frequency: irregular Overall assessment: cat 1   Lab Results  Component Value Date   SARSCOV2NAA POSITIVE (A) 10/23/2020    Assessment:  Stefanie Sparks is a 25 y.o. G95P2002 female at [redacted]w[redacted]d with PROM.   Plan:  1. Admit to Labor & Delivery  2. CBC, T&S, Clrs, IVF 3. GBS negative less than 3 weeks ago.   4. Fetwal well-being: reassuring 5. Start pitocin to effect delivery   [redacted]w[redacted]d, MD 12/16/2020 7:45 AM

## 2020-12-16 NOTE — OB Triage Note (Signed)
Pt arrives G3P2 with c/o ctx and fluid leaking. Pt is in no visible distress at this time.

## 2020-12-16 NOTE — Anesthesia Preprocedure Evaluation (Signed)
Anesthesia Evaluation  Patient identified by MRN, date of birth, ID band Patient awake    Reviewed: Allergy & Precautions, H&P , NPO status , Patient's Chart, lab work & pertinent test results  History of Anesthesia Complications Negative for: history of anesthetic complications  Airway Mallampati: II  TM Distance: >3 FB Neck ROM: full    Dental  (+) Chipped   Pulmonary neg shortness of breath, Current Smoker,    Pulmonary exam normal        Cardiovascular Exercise Tolerance: Good (-) angina(-) Past MI and (-) DOE negative cardio ROS Normal cardiovascular exam     Neuro/Psych  Headaches,    GI/Hepatic negative GI ROS, neg GERD  ,  Endo/Other    Renal/GU   negative genitourinary   Musculoskeletal   Abdominal   Peds  Hematology negative hematology ROS (+)   Anesthesia Other Findings Past Medical History: No date: Endometriosis 04/06/2016: Gastroenteritis No date: Migraine  Past Surgical History: No date: WISDOM TOOTH EXTRACTION  BMI    Body Mass Index: 30.50 kg/m      Reproductive/Obstetrics (+) Pregnancy                             Anesthesia Physical Anesthesia Plan  ASA: II  Anesthesia Plan: Epidural   Post-op Pain Management:    Induction:   PONV Risk Score and Plan:   Airway Management Planned: Natural Airway  Additional Equipment:   Intra-op Plan:   Post-operative Plan:   Informed Consent: I have reviewed the patients History and Physical, chart, labs and discussed the procedure including the risks, benefits and alternatives for the proposed anesthesia with the patient or authorized representative who has indicated his/her understanding and acceptance.     Dental Advisory Given  Plan Discussed with: Anesthesiologist  Anesthesia Plan Comments: (Patient reports no bleeding problems and no anticoagulant use.   Patient consented for risks of anesthesia  including but not limited to:  - adverse reactions to medications - risk of bleeding, infection and or nerve damage from epidural that could lead to paralysis - risk of headache or failed epidural - nerve damage due to positioning - that if epidural is used for C-section that there is a chance of epidural failure requiring spinal placement or conversion to GA - Damage to heart, brain, lungs, other parts of body or loss of life  Patient voiced understanding.)        Anesthesia Quick Evaluation

## 2020-12-16 NOTE — Anesthesia Procedure Notes (Addendum)
Epidural Patient location during procedure: OB Start time: 12/16/2020 8:43 AM End time: 12/16/2020 8:47 AM  Staffing Anesthesiologist: Stephan Draughn, Cleda Mccreedy, MD Performed: anesthesiologist   Preanesthetic Checklist Completed: patient identified, IV checked, site marked, risks and benefits discussed, surgical consent, monitors and equipment checked, pre-op evaluation and timeout performed  Epidural Patient position: sitting Prep: ChloraPrep Patient monitoring: heart rate, continuous pulse ox and blood pressure Approach: midline Location: L3-L4 Injection technique: LOR saline  Needle:  Needle type: Tuohy  Needle gauge: 17 G Needle length: 9 cm and 9 Needle insertion depth: 5 cm Catheter type: closed end flexible Catheter size: 19 Gauge Catheter at skin depth: 10 cm Test dose: negative and 1.5% lidocaine with Epi 1:200 K  Assessment Sensory level: T10 Events: blood not aspirated, injection not painful, no injection resistance, no paresthesia and negative IV test  Additional Notes 1 attempt Pt. Evaluated and documentation done after procedure finished. Patient identified. Risks/Benefits/Options discussed with patient including but not limited to bleeding, infection, nerve damage, paralysis, failed block, incomplete pain control, headache, blood pressure changes, nausea, vomiting, reactions to medication both or allergic, itching and postpartum back pain. Confirmed with bedside nurse the patient's most recent platelet count. Confirmed with patient that they are not currently taking any anticoagulation, have any bleeding history or any family history of bleeding disorders. Patient expressed understanding and wished to proceed. All questions were answered. Sterile technique was used throughout the entire procedure. Please see nursing notes for vital signs. Test dose was given through epidural catheter and negative prior to continuing to dose epidural or start infusion. Warning signs of  high block given to the patient including shortness of breath, tingling/numbness in hands, complete motor block, or any concerning symptoms with instructions to call for help. Patient was given instructions on fall risk and not to get out of bed. All questions and concerns addressed with instructions to call with any issues or inadequate analgesia.   Patient tolerated the insertion well without immediate complications.Reason for block:procedure for pain

## 2020-12-17 ENCOUNTER — Ambulatory Visit: Payer: Self-pay

## 2020-12-17 LAB — CBC
HCT: 28.8 % — ABNORMAL LOW (ref 36.0–46.0)
Hemoglobin: 9.3 g/dL — ABNORMAL LOW (ref 12.0–15.0)
MCH: 26.1 pg (ref 26.0–34.0)
MCHC: 32.3 g/dL (ref 30.0–36.0)
MCV: 80.7 fL (ref 80.0–100.0)
Platelets: 284 10*3/uL (ref 150–400)
RBC: 3.57 MIL/uL — ABNORMAL LOW (ref 3.87–5.11)
RDW: 13.6 % (ref 11.5–15.5)
WBC: 13.8 10*3/uL — ABNORMAL HIGH (ref 4.0–10.5)
nRBC: 0.2 % (ref 0.0–0.2)

## 2020-12-17 LAB — RPR: RPR Ser Ql: NONREACTIVE

## 2020-12-17 MED ORDER — IBUPROFEN 600 MG PO TABS: 600.0000 mg | ORAL_TABLET | Freq: Four times a day (QID) | ORAL | 0 refills | Status: DC

## 2020-12-17 NOTE — Progress Notes (Signed)
Patient discharged home with infant. Mother of patient present at discharge. Discharge instructions and prescriptions given and reviewed with patient. Patient verbalized understanding.   Call ASAP and schedule your follow-up appointment for a visit with Dr. Bonney Aid in 6-weeks. Also, cancel your appointment scheduled for this week!   Varicella vaccine not sent up from pharmacy prior to discharge. Explained this to patient and to ask about in office.   Escorted out by volunteers.

## 2020-12-17 NOTE — Discharge Summary (Signed)
OB Discharge Summary     Patient Name: Stefanie Sparks DOB: May 25, 1996 MRN: [id number]  Date of admission: 12/16/2020 Delivering MD: Vena Austria   Date of discharge: 12/17/2020  Admitting diagnosis: Normal labor and delivery [O80] Intrauterine pregnancy: [redacted]w[redacted]d     Secondary diagnosis:  Active Problems:   Normal labor and delivery  Additional problems: none     Discharge diagnosis: Term Pregnancy Delivered                                                                                                Post partum procedures:none  Augmentation: N/A  Complications: None  Hospital course:  Onset of Labor With Vaginal Delivery      25 y.o. yo G3P3003 at [redacted]w[redacted]d was admitted in Active Labor on 12/16/2020. Patient had an uncomplicated labor course as follows:  Membrane Rupture Time/Date: 12:30 AM ,12/16/2020   Delivery Method:Vaginal, Spontaneous  Episiotomy:   Lacerations:    Patient had an uncomplicated postpartum course.  She is ambulating, tolerating a regular diet, passing flatus, and urinating well. Patient is discharged home in stable condition on 12/17/20.  Newborn Data: Birth date:12/16/2020  Birth time:10:50 AM  Gender:Female  Living status:Living  Apgars:8 ,9  Weight:3580 g   Physical exam  Vitals:   12/16/20 1914 12/16/20 2305 12/17/20 0013 12/17/20 0400  BP: 109/69  (!) 97/59 (!) 97/55  Pulse: 82  68 75  Resp: 18  20 16   Temp: 98.6 F (37 C) 98.5 F (36.9 C) 97.8 F (36.6 C) 98.1 F (36.7 C)  TempSrc: Oral Axillary Oral Oral  SpO2: 98%  99% 99%  Weight:      Height:       General: alert, cooperative and no distress Lochia: appropriate Uterine Fundus: firm Incision: N/A DVT Evaluation: No evidence of DVT seen on physical exam. Labs: Lab Results  Component Value Date   WBC 13.8 (H) 12/17/2020   HGB 9.3 (L) 12/17/2020   HCT 28.8 (L) 12/17/2020   MCV 80.7 12/17/2020   PLT 284 12/17/2020   CMP Latest Ref Rng & Units 04/20/2020  Glucose 70 - 99  mg/dL 06/21/2020)  BUN 6 - 20 mg/dL 12  Creatinine 762(G - 3.15 mg/dL 1.76  Sodium 1.60 - 737 mmol/L 137  Potassium 3.5 - 5.1 mmol/L 3.9  Chloride 98 - 111 mmol/L 105  CO2 22 - 32 mmol/L 23  Calcium 8.9 - 10.3 mg/dL 106)  Total Protein 6.5 - 8.1 g/dL 6.6  Total Bilirubin 0.3 - 1.2 mg/dL 0.5  Alkaline Phos 38 - 126 U/L 54  AST 15 - 41 U/L 16  ALT 0 - 44 U/L 10    Discharge instruction: per After Visit Summary and "Baby and Me Booklet".  After visit meds:  Allergies as of 12/17/2020      Reactions   Other Other (See Comments)   oranges Uncoded Allergy. Allergen: Shellfish, Other Reaction: Other reaction      Medication List    TAKE these medications   ibuprofen 600 MG tablet Commonly known as: ADVIL Take 1 tablet (600 mg total) by mouth every  6 (six) hours.   lansoprazole 30 MG capsule Commonly known as: Prevacid Take 1 capsule (30 mg total) by mouth daily at 12 noon.   multivitamin-prenatal 27-0.8 MG Tabs tablet Take 1 tablet by mouth daily at 12 noon.       Diet: routine diet  Activity: Advance as tolerated. Pelvic rest for 6 weeks.   Outpatient follow up:6 weeks Follow up Appt: Future Appointments  Date Time Provider Department Center  12/19/2020 10:50 AM Vena Austria, MD [facility] None   Follow up Visit:No follow-ups on file.  Postpartum contraception: IUD Paragard  Newborn Data: Live born female  Birth Weight: 7 lb 14.3 oz (3580 g) APGAR: 8, 9  Newborn Delivery   Birth date/time: 12/16/2020 10:50:00 Delivery type: Vaginal, Spontaneous      Baby Feeding: breast and bottle Disposition:home with mother   12/17/2020 Vena Austria, MD

## 2020-12-17 NOTE — Lactation Note (Signed)
This note was copied from a baby's chart. Lactation Consultation Note  Patient Name: Stefanie Sparks [id number] Date: 12/17/2020   Age:25 HOL  Lactation Rounds: LC to the room for a visit. Mother and baby are resting. Mother stated this is her fisrt time BF. She is really wanting to BF because she did not have the opportunity to with her first two children. Mother has sore nipples and is using comfort gels. Reviewed use of coconut oil and alternating use.  Mother states feeds are going well but she was unsure if baby was getting anything.  Taught hand expression and spoon feed. LC reviewed and encouraged feeding on demand and with cues. Baby latched well in cross cradle with rhythmic sucking pattern and swallows noted.  Reviewed diaper counts for days of life and when to call Peds with questions. Reviewed "understanding Postpartum and Newborn care " INJOY booklet at bedside. Reviewed outpatient Lactation number and resources. Manual pump at bedside, reviewed milk storage guidelines. Reviewed pacifier, pumping, and bottles are not encouraged until breastfeeding is established and going well in the first 4 weeks. Parents stated understanding with all teaching.   April D Ruhl 12/17/2020, 5:22 PM

## 2020-12-17 NOTE — Discharge Instructions (Signed)
Discharge Instructions:   Follow-up Appointment: Call ASAP and schedule your follow-up appointment with Dr. Bonney Aid in 6 weeks (also let them know you want paragard IUD!!)   If there are any new medications, they have been ordered and will be available for pickup at the listed pharmacy on your way home from the hospital.   Call office if you have any of the following: headache, visual changes, fever >101.0 F, chills, shortness of breath, breast concerns, excessive vaginal bleeding, incision drainage or problems, leg pain or redness, depression or any other concerns. If you have vaginal discharge with an odor, let your doctor know.   It is normal to bleed for up to 6 weeks. You should not soak through more than 1 pad in 1 hour. If you have a blood clot larger than your fist with continued bleeding, call your doctor.   Activity: Do not lift > 10 lbs for 6 weeks (do not lift anything heavier than your baby). No intercourse, tampons, swimming pools, hot tubs, baths (only showers) for 6 weeks.  No driving for 1-2 weeks. Continue prenatal vitamin, especially if breastfeeding. Increase calories and fluids (water) while breastfeeding.   Your milk will come in, in the next couple of days (right now it is colostrum). You may have a slight fever when your milk comes in, but it should go away on its own.  If it does not, and rises above 101 F please call the doctor. You will also feel achy and your breasts will be firm. They will also start to leak. If you are breastfeeding, continue as you have been and you can pump/express milk for comfort.   If you have too much milk, your breasts can become engorged, which could lead to mastitis. This is an infection of the milk ducts. It can be very painful and you will need to notify your doctor to obtain a prescription for antibiotics. You can also treat it with a shower or hot/cold compress.   For concerns about your baby, please call your pediatrician.  For  breastfeeding concerns, the lactation consultant can be reached at [phone].   Postpartum blues (feelings of happy one minute and sad another minute) are normal for the first few weeks but if it gets worse let your doctor know.   Congratulations! We enjoyed caring for you and your new bundle of joy!

## 2020-12-19 ENCOUNTER — Encounter: Payer: Medicaid Other | Admitting: Obstetrics and Gynecology

## 2020-12-19 NOTE — Anesthesia Postprocedure Evaluation (Signed)
Anesthesia Post Note  Patient: Stefanie Sparks  Procedure(s) Performed: AN AD HOC LABOR EPIDURAL  Anesthesia Type: Epidural Anesthetic complications: no Comments: Patient was discharged from hospital before Epidural team could round on her   No complications documented.   Last Vitals: There were no vitals filed for this visit.  Last Pain: There were no vitals filed for this visit.               Cleda Mccreedy Myrian Botello

## 2020-12-20 NOTE — Discharge Summary (Signed)
Patient presented for evaluation of labor.  Patient had cervical exam by RN and this was reported to me. I reviewed her vital signs and fetal tracing, both of which were reassuring.  Patient was discharge as she was not laboring.   Thomasene Mohair, MD, [facility], [facility] 12/20/2020 10:08 AM

## 2020-12-27 ENCOUNTER — Telehealth: Payer: Self-pay

## 2020-12-27 NOTE — Telephone Encounter (Signed)
Pt calling; what to do for a yeast inf; is 2wks pp.  [phone] Adv Monistat 3d or 7d; if still has sxs to call to be seen and not to use any medicine the night before appt.

## 2021-01-09 ENCOUNTER — Encounter: Payer: Self-pay | Admitting: Obstetrics

## 2021-01-09 ENCOUNTER — Other Ambulatory Visit: Payer: Self-pay

## 2021-01-09 ENCOUNTER — Ambulatory Visit (INDEPENDENT_AMBULATORY_CARE_PROVIDER_SITE_OTHER): Payer: Medicaid Other | Admitting: Obstetrics

## 2021-01-09 VITALS — BP 112/68 | Ht 59.0 in | Wt 134.0 lb

## 2021-01-09 DIAGNOSIS — N898 Other specified noninflammatory disorders of vagina: Secondary | ICD-10-CM

## 2021-01-09 MED ORDER — METRONIDAZOLE 500 MG PO TABS: 500.0000 mg | ORAL_TABLET | Freq: Two times a day (BID) | ORAL | 0 refills | Status: AC

## 2021-01-09 NOTE — Progress Notes (Signed)
Obstetrics & Gynecology Office Visit   Chief Complaint:  Chief Complaint  Patient presents with  . Gynecologic Exam    White discharge, odor, and itching. RM 4    History of Present Illness: Stefanie Sparks presents for evaluation of a vaginal discharge that has bothered her for the last few days. She is 2 weeks post vaginal delivery, and noticed a discharge that has a slight malodor. She denies any itching or irritation or burning.  Her perineum required no stitches from her delivery.  She denies any vaginal bleeding today.Denies any dysuria.  She has not had IC since her delivery.   Review of Systems:  Review of Systems  Constitutional: Negative.   HENT: Negative.   Eyes: Negative.   Respiratory: Negative.   Cardiovascular: Negative.   Gastrointestinal: Negative.   Genitourinary: Negative.   Musculoskeletal: Negative.   Skin: Negative.   Neurological: Negative.   Psychiatric/Behavioral: Negative.      Past Medical History:  Past Medical History:  Diagnosis Date  . Endometriosis   . Gastroenteritis 04/06/2016  . Migraine     Past Surgical History:  Past Surgical History:  Procedure Laterality Date  . WISDOM TOOTH EXTRACTION      Gynecologic History: No LMP recorded.  Obstetric History: [id number]  Family History:  Family History  Problem Relation Age of Onset  . Endometriosis Maternal Grandmother   . Diabetes Maternal Grandmother   . Endometriosis Mother   . Bipolar disorder Father   . Asthma Brother   . Bipolar disorder Brother   . Asthma Son   . Seizures Son   . Breast cancer Maternal Great-grandmother     Social History:  Social History   Socioeconomic History  . Marital status: Unknown    Spouse name: Demitri  . Number of children: 2  . Years of education: Not on file  . Highest education level: Not on file  Occupational History  . Not on file  Tobacco Use  . Smoking status: Current Some Day Smoker    Packs/day: 0.25    Years: 9.00    Pack  years: 2.25    Types: Cigarettes  . Smokeless tobacco: Never Used  Vaping Use  . Vaping Use: Never used  Substance and Sexual Activity  . Alcohol use: Not Currently  . Drug use: Not Currently    Types: Marijuana    Comment: Denies any Marijuana use since October 2020  . Sexual activity: Not Currently    Partners: Male    Birth control/protection: None  Other Topics Concern  . Not on file  Social History Narrative  . Not on file   Social Determinants of Health   Financial Resource Strain: Not on file  Food Insecurity: Not on file  Transportation Needs: Not on file  Physical Activity: Not on file  Stress: Not on file  Social Connections: Not on file  Intimate Partner Violence: Not At Risk  . Fear of Current or Ex-Partner: No  . Emotionally Abused: No  . Physically Abused: No  . Sexually Abused: No    Allergies:  Allergies  Allergen Reactions  . Other Other (See Comments)    oranges Uncoded Allergy. Allergen: Shellfish, Other Reaction: Other reaction    Medications: Prior to Admission medications   Medication Sig Start Date End Date Taking? Authorizing Provider  Prenatal Vit-Fe Fumarate-FA (MULTIVITAMIN-PRENATAL) 27-0.8 MG TABS tablet Take 1 tablet by mouth daily at 12 noon.   Yes [provider]  ibuprofen (ADVIL) 600 MG tablet  Take 1 tablet (600 mg total) by mouth every 6 (six) hours. Patient not taking: Reported on 01/09/2021 12/17/20   Vena Austria, MD  lansoprazole (PREVACID) 30 MG capsule Take 1 capsule (30 mg total) by mouth daily at 12 noon. Patient not taking: Reported on 01/09/2021 11/20/20   [name], CNM    Physical Exam Vitals:  Vitals:   01/09/21 1123  BP: 112/68   No LMP recorded.  Physical Exam Constitutional:      Appearance: Normal appearance. She is normal weight.  HENT:     Head: Normocephalic and atraumatic.  Cardiovascular:     Rate and Rhythm: Normal rate and regular rhythm.     Pulses: Normal pulses.     Heart  sounds: Normal heart sounds.  Pulmonary:     Effort: Pulmonary effort is normal.     Breath sounds: Normal breath sounds.  Abdominal:     Palpations: Abdomen is soft.  Genitourinary:    Comments: No external rashes or lesions. No ovious discharge. Perineum is intact. Normal vaginal rugae. Spec exam: scant vaginal discharge that appears clear-slight odor. Nuswab pending. Wet mount shows ample clue cells, no yeast or hyphae, neg whiff. Musculoskeletal:        General: Normal range of motion.     Cervical back: Neck supple.  Skin:    General: Skin is dry.  Neurological:     General: No focal deficit present.     Mental Status: She is alert and oriented to person, place, and time.  Psychiatric:        Mood and Affect: Mood normal.      Assessment: 25 y.o. [id number]  With vaginal discharge and odor at 2 weeks post vaginal delivery.   Plan: Problem List Items Addressed This Visit   None   Visit Diagnoses    Vaginal discharge    -  Primary   Relevant Orders   NuSwab Vaginitis Plus (VG+)     Will begin treatment with flagyl and notify her of the results of her Nuswab. F/U planned at her 6 wk PP visit. She plans on an IUD.  A total of 25 minutes spent providing direct clinical evaluation and care as well as counsel to the patient.  [name], CNM  01/09/2021 5:26 PM

## 2021-01-12 LAB — NUSWAB VAGINITIS PLUS (VG+)
Atopobium vaginae: HIGH Score — AB
Candida albicans, NAA: NEGATIVE
Candida glabrata, NAA: NEGATIVE
Chlamydia trachomatis, NAA: NEGATIVE
Megasphaera 1: HIGH Score — AB
Neisseria gonorrhoeae, NAA: NEGATIVE
Trich vag by NAA: NEGATIVE

## 2021-01-29 ENCOUNTER — Ambulatory Visit: Payer: Medicaid Other | Admitting: Obstetrics and Gynecology

## 2021-01-29 ENCOUNTER — Telehealth: Payer: Self-pay

## 2021-01-29 NOTE — Telephone Encounter (Signed)
Patient is scheduled for 02/15/21 with [facility] in [place] at 9:30 for paraguard placment

## 2021-02-05 NOTE — Telephone Encounter (Signed)
Noted. Will order to arrive by apt date/time. 

## 2021-02-15 ENCOUNTER — Ambulatory Visit: Payer: Medicaid Other | Admitting: Obstetrics and Gynecology

## 2021-02-15 NOTE — Telephone Encounter (Signed)
Patient states she is having car trouble and had to rescheduled. Patient is scheduled for 03/01/21 with Dr. Bonney Aid in [facility]

## 2021-03-01 ENCOUNTER — Ambulatory Visit: Payer: Medicaid Other | Admitting: Obstetrics and Gynecology

## 2021-03-08 ENCOUNTER — Encounter: Payer: Self-pay | Admitting: Obstetrics and Gynecology

## 2021-03-08 ENCOUNTER — Ambulatory Visit (INDEPENDENT_AMBULATORY_CARE_PROVIDER_SITE_OTHER): Payer: Medicaid Other | Admitting: Obstetrics and Gynecology

## 2021-03-08 ENCOUNTER — Other Ambulatory Visit: Payer: Self-pay

## 2021-03-08 ENCOUNTER — Other Ambulatory Visit (HOSPITAL_COMMUNITY)
Admission: RE | Admit: 2021-03-08 | Discharge: 2021-03-08 | Disposition: A | Discharge status: Home / Self Care | Payer: Medicaid Other | Source: Ambulatory Visit | Attending: Obstetrics and Gynecology | Admitting: Obstetrics and Gynecology

## 2021-03-08 VITALS — BP 95/62 | Ht 59.0 in | Wt 135.0 lb

## 2021-03-08 DIAGNOSIS — Z124 Encounter for screening for malignant neoplasm of cervix: Secondary | ICD-10-CM | POA: Insufficient documentation

## 2021-03-08 NOTE — Progress Notes (Signed)
Postpartum Visit  Chief Complaint:  Chief Complaint  Patient presents with  . Post-op Follow-up    6 wk postpartum - declines IUD placement or any OCP at this time. RM 3    History of Present Illness: Patient is a 25 y.o. [id number] presents for postpartum visit.  Date of delivery: 12/16/2020 Type of delivery: Vaginal delivery - Vacuum or forceps assisted  no Episiotomy No.  Laceration: no  Pregnancy or labor problems:  no Any problems since the delivery:  no  Newborn Details:  SINGLETON :  1. BabyGender female  Birth weight:   Maternal Details:  Breast or formula feeding: plans to bottle feed Intercourse: No  Contraception after delivery: No  Any bowel or bladder issues: No  Post partum depression/anxiety noted:  no Edinburgh Post-Partum Depression Score:3 Date of last PAP: not available  Review of Systems: Review of Systems  Constitutional: Negative.   Gastrointestinal: Negative.   Genitourinary: Negative.   Psychiatric/Behavioral: Negative.     The following portions of the patient's history were reviewed and updated as appropriate: allergies, current medications, past family history, past medical history, past social history, past surgical history and problem list.  Past Medical History:  Past Medical History:  Diagnosis Date  . Endometriosis   . Gastroenteritis 04/06/2016  . Migraine     Past Surgical History:  Past Surgical History:  Procedure Laterality Date  . WISDOM TOOTH EXTRACTION      Family History:  Family History  Problem Relation Age of Onset  . Endometriosis Maternal Grandmother   . Diabetes Maternal Grandmother   . Endometriosis Mother   . Bipolar disorder Father   . Asthma Brother   . Bipolar disorder Brother   . Asthma Son   . Seizures Son   . Breast cancer Maternal Great-grandmother     Social History:  Social History   Socioeconomic History  . Marital status: Single    Spouse name: Demitri  . Number of children: 2  .  Years of education: Not on file  . Highest education level: Not on file  Occupational History  . Not on file  Tobacco Use  . Smoking status: Current Some Day Smoker    Packs/day: 0.25    Years: 9.00    Pack years: 2.25    Types: Cigarettes  . Smokeless tobacco: Never Used  Vaping Use  . Vaping Use: Every day  . Substances: Flavoring  Substance and Sexual Activity  . Alcohol use: Yes    Comment: rare  . Drug use: Not Currently    Types: Marijuana    Comment: Denies any Marijuana use since October 2020  . Sexual activity: Not Currently    Partners: Male    Birth control/protection: None  Other Topics Concern  . Not on file  Social History Narrative  . Not on file   Social Determinants of Health   Financial Resource Strain: Not on file  Food Insecurity: Not on file  Transportation Needs: Not on file  Physical Activity: Not on file  Stress: Not on file  Social Connections: Not on file  Intimate Partner Violence: Not At Risk  . Fear of Current or Ex-Partner: No  . Emotionally Abused: No  . Physically Abused: No  . Sexually Abused: No    Allergies:  Allergies  Allergen Reactions  . Other Other (See Comments)    oranges Uncoded Allergy. Allergen: Shellfish, Other Reaction: Other reaction    Medications: Prior to Admission medications  Medication Sig Start Date End Date Taking? Authorizing Provider  ibuprofen (ADVIL) 600 MG tablet Take 1 tablet (600 mg total) by mouth every 6 (six) hours. Patient not taking: Reported on 01/09/2021 12/17/20   Vena Austria, MD  lansoprazole (PREVACID) 30 MG capsule Take 1 capsule (30 mg total) by mouth daily at 12 noon. Patient not taking: Reported on 01/09/2021 11/20/20   [name], CNM  Prenatal Vit-Fe Fumarate-FA (MULTIVITAMIN-PRENATAL) 27-0.8 MG TABS tablet Take 1 tablet by mouth daily at 12 noon.    [provider]    Physical Exam Blood pressure 95/62, height 4\' 11"  (1.499 m), weight 135 lb (61.2 kg), last  menstrual period 02/19/2021, unknown if currently breastfeeding.    General: NAD HEENT: normocephalic, anicteric Pulmonary: No increased work of breathing Abdomen: NABS, soft, non-tender, non-distended.  Umbilicus without lesions.  No hepatomegaly, splenomegaly or masses palpable. No evidence of hernia. Genitourinary:  External: Normal external female genitalia.  Normal urethral meatus, normal  Bartholin's and Skene's glands.    Vagina: Normal vaginal mucosa, no evidence of prolapse.    Cervix: Grossly normal in appearance, no bleeding  Uterus: Non-enlarged, mobile, normal contour.  No CMT  Adnexa: ovaries non-enlarged, no adnexal masses  Rectal: deferred Extremities: no edema, erythema, or tenderness Neurologic: Grossly intact Psychiatric: mood appropriate, affect full   Edinburgh Postnatal Depression Scale - 03/08/21 1553      Edinburgh Postnatal Depression Scale:  In the Past 7 Days   I have been able to laugh and see the funny side of things. 0    I have looked forward with enjoyment to things. 0    I have blamed myself unnecessarily when things went wrong. 1    I have been anxious or worried for no good reason. 1    I have felt scared or panicky for no good reason. 1    Things have been getting on top of me. 0    I have been so unhappy that I have had difficulty sleeping. 0    I have felt sad or miserable. 0    I have been so unhappy that I have been crying. 0    The thought of harming myself has occurred to me. 0    Edinburgh Postnatal Depression Scale Total 3           Assessment: 25 y.o. 34 presenting for 6 week postpartum visit  Plan: Problem List Items Addressed This Visit   None   Visit Diagnoses    Screening for malignant neoplasm of cervix    -  Primary   Relevant Orders   Cytology - PAP (Completed)   6 weeks postpartum follow-up           1) Contraception - Education given regarding options for contraception, as well as compatibility with breast  feeding if applicable.  Patient plans on abstinence for contraception.  2)  Pap - ASCCP guidelines and rational discussed.  ASCCP guidelines and rational discussed.  Patient opts for every 3 years screening interval  3) Patient underwent screening for postpartum depression with no signs of depression  4) Return in about 1 year (around 03/08/2022) for annual.   03/10/2022, MD, Vena Austria [facility], [facility] 03/08/2021, 3:59 PM

## 2021-03-13 LAB — CYTOLOGY - PAP

## 2021-03-25 NOTE — Progress Notes (Signed)
Colposcopy next 2-6 weeks patient aware

## 2021-03-26 ENCOUNTER — Telehealth: Payer: Self-pay

## 2021-03-26 NOTE — Telephone Encounter (Signed)
-----   Message from Vena Austria, MD sent at 03/25/2021 10:54 AM EDT ----- Colposcopy next 2-6 weeks patient aware

## 2021-03-26 NOTE — Telephone Encounter (Signed)
Called and left voicemail for patient to call back to be scheduled. 

## 2021-04-16 ENCOUNTER — Other Ambulatory Visit: Payer: Self-pay

## 2021-04-16 ENCOUNTER — Ambulatory Visit (INDEPENDENT_AMBULATORY_CARE_PROVIDER_SITE_OTHER): Payer: Medicaid Other | Admitting: Obstetrics and Gynecology

## 2021-04-16 ENCOUNTER — Encounter: Payer: Self-pay | Admitting: Obstetrics and Gynecology

## 2021-04-16 ENCOUNTER — Other Ambulatory Visit (HOSPITAL_COMMUNITY)
Admission: RE | Admit: 2021-04-16 | Discharge: 2021-04-16 | Disposition: A | Discharge status: Home / Self Care | Payer: Medicaid Other | Source: Ambulatory Visit | Attending: Obstetrics and Gynecology | Admitting: Obstetrics and Gynecology

## 2021-04-16 VITALS — BP 102/66 | Ht 59.0 in | Wt 131.0 lb

## 2021-04-16 DIAGNOSIS — R87612 Low grade squamous intraepithelial lesion on cytologic smear of cervix (LGSIL): Secondary | ICD-10-CM

## 2021-04-16 NOTE — Progress Notes (Signed)
Obstetrics & Gynecology Office Visit   Chief Complaint:  Chief Complaint  Patient presents with   Colposcopy    [place] RM 2    History of Present Illness:Stefanie Sparks is a 25 y.o. woman who presents today for continued surveillance for history of dysplasia. 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  No history of prior abnormal paps  Pap/Treatment History:  05/14/2016 NILM     Review of Systems: Review of Systems  Constitutional: Negative.   Gastrointestinal: Negative.   Genitourinary: Negative.     Past Medical History:  Patient Active Problem List   Diagnosis Date Noted   Normal labor and delivery 12/16/2020   Vaginal spotting 12/04/2020   Preterm labor in third trimester 12/02/2020   [redacted] weeks gestation of pregnancy 12/02/2020   Labor and delivery, indication for care 11/24/2020   Pregnancy related bilateral lower abdominal pain, antepartum 11/09/2020   COVID-19 affecting pregnancy in third trimester     Patient presented to [facility] on 10/23/2020 reporting a + home COVID result. Cat 1 FHTs, T-99.2. O2 sats 98% VE; closed/long/high. Discharged home with careful instructions for f/u     Pregnancy 10/19/2020   Pelvic pain affecting pregnancy in third trimester, antepartum 10/19/2020   Encounter for supervision of other normal pregnancy, third trimester 05/07/2020    Clinic [facility] Prenatal Labs  Dating LMP Blood type: O/Positive/-- (07/15 1430)   Genetic Screen NIPS:nml XX Antibody:Negative (07/15 1430)  Anatomic [place] complete Rubella: 4.74 (07/15 1430) Varicella:Equivocal  GTT Third trimester: 95 RPR: Non Reactive (07/15 1430)   Rhogam n/a HBsAg: Negative (07/15 1430)   TDaP vaccine            11/28/20      Flu Shot: HIV: Non Reactive (07/15 1430)   Baby Food   breast                             GBS: negative  Contraception  POPs Pap: 08/25/2019, NILM at [facility]  CBB  no   CS/VBAC n/a   Support Person     COVID + 10/23/2020.    Tobacco use disorder 05/07/2020    Chronic pelvic pain in female 10/02/2018   Migraine 06/17/2011    With aura/neuro s/s      Past Surgical History:  Patient Active Problem List   Diagnosis Date Noted   Normal labor and delivery 12/16/2020   Vaginal spotting 12/04/2020   Preterm labor in third trimester 12/02/2020   [redacted] weeks gestation of pregnancy 12/02/2020   Labor and delivery, indication for care 11/24/2020   Pregnancy related bilateral lower abdominal pain, antepartum 11/09/2020   COVID-19 affecting pregnancy in third trimester     Patient presented to [facility] on 10/23/2020 reporting a + home COVID result. Cat 1 FHTs, T-99.2. O2 sats 98% VE; closed/long/high. Discharged home with careful instructions for f/u     Pregnancy 10/19/2020   Pelvic pain affecting pregnancy in third trimester, antepartum 10/19/2020   Encounter for supervision of other normal pregnancy, third trimester 05/07/2020    Clinic [facility] Prenatal Labs  Dating LMP Blood type: O/Positive/-- (07/15 1430)   Genetic Screen NIPS:nml XX Antibody:Negative (07/15 1430)  Anatomic [place] complete Rubella: 4.74 (07/15 1430) Varicella:Equivocal  GTT Third trimester: 95 RPR: Non Reactive (07/15 1430)   Rhogam n/a HBsAg: Negative (07/15 1430)   TDaP vaccine            11/28/20  Flu Shot: HIV: Non Reactive (07/15 1430)   Baby Food   breast                             GBS: negative  Contraception  POPs Pap: 08/25/2019, NILM at [facility]  CBB  no   CS/VBAC n/a   Support Person     COVID + 10/23/2020.    Tobacco use disorder 05/07/2020   Chronic pelvic pain in female 10/02/2018   Migraine 06/17/2011    With aura/neuro s/s      Gynecologic History: Patient's last menstrual period was 04/09/2021.  Obstetric History: [id number]  Family History:  Family History  Problem Relation Age of Onset   Endometriosis Maternal Grandmother    Diabetes Maternal Grandmother    Endometriosis Mother    Bipolar disorder Father    Asthma Brother    Bipolar disorder Brother     Asthma Son    Seizures Son    Breast cancer Maternal Great-grandmother     Social History:  Social History   Socioeconomic History   Marital status: Single    Spouse name: Demitri   Number of children: 2   Years of education: Not on file   Highest education level: Not on file  Occupational History   Not on file  Tobacco Use   Smoking status: Some Days    Packs/day: 0.25    Years: 9.00    Pack years: 2.25    Types: Cigarettes   Smokeless tobacco: Never  Vaping Use   Vaping Use: Former   Substances: Flavoring  Substance and Sexual Activity   Alcohol use: Yes    Comment: rare   Drug use: Not Currently    Types: Marijuana    Comment: Denies any Marijuana use since October 2020   Sexual activity: Not Currently    Partners: Male    Birth control/protection: None  Other Topics Concern   Not on file  Social History Narrative   Not on file   Social Determinants of Health   Financial Resource Strain: Not on file  Food Insecurity: Not on file  Transportation Needs: Not on file  Physical Activity: Not on file  Stress: Not on file  Social Connections: Not on file  Intimate Partner Violence: Not on file    Allergies:  Allergies  Allergen Reactions   Other Other (See Comments)    oranges Uncoded Allergy. Allergen: Shellfish, Other Reaction: Other reaction    Medications: Prior to Admission medications   Not on File    Physical Exam Vitals:  Vitals:   04/16/21 1438  BP: 102/66   Patient's last menstrual period was 04/09/2021.  General: NAD HEENT: normocephalic, anicteric Thyroid: no enlargement, no palpable nodules Pulmonary: No increased work of breathing Genitourinary:  External: Normal external female genitalia.  Normal urethral meatus, normal  Bartholin's and Skene's glands.    Vagina: Normal vaginal mucosa, no evidence of prolapse.    Cervix: Grossly normal in appearance, no bleeding  Uterus: Non-enlarged, mobile, normal contour.  No  CMT  Adnexa: ovaries non-enlarged, no adnexal masses  Rectal: deferred  Lymphatic: no evidence of inguinal lymphadenopathy Extremities: no edema, erythema, or tenderness Neurologic: Grossly intact Psychiatric: mood appropriate, affect full  Female chaperone present for pelvic and breast  portions of the physical exam   GYNECOLOGY CLINIC COLPOSCOPY PROCEDURE NOTE  25 y.o. [id number] here for colposcopy for low-grade squamous intraepithelial neoplasia (LGSIL - encompassing HPV,mild dysplasia,CIN I)  pap smear on 03/08/2021. Discussed underlying role for HPV infection in the development of cervical dysplasia, its natural history and progression/regression, need for surveillance.  Is the patient  pregnant: No LMP: Patient's last menstrual period was 04/09/2021. Smoking status:  reports that she has been smoking cigarettes. She has a 2.25 pack-year smoking history. She has never used smokeless tobacco. Contraception: abstinence History of STD:  No Future fertility desired:  Yes  Patient given informed consent, signed copy in the chart, time out was performed.  The patient was position in dorsal lithotomy position. Speculum was placed the cervix was visualized.   After application of acetic acid colposcopic inspection of the cervix was undertaken.   Colposcopy adequate, full visualization of transformation zone: Yes no visible lesions; corresponding biopsies obtained 12 O;Clock random.   ECC specimen obtained:  Yes  All specimens were labeled and sent to pathology.   Patient was given post procedure instructions.  Will follow up pathology and manage accordingly.  Routine preventative health maintenance measures emphasized.  OBGyn Exam   Assessment: 25 y.o. G3P3003 follow up for LGSIL pap  Plan: Problem List Items Addressed This Visit   None Visit Diagnoses     LGSIL on Pap smear of cervix    -  Primary   Relevant Orders   Surgical pathology (Completed)       - Colposcopy  today  - I had a lengthly discussion with Mahi T Mongiello  regarding the cause of dysplasia of the lower genital tract (including immunosuppression in the setting of HPV exposure and tobacco exposure). I explained the potential for progression to invasive malignancy, the recurrent nature of these lesions (and the need for close continued followup). Results of today's pap will dictate need for further evaluation and follow up per ASCCP guidelines..  - She is comfortable with the plan and had her questions answered.  - Return in about 1 year (around 04/16/2022) for annual.    Vena Austria, MD, [facility], [facility]

## 2021-04-18 LAB — SURGICAL PATHOLOGY

## 2021-08-10 NOTE — Telephone Encounter (Signed)
Patient r/s to 03/08/21. Declined Paragard at visit.

## 2021-08-17 ENCOUNTER — Emergency Department: Payer: Medicaid Other

## 2021-08-17 ENCOUNTER — Encounter: Payer: Self-pay | Admitting: Emergency Medicine

## 2021-08-17 ENCOUNTER — Other Ambulatory Visit: Payer: Self-pay

## 2021-08-17 DIAGNOSIS — O26851 Spotting complicating pregnancy, first trimester: Secondary | ICD-10-CM | POA: Diagnosis not present

## 2021-08-17 DIAGNOSIS — O209 Hemorrhage in early pregnancy, unspecified: Secondary | ICD-10-CM

## 2021-08-17 DIAGNOSIS — O99331 Smoking (tobacco) complicating pregnancy, first trimester: Secondary | ICD-10-CM | POA: Insufficient documentation

## 2021-08-17 DIAGNOSIS — Z3A01 Less than 8 weeks gestation of pregnancy: Secondary | ICD-10-CM | POA: Diagnosis not present

## 2021-08-17 DIAGNOSIS — F17210 Nicotine dependence, cigarettes, uncomplicated: Secondary | ICD-10-CM | POA: Insufficient documentation

## 2021-08-17 DIAGNOSIS — Z8616 Personal history of COVID-19: Secondary | ICD-10-CM | POA: Diagnosis not present

## 2021-08-17 LAB — CBC WITH DIFFERENTIAL/PLATELET
Abs Immature Granulocytes: 0.01 10*3/uL (ref 0.00–0.07)
Basophils Absolute: 0 10*3/uL (ref 0.0–0.1)
Basophils Relative: 0 %
Eosinophils Absolute: 0.1 10*3/uL (ref 0.0–0.5)
Eosinophils Relative: 3 %
HCT: 39.3 % (ref 36.0–46.0)
Hemoglobin: 13.6 g/dL (ref 12.0–15.0)
Immature Granulocytes: 0 %
Lymphocytes Relative: 28 %
Lymphs Abs: 1.1 10*3/uL (ref 0.7–4.0)
MCH: 29.6 pg (ref 26.0–34.0)
MCHC: 34.6 g/dL (ref 30.0–36.0)
MCV: 85.6 fL (ref 80.0–100.0)
Monocytes Absolute: 0.4 10*3/uL (ref 0.1–1.0)
Monocytes Relative: 12 %
Neutro Abs: 2.1 10*3/uL (ref 1.7–7.7)
Neutrophils Relative %: 57 %
Platelets: 181 10*3/uL (ref 150–400)
RBC: 4.59 MIL/uL (ref 3.87–5.11)
RDW: 13.2 % (ref 11.5–15.5)
WBC: 3.7 10*3/uL — ABNORMAL LOW (ref 4.0–10.5)
nRBC: 0 % (ref 0.0–0.2)

## 2021-08-17 LAB — URINALYSIS, ROUTINE W REFLEX MICROSCOPIC
Bilirubin Urine: NEGATIVE
Glucose, UA: NEGATIVE mg/dL
Ketones, ur: NEGATIVE mg/dL
Nitrite: NEGATIVE
Protein, ur: NEGATIVE mg/dL
Specific Gravity, Urine: 1.01 (ref 1.005–1.030)
pH: 7 (ref 5.0–8.0)

## 2021-08-17 LAB — BASIC METABOLIC PANEL
Anion gap: 5 (ref 5–15)
BUN: 6 mg/dL (ref 6–20)
CO2: 25 mmol/L (ref 22–32)
Calcium: 8.8 mg/dL — ABNORMAL LOW (ref 8.9–10.3)
Chloride: 107 mmol/L (ref 98–111)
Creatinine, Ser: 0.71 mg/dL (ref 0.44–1.00)
GFR, Estimated: >60 mL/min (ref >60)
Glucose, Bld: 94 mg/dL (ref 70–99)
Potassium: 3.7 mmol/L (ref 3.5–5.1)
Sodium: 137 mmol/L (ref 135–145)

## 2021-08-17 LAB — ABO/RH: ABO/RH(D): O POS

## 2021-08-17 LAB — POC URINE PREG, ED: Preg Test, Ur: POSITIVE — AB

## 2021-08-17 LAB — HCG, QUANTITATIVE, PREGNANCY: hCG, Beta Chain, Quant, S: 268 m[IU]/mL — ABNORMAL HIGH (ref <5)

## 2021-08-17 NOTE — ED Triage Notes (Addendum)
C/O vaginal bleeding today.  States she is pregnant.  LMP in September.  States delivered a baby in February, and menstrual cycle has been irregular since delivery.  G4P3  AAOx3.  Skin warm and dry. NAD

## 2021-08-17 NOTE — ED Provider Notes (Signed)
Emergency Medicine Provider Triage Evaluation Note  Stefanie Sparks , a 25 y.o. female  was evaluated in triage.  Pt complains of vaginal bleeding in pregnancy. She had 2 periods in September. Positive pregnancy test at home and [facility]. No pain with bleeding, looks like "coffee ground discharge." .  Review of Systems  Positive:   vaginal discharge and bleeding Negative:  fever, abdominal pain   Physical Exam  Ht 4\' 11"  (1.499 m)   Wt 59.4 kg   LMP  (LMP Unknown)   BMI 26.45 kg/m  Gen:   Awake, no distress   Resp:  Normal effort  MSK:   Moves extremities without difficulty  Other:    Medical Decision Making  Medically screening exam initiated at 4:19 PM.  Appropriate orders placed.  Isabele T Leyba was informed that the remainder of the evaluation will be completed by another provider, this initial triage assessment does not replace that evaluation, and the importance of remaining in the ED until their evaluation is complete.    Andrey Campanile, FNP 08/17/21 1622    08/19/21, MD 08/17/21 2012

## 2021-08-18 ENCOUNTER — Emergency Department
Admission: EM | Admit: 2021-08-18 | Discharge: 2021-08-18 | Disposition: A | Discharge status: Home / Self Care | Payer: Medicaid Other | Attending: Emergency Medicine | Admitting: Emergency Medicine

## 2021-08-18 DIAGNOSIS — O209 Hemorrhage in early pregnancy, unspecified: Secondary | ICD-10-CM

## 2021-08-18 NOTE — Discharge Instructions (Addendum)
Your ultrasound showed signs of an early pregnancy within the uterus however your hCG levels were low today at 268.  I recommend close follow-up with OB/GYN as an outpatient to have your hCG levels rechecked next week.  Please return the emergency department if you begin having severe abdominal pain, fever of 100.4 or higher, vomiting that does not stop, vaginal bleeding that is heavy where you are soaking through more than a pad an hour for more than 2 straight hours, feel like you are going to pass out or you do pass out, have chest pain or shortness of breath.

## 2021-08-18 NOTE — ED Provider Notes (Signed)
[facility] Emergency Department Provider Note  ____________________________________________   Event Date/Time   First MD Initiated Contact with Patient 08/18/21 0142     (approximate)  I have reviewed the triage vital signs and the nursing notes.   HISTORY  Chief Complaint Vaginal Bleeding    HPI Stefanie Sparks is a 25 y.o. female G4, P3 who presents to the emergency department with vaginal bleeding and pregnancy.  States that she had 2 menstrual cycles in September.  First day of her last cycle was 07/15/2021.  States she has had a positive pregnancy test at home and is scheduled to see [facility] on November 21.  She states this morning she noticed dark brown vaginal bleeding only with wiping.  She states it has not gotten worse over the course of the day but is still present after she wipes.  She denies any abdominal pain.  She has had nausea throughout this pregnancy but no current vomiting.  No dysuria.  She has not concern for STDs.  States she just had a baby in February.      Past Medical History:  Diagnosis Date   Endometriosis    Gastroenteritis 04/06/2016   Migraine     Patient Active Problem List   Diagnosis Date Noted   Normal labor and delivery 12/16/2020   Vaginal spotting 12/04/2020   Preterm labor in third trimester 12/02/2020   [redacted] weeks gestation of pregnancy 12/02/2020   Labor and delivery, indication for care 11/24/2020   Pregnancy related bilateral lower abdominal pain, antepartum 11/09/2020   COVID-19 affecting pregnancy in third trimester    Pregnancy 10/19/2020   Pelvic pain affecting pregnancy in third trimester, antepartum 10/19/2020   Encounter for supervision of other normal pregnancy, third trimester 05/07/2020   Tobacco use disorder 05/07/2020   Chronic pelvic pain in female 10/02/2018   Migraine 06/17/2011    Past Surgical History:  Procedure Laterality Date   WISDOM TOOTH EXTRACTION      Prior to  Admission medications   Not on File    Allergies Other  Family History  Problem Relation Age of Onset   Endometriosis Maternal Grandmother    Diabetes Maternal Grandmother    Endometriosis Mother    Bipolar disorder Father    Asthma Brother    Bipolar disorder Brother    Asthma Son    Seizures Son    Breast cancer Maternal Great-grandmother     Social History Social History   Tobacco Use   Smoking status: Some Days    Packs/day: 0.25    Years: 9.00    Pack years: 2.25    Types: Cigarettes   Smokeless tobacco: Never  Vaping Use   Vaping Use: Former   Substances: Flavoring  Substance Use Topics   Alcohol use: Yes    Comment: rare   Drug use: Not Currently    Types: Marijuana    Comment: Denies any Marijuana use since October 2020    Review of Systems Constitutional: No fever. Eyes: No visual changes. ENT: No sore throat. Cardiovascular: Denies chest pain. Respiratory: Denies shortness of breath. Gastrointestinal: No nausea, vomiting, diarrhea. Genitourinary: Negative for dysuria. Musculoskeletal: Negative for back pain. Skin: Negative for rash. Neurological: Negative for focal weakness or numbness.  ____________________________________________   PHYSICAL EXAM:  VITAL SIGNS: ED Triage Vitals  Enc Vitals Group     BP 08/17/21 1620 110/80     Pulse Rate 08/17/21 1620 94     Resp 08/17/21 1620  16     Temp 08/17/21 1620 98.6 F (37 C)     Temp Source 08/17/21 1620 Oral     SpO2 08/17/21 1620 100 %     Weight 08/17/21 1559 130 lb 15.3 oz (59.4 kg)     Height 08/17/21 1559 4\' 11"  (1.499 m)     Head Circumference --      Peak Flow --      Pain Score 08/17/21 1559 0     Pain Loc --      Pain Edu? --      Excl. in GC? --    CONSTITUTIONAL: Alert and oriented and responds appropriately to questions. Well-appearing; well-nourished HEAD: Normocephalic EYES: Conjunctivae clear, pupils appear equal, EOM appear intact ENT: normal nose; moist mucous  membranes NECK: Supple, normal ROM CARD: RRR; S1 and S2 appreciated; no murmurs, no clicks, no rubs, no gallops RESP: Normal chest excursion without splinting or tachypnea; breath sounds clear and equal bilaterally; no wheezes, no rhonchi, no rales, no hypoxia or respiratory distress, speaking full sentences ABD/GI: Normal bowel sounds; non-distended; soft, non-tender, no rebound, no guarding, no peritoneal signs, no hepatosplenomegaly BACK: The back appears normal EXT: Normal ROM in all joints; no deformity noted, no edema; no cyanosis SKIN: Normal color for age and race; warm; no rash on exposed skin NEURO: Moves all extremities equally PSYCH: The patient's mood and manner are appropriate.  ____________________________________________   LABS (all labs ordered are listed, but only abnormal results are displayed)  Labs Reviewed  CBC WITH DIFFERENTIAL/PLATELET - Abnormal; Notable for the following components:      Result Value   WBC 3.7 (*)    All other components within normal limits  BASIC METABOLIC PANEL - Abnormal; Notable for the following components:   Calcium 8.8 (*)    All other components within normal limits  HCG, QUANTITATIVE, PREGNANCY - Abnormal; Notable for the following components:   hCG, Beta Chain, Quant, S 268 (*)    All other components within normal limits  URINALYSIS, ROUTINE W REFLEX MICROSCOPIC - Abnormal; Notable for the following components:   Color, Urine YELLOW (*)    APPearance CLOUDY (*)    Hgb urine dipstick LARGE (*)    Leukocytes,Ua TRACE (*)    Bacteria, UA MANY (*)    All other components within normal limits  POC URINE PREG, ED - Abnormal; Notable for the following components:   Preg Test, Ur POSITIVE (*)    All other components within normal limits  ABO/RH   ____________________________________________  EKG   ____________________________________________  RADIOLOGY I, Stefanie Sparks, personally viewed and evaluated these images (plain  radiographs) as part of my medical decision making, as well as reviewing the written report by the radiologist.  ED MD interpretation: Patient has an intrauterine gestational sac but no fetal cardiac cavity seen.  Official radiology report(s): 08/19/21 OB LESS THAN 14 WEEKS WITH OB TRANSVAGINAL  Result Date: 08/17/2021 CLINICAL DATA:  Vaginal bleeding in first trimester pregnancy. Beta HCG 268. Patient has unknown LMP. EXAM: OBSTETRIC <14 WK 08/19/2021 AND TRANSVAGINAL OB US TECHNIQUE: Both transabdominal and transvaginal ultrasound examinations were performed for complete evaluation of the gestation as well as the maternal uterus, adnexal regions, and pelvic cul-de-sac. Transvaginal technique was performed to assess early pregnancy. COMPARISON:  None this pregnancy. FINDINGS: Intrauterine gestational sac: Single Yolk sac:  Not Visualized. Embryo:  Not Visualized. Cardiac Activity: Not Visualized. MSD: 2.3 mm   4 w   6 d Subchorionic hemorrhage:  Present  measuring 4 x 5 x 10 mm. Maternal uterus/adnexae: Probable early gestational sac with adjacent subchorionic hemorrhage. The right ovary is normal measuring 2.3 x 1.6 x 1.8 cm. Ovarian blood flow is seen. The left ovary measures 3.8 x 3.3 x 2.6 cm and contains a corpus luteal cyst. Ovarian blood flow is seen. No pelvic free fluid or adnexal mass. IMPRESSION: 1. Probable early intrauterine gestational sac, but no yolk sac, fetal pole, or cardiac activity yet visualized. Recommend follow-up quantitative B-HCG levels and follow-up US in 14 days to assess viability. This recommendation follows SRU consensus guidelines: Diagnostic Criteria for Nonviable Pregnancy Early in the First Trimester. Malva Limes Med 2013; 401:0272-53. 2. Small subchorionic hemorrhage. Electronically Signed   By: Narda Rutherford M.D.   On: 08/17/2021 19:41    ____________________________________________   PROCEDURES  Procedure(s) performed (including Critical  Care):  Procedures    ____________________________________________   INITIAL IMPRESSION / ASSESSMENT AND PLAN / ED COURSE  As part of my medical decision making, I reviewed the following data within the electronic MEDICAL RECORD NUMBER Nursing notes reviewed and incorporated, Labs reviewed , Old chart reviewed, ultrasound reviewed, and Notes from prior ED visits         Patient here with first trimester bleeding.  Describes this as a brown spotting.  No abdominal pain.  No fever.  Hemoglobin normal.  Urine appears contaminated.  Not currently having urinary symptoms.  Ultrasound reviewed by myself and radiologist and shows intrauterine gestational sac but no fetal cardiac activity.  By dates patient would be approximately 4 weeks and 6 days.  Her hCG level however is quite low at 268.  I discussed this with patient that I would suspect her hCG should be higher with her dates of approximately 5 weeks.  We discussed that this is likely representative of a failing pregnancy and have recommended that she have her hCG levels rechecked at the beginning of next week by her OB/GYN.  We discussed bleeding return precautions, supportive care instructions including using Tylenol, Motrin if she begins developing cramping.  Patient has been hemodynamically stable here and her abdominal exam is benign.  She declines pelvic examination and STD screening today.  Suspicion for TOA, PID, torsion, appendicitis based on exam.  No ectopic seen on ultrasound.  I feel she is safe for discharge home.  Patient is comfortable with this plan.  At this time, I do not feel there is any life-threatening condition present. I have reviewed, interpreted and discussed all results (EKG, imaging, lab, urine as appropriate) and exam findings with patient/family. I have reviewed nursing notes and appropriate previous records.  I feel the patient is safe to be discharged home without further emergent workup and can continue workup as an  outpatient as needed. Discussed usual and customary return precautions. Patient/family verbalize understanding and are comfortable with this plan.  Outpatient follow-up has been provided as needed. All questions have been answered.  ____________________________________________   FINAL CLINICAL IMPRESSION(S) / ED DIAGNOSES  Final diagnoses:  Vaginal bleeding in pregnancy, first trimester     ED Discharge Orders     None       *Please note:  Stefanie Sparks was evaluated in Emergency Department on 08/18/2021 for the symptoms described in the history of present illness. She was evaluated in the context of the global COVID-19 pandemic, which necessitated consideration that the patient might be at risk for infection with the SARS-CoV-2 virus that causes COVID-19. Institutional protocols and algorithms that pertain  to the evaluation of patients at risk for COVID-19 are in a state of rapid change based on information released by regulatory bodies including the CDC and federal and state organizations. These policies and algorithms were followed during the patient's care in the ED.  Some ED evaluations and interventions may be delayed as a result of limited staffing during and the pandemic.*   Note:  This document was prepared using Dragon voice recognition software and may include unintentional dictation errors.    Ebert Forrester, Layla Maw, DO 08/18/21 [phone]

## 2021-08-20 ENCOUNTER — Other Ambulatory Visit: Payer: Self-pay

## 2021-08-20 ENCOUNTER — Other Ambulatory Visit: Payer: Self-pay | Admitting: Obstetrics and Gynecology

## 2021-08-20 ENCOUNTER — Other Ambulatory Visit: Payer: Medicaid Other

## 2021-08-20 ENCOUNTER — Telehealth: Payer: Self-pay

## 2021-08-20 DIAGNOSIS — O200 Threatened abortion: Secondary | ICD-10-CM

## 2021-08-20 NOTE — Telephone Encounter (Signed)
Pt calling; was seen in [place]; was told to have an HCG level repeated today; pt not sure how to go about doing that.  [phone]

## 2021-08-20 NOTE — Telephone Encounter (Signed)
Patient is scheduled for today at 3:00. Please place order. thx

## 2021-08-21 ENCOUNTER — Telehealth: Payer: Self-pay

## 2021-08-21 LAB — BETA HCG QUANT (REF LAB): hCG Quant: 66 m[IU]/mL

## 2021-08-21 NOTE — Telephone Encounter (Signed)
Pt calling; is there anything else she needs to do?  Had lab work drawn but hasn't heard anything from anyone.  [phone]  Courtesy call to pt Dr. Jean Rosenthal is in surgery today; msg will be sent to him.

## 2021-08-23 NOTE — Telephone Encounter (Signed)
Pt aware.

## 2021-09-04 NOTE — Telephone Encounter (Signed)
Pt calling; blood work from 08/20/21 showed miscarriage; it's been 3wks and preg test is still positive.  What to do from here?  [phone]

## 2021-09-05 NOTE — Telephone Encounter (Signed)
Patient is calling to follow up on message left on nurse line. Patient is aware SDJ is in office seeing patient he will get back to her asap

## 2021-09-06 ENCOUNTER — Other Ambulatory Visit: Payer: Self-pay

## 2021-09-06 ENCOUNTER — Other Ambulatory Visit: Payer: Medicaid Other

## 2021-09-06 ENCOUNTER — Other Ambulatory Visit: Payer: Self-pay | Admitting: Obstetrics and Gynecology

## 2021-09-06 DIAGNOSIS — O200 Threatened abortion: Secondary | ICD-10-CM

## 2021-09-06 DIAGNOSIS — O039 Complete or unspecified spontaneous abortion without complication: Secondary | ICD-10-CM

## 2021-09-06 NOTE — Progress Notes (Signed)
Pt is SAB, still pos UPT at home. Will recheck serum Beta. Followed by SDJ but he's not in office. Lab order placed.

## 2021-09-06 NOTE — Telephone Encounter (Signed)
Per [facility] labs drawn today (11/17).

## 2021-09-07 ENCOUNTER — Telehealth: Payer: Self-pay

## 2021-09-07 LAB — BETA HCG QUANT (REF LAB): hCG Quant: 43 m[IU]/mL

## 2021-09-07 NOTE — Telephone Encounter (Signed)
Please call this pt and get a lab appt scheduled for next Wednesday. She is aware you will be calling

## 2021-09-10 ENCOUNTER — Encounter: Payer: Medicaid Other | Admitting: Obstetrics and Gynecology

## 2021-09-12 ENCOUNTER — Other Ambulatory Visit: Payer: Self-pay

## 2021-09-12 ENCOUNTER — Other Ambulatory Visit: Payer: Self-pay | Admitting: Obstetrics and Gynecology

## 2021-09-12 ENCOUNTER — Other Ambulatory Visit: Payer: Medicaid Other

## 2021-09-12 DIAGNOSIS — E349 Endocrine disorder, unspecified: Secondary | ICD-10-CM

## 2021-09-12 DIAGNOSIS — O021 Missed abortion: Secondary | ICD-10-CM

## 2021-09-12 NOTE — Progress Notes (Signed)
G

## 2021-09-13 LAB — BETA HCG QUANT (REF LAB): hCG Quant: 44 m[IU]/mL

## 2021-09-17 ENCOUNTER — Telehealth: Payer: Self-pay

## 2021-09-17 NOTE — Telephone Encounter (Signed)
She has seen her 09/12/21 lab results from. They have only gone up by 1. Inquiring what her next steps are. Understands she did not get a call d/t holidays. [phone]

## 2021-09-17 NOTE — Progress Notes (Signed)
I was told you were following this patient beta she came in on Wednesday I don't know what the story is I don't see any notes

## 2021-09-17 NOTE — Telephone Encounter (Signed)
I'm not sure what she is being followed for I was told Stefanie Sparks was following her I didn't see any notes though she showed up on Wednesday for a beta with no lab order so I need some background from [place] [facility]

## 2021-09-17 NOTE — Telephone Encounter (Signed)
Spoke w/patient. Advised we have received her message. SDJ out of the office today. He will return tomorrow. Patient is not bleeding/hasn't bleed since 08/17/21

## 2021-09-18 ENCOUNTER — Other Ambulatory Visit: Payer: Self-pay

## 2021-09-18 ENCOUNTER — Encounter: Payer: Self-pay | Admitting: Obstetrics & Gynecology

## 2021-09-18 ENCOUNTER — Ambulatory Visit (INDEPENDENT_AMBULATORY_CARE_PROVIDER_SITE_OTHER): Payer: Medicaid Other | Admitting: Obstetrics & Gynecology

## 2021-09-18 VITALS — BP 120/80 | Ht 59.0 in | Wt 119.0 lb

## 2021-09-18 DIAGNOSIS — O021 Missed abortion: Secondary | ICD-10-CM

## 2021-09-18 NOTE — Progress Notes (Signed)
  History of Present Illness:  Stefanie Sparks is a 25 y.o. who recently underwent labwork for persistently abnormally positive pregnancy tests.  Pt had bleeding in late October, and noted low beta hCG level, then a decline.  She stopped bleeding Nov 5.  Has continued w pos urine preg tests.  Then betas have been 43, then 44.  PMHx: She  has a past medical history of Endometriosis, Gastroenteritis (04/06/2016), and Migraine. Also,  has a past surgical history that includes Wisdom tooth extraction., family history includes Asthma in her brother and son; Bipolar disorder in her brother and father; Breast cancer in her maternal great-grandmother; Diabetes in her maternal grandmother; Endometriosis in her maternal grandmother and mother; Seizures in her son.,  reports that she has been smoking cigarettes. She has a 2.25 pack-year smoking history. She has never used smokeless tobacco. She reports current alcohol use. She reports that she does not currently use drugs after having used the following drugs: Marijuana. No outpatient medications have been marked as taking for the 09/18/21 encounter (Office Visit) with Nadara Mustard, MD.  . Also, is allergic to other..  Review of Systems  All other systems reviewed and are negative.  Physical Exam:  BP 120/80   Ht 4\' 11"  (1.499 m)   Wt 119 lb (54 kg)   BMI 24.04 kg/m  Body mass index is 24.04 kg/m. Constitutional: Well nourished, well developed female in no acute distress.  Abdomen: diffusely non tender to palpation, non distended, and no masses, hernias Neuro: Grossly intact Psych:  Normal mood and affect.    Assessment:  Problem List Items Addressed This Visit     Missed abortion    -  Primary   Relevant Orders   Beta hCG quant (ref lab)  Concerned for retained tissue from miscarriage, albeit low levels.   Option of D&C to completely eradicate tissue and get closure, vs continued lab monitoring and allowing time to pass to see resolution. She  prefers to defer surgery for now. Weekly checks until zero. Monitor for bleeding or other concerning sx's.    A total of 20 minutes were spent face-to-face with the patient as well as preparation, review, communication, and documentation during this encounter.    , MD, Annamarie Major Ob/Gyn, [facility] 09/18/2021  2:26 PM

## 2021-09-18 NOTE — Telephone Encounter (Signed)
Patient is scheduled for today at 2:10 with [facility]

## 2021-09-18 NOTE — Telephone Encounter (Signed)
Sch appt w PH or [facility], work in this week, to discuss findings and plan of management moving forward

## 2021-09-19 ENCOUNTER — Other Ambulatory Visit: Payer: Self-pay | Admitting: Obstetrics & Gynecology

## 2021-09-19 ENCOUNTER — Encounter: Payer: Self-pay | Admitting: Obstetrics & Gynecology

## 2021-09-19 DIAGNOSIS — O021 Missed abortion: Secondary | ICD-10-CM

## 2021-09-19 LAB — BETA HCG QUANT (REF LAB): hCG Quant: 44 m[IU]/mL

## 2021-09-19 NOTE — Progress Notes (Signed)
Surgery Booking Request Patient Full Name:  NICLE CONNOLE  MRN: [id number]  DOB: October 05, 1996  Surgeon: Letitia Libra, MD  Requested Surgery Date and Time: 09/20/21 Primary Diagnosis AND Code: Missed abortion, Retained products Secondary Diagnosis and Code:  Surgical Procedure: Suction D&C RNFA Requested?: No L&D Notification: No Admission Status: same day surgery Length of Surgery: 25 min Special Case Needs: No H&P: No Phone Interview???:  Yes Interpreter: No Medical Clearance:  No Special Scheduling Instructions: No Any known health/anesthesia issues, diabetes, sleep apnea, latex allergy, defibrillator/pacemaker?: No Acuity: P1   (P1 highest, P2 delay may cause harm, P3 low, elective gyn, P4 lowest) Post op follow up visits: One month

## 2021-09-19 NOTE — Progress Notes (Signed)
Discussed by phone results of beta hCG.  It is still 44 (same result for 2 weeks now). Pros and cons of D&C, vs continued expectant management, discussed.  Pt elects for D&C.  Will sch for tomorrow.  Annamarie Major, MD, Merl[facility] Frederick Ob/Gyn, [facility] 09/19/2021  8:27 AM

## 2021-09-20 ENCOUNTER — Ambulatory Visit: Payer: Medicaid Other | Admitting: Certified Registered"

## 2021-09-20 ENCOUNTER — Encounter
Admission: RE | Disposition: A | Discharge status: Home / Self Care | Payer: Self-pay | Source: Ambulatory Visit | Attending: Obstetrics & Gynecology

## 2021-09-20 ENCOUNTER — Other Ambulatory Visit: Payer: Self-pay

## 2021-09-20 ENCOUNTER — Ambulatory Visit
Admission: RE | Admit: 2021-09-20 | Discharge: 2021-09-20 | Disposition: A | Discharge status: Home / Self Care | Payer: Medicaid Other | Source: Ambulatory Visit | Attending: Obstetrics & Gynecology | Admitting: Obstetrics & Gynecology

## 2021-09-20 ENCOUNTER — Encounter: Payer: Self-pay | Admitting: Obstetrics & Gynecology

## 2021-09-20 DIAGNOSIS — O021 Missed abortion: Secondary | ICD-10-CM | POA: Diagnosis not present

## 2021-09-20 HISTORY — PX: DILATION AND EVACUATION: SHX1459

## 2021-09-20 LAB — POCT PREGNANCY, URINE: Preg Test, Ur: NEGATIVE

## 2021-09-20 SURGERY — DILATION AND EVACUATION, UTERUS
Anesthesia: General | Site: Uterus

## 2021-09-20 MED ORDER — FENTANYL CITRATE (PF) 100 MCG/2ML IJ SOLN
INTRAMUSCULAR | Status: AC
  Filled 2021-09-20: qty 2

## 2021-09-20 MED ORDER — SEVOFLURANE IN SOLN
RESPIRATORY_TRACT | Status: AC
  Filled 2021-09-20: qty 250

## 2021-09-20 MED ORDER — LACTATED RINGERS IV SOLN: INTRAVENOUS | Status: DC

## 2021-09-20 MED ORDER — MIDAZOLAM HCL 2 MG/2ML IJ SOLN
INTRAMUSCULAR | Status: DC | PRN
  Administered 2021-09-20: 2 mg via INTRAVENOUS

## 2021-09-20 MED ORDER — MIDAZOLAM HCL 2 MG/2ML IJ SOLN
INTRAMUSCULAR | Status: AC
  Filled 2021-09-20: qty 2

## 2021-09-20 MED ORDER — IBUPROFEN 400 MG PO TABS
400.0000 mg | ORAL_TABLET | Freq: Four times a day (QID) | ORAL | Status: DC | PRN
  Filled 2021-09-20: qty 1

## 2021-09-20 MED ORDER — ACETAMINOPHEN 325 MG PO TABS: 650.0000 mg | ORAL_TABLET | ORAL | Status: DC | PRN

## 2021-09-20 MED ORDER — PROPOFOL 10 MG/ML IV BOLUS
INTRAVENOUS | Status: AC
  Filled 2021-09-20: qty 20

## 2021-09-20 MED ORDER — ACETAMINOPHEN 10 MG/ML IV SOLN
INTRAVENOUS | Status: AC
  Filled 2021-09-20: qty 100

## 2021-09-20 MED ORDER — ONDANSETRON HCL 4 MG/2ML IJ SOLN: 4.0000 mg | Freq: Once | INTRAMUSCULAR | Status: DC | PRN

## 2021-09-20 MED ORDER — DEXAMETHASONE SODIUM PHOSPHATE 10 MG/ML IJ SOLN
INTRAMUSCULAR | Status: AC
  Filled 2021-09-20: qty 1

## 2021-09-20 MED ORDER — POVIDONE-IODINE 10 % EX SWAB: 2.0000 "application " | Freq: Once | CUTANEOUS | Status: DC

## 2021-09-20 MED ORDER — GLYCOPYRROLATE 0.2 MG/ML IJ SOLN
INTRAMUSCULAR | Status: AC
  Filled 2021-09-20: qty 1

## 2021-09-20 MED ORDER — IBUPROFEN 400 MG PO TABS: 400.0000 mg | ORAL_TABLET | Freq: Four times a day (QID) | ORAL | 0 refills | Status: DC | PRN

## 2021-09-20 MED ORDER — ONDANSETRON HCL 4 MG/2ML IJ SOLN
INTRAMUSCULAR | Status: AC
  Filled 2021-09-20: qty 2

## 2021-09-20 MED ORDER — ACETAMINOPHEN 650 MG RE SUPP
650.0000 mg | RECTAL | Status: DC | PRN
  Filled 2021-09-20: qty 1

## 2021-09-20 MED ORDER — PROPOFOL 500 MG/50ML IV EMUL
INTRAVENOUS | Status: DC | PRN
  Administered 2021-09-20: 199 ug/kg/min via INTRAVENOUS

## 2021-09-20 MED ORDER — FENTANYL CITRATE (PF) 100 MCG/2ML IJ SOLN
INTRAMUSCULAR | Status: DC | PRN
  Administered 2021-09-20 (×2): 25 ug via INTRAVENOUS
  Administered 2021-09-20: 50 ug via INTRAVENOUS

## 2021-09-20 MED ORDER — LIDOCAINE HCL (CARDIAC) PF 100 MG/5ML IV SOSY
PREFILLED_SYRINGE | INTRAVENOUS | Status: DC | PRN
  Administered 2021-09-20: 40 mg via INTRAVENOUS

## 2021-09-20 MED ORDER — CHLORHEXIDINE GLUCONATE 0.12 % MT SOLN: 15.0000 mL | Freq: Once | OROMUCOSAL | Status: DC

## 2021-09-20 MED ORDER — DOXYCYCLINE HYCLATE 100 MG PO TABS
100.0000 mg | ORAL_TABLET | Freq: Two times a day (BID) | ORAL | Status: DC
  Filled 2021-09-20: qty 1

## 2021-09-20 MED ORDER — PROPOFOL 10 MG/ML IV BOLUS
INTRAVENOUS | Status: DC | PRN
  Administered 2021-09-20: 50 mg via INTRAVENOUS

## 2021-09-20 MED ORDER — 0.9 % SODIUM CHLORIDE (POUR BTL) OPTIME
TOPICAL | Status: DC | PRN
  Administered 2021-09-20: 500 mL

## 2021-09-20 MED ORDER — FENTANYL CITRATE (PF) 100 MCG/2ML IJ SOLN: 25.0000 ug | INTRAMUSCULAR | Status: DC | PRN

## 2021-09-20 MED ORDER — MORPHINE SULFATE (PF) 2 MG/ML IV SOLN: 1.0000 mg | INTRAVENOUS | Status: DC | PRN

## 2021-09-20 MED ORDER — ORAL CARE MOUTH RINSE: 15.0000 mL | Freq: Once | OROMUCOSAL | Status: DC

## 2021-09-20 MED ORDER — LIDOCAINE HCL (PF) 2 % IJ SOLN
INTRAMUSCULAR | Status: AC
  Filled 2021-09-20: qty 5

## 2021-09-20 MED ORDER — DEXMEDETOMIDINE (PRECEDEX) IN NS 20 MCG/5ML (4 MCG/ML) IV SYRINGE
PREFILLED_SYRINGE | INTRAVENOUS | Status: DC | PRN
  Administered 2021-09-20: 12 ug via INTRAVENOUS

## 2021-09-20 MED ORDER — DOXYCYCLINE HYCLATE 100 MG PO TABS: 100.0000 mg | ORAL_TABLET | Freq: Two times a day (BID) | ORAL | 0 refills | Status: DC

## 2021-09-20 SURGICAL SUPPLY — 29 items
BAG COUNTER SPONGE SURGICOUNT (BAG) IMPLANT
BAG SPNG CNTER NS LX DISP (BAG)
CANISTER SUC SOCK COL 7IN (MISCELLANEOUS) ×2 IMPLANT
DRSG TELFA 3X8 NADH (GAUZE/BANDAGES/DRESSINGS) ×2 IMPLANT
FILTER UTR ASPR SPEC (MISCELLANEOUS) ×1 IMPLANT
FLTR UTR ASPR SPEC (MISCELLANEOUS) ×2
GAUZE 4X4 16PLY ~~LOC~~+RFID DBL (SPONGE) ×2 IMPLANT
GLOVE SURG ENC MOIS LTX SZ8 (GLOVE) ×2 IMPLANT
GOWN STRL REUS W/ TWL LRG LVL3 (GOWN DISPOSABLE) ×1 IMPLANT
GOWN STRL REUS W/ TWL XL LVL3 (GOWN DISPOSABLE) ×1 IMPLANT
GOWN STRL REUS W/TWL LRG LVL3 (GOWN DISPOSABLE) ×2
GOWN STRL REUS W/TWL XL LVL3 (GOWN DISPOSABLE) ×2
KIT BERKELEY 1ST TRIMESTER 3/8 (MISCELLANEOUS) IMPLANT
KIT TURNOVER CYSTO (KITS) ×2 IMPLANT
MANIFOLD NEPTUNE II (INSTRUMENTS) ×2 IMPLANT
NS IRRIG 500ML POUR BTL (IV SOLUTION) ×2 IMPLANT
PACK DNC HYST (MISCELLANEOUS) ×2 IMPLANT
PAD OB MATERNITY 4.3X12.25 (PERSONAL CARE ITEMS) ×2 IMPLANT
PAD PREP 24X41 OB/GYN DISP (PERSONAL CARE ITEMS) ×2 IMPLANT
SCRUB EXIDINE 4% CHG 4OZ (MISCELLANEOUS) ×2 IMPLANT
SET BERKELEY SUCTION TUBING (SUCTIONS) ×2 IMPLANT
SET CYSTO W/LG BORE CLAMP LF (SET/KITS/TRAYS/PACK) IMPLANT
SUT VIC AB 2-0 CT1 27 (SUTURE) ×2
SUT VIC AB 2-0 CT1 TAPERPNT 27 (SUTURE) ×1 IMPLANT
TOWEL OR 17X26 4PK STRL BLUE (TOWEL DISPOSABLE) ×2 IMPLANT
VACURETTE 10 RIGID CVD (CANNULA) IMPLANT
VACURETTE 12 RIGID CVD (CANNULA) IMPLANT
VACURETTE 8 RIGID CVD (CANNULA) ×2 IMPLANT
WATER STERILE IRR 500ML POUR (IV SOLUTION) IMPLANT

## 2021-09-20 NOTE — Discharge Instructions (Signed)

## 2021-09-20 NOTE — H&P (Signed)
PRE-OPERATIVE HISTORY AND PHYSICAL EXAM  HPI:  Stefanie Sparks is a 25 y.o. [phone] No LMP recorded.; she is being admitted for surgery related to  abnormal pregnancy .  Pt had bleeding in late October, and noted low beta hCG level, then a decline.  She stopped bleeding Nov 5.  Has continued w pos urine preg tests.  Then betas have been 43, then 44, then 44 again yesterday.  PMHx: Past Medical History:  Diagnosis Date   Endometriosis    Gastroenteritis 04/06/2016   Migraine    Past Surgical History:  Procedure Laterality Date   WISDOM TOOTH EXTRACTION     Family History  Problem Relation Age of Onset   Endometriosis Maternal Grandmother    Diabetes Maternal Grandmother    Endometriosis Mother    Bipolar disorder Father    Asthma Brother    Bipolar disorder Brother    Asthma Son    Seizures Son    Breast cancer Maternal Great-grandmother    Social History   Tobacco Use   Smoking status: Some Days    Packs/day: 0.25    Years: 9.00    Pack years: 2.25    Types: Cigarettes   Smokeless tobacco: Never  Vaping Use   Vaping Use: Former   Substances: Flavoring  Substance Use Topics   Alcohol use: Yes    Comment: rare   Drug use: Not Currently    Types: Marijuana    Comment: Denies any Marijuana use since October 2020    Current Facility-Administered Medications:    chlorhexidine (PERIDEX) 0.12 % solution 15 mL, 15 mL, Mouth/Throat, Once **OR** MEDLINE mouth rinse, 15 mL, Mouth Rinse, Once, Adams, Currie Paris, MD   lactated ringers infusion, , Intravenous, Continuous, Pernell Dupre, Currie Paris, MD   lactated ringers infusion, , Intravenous, Continuous, Tiburcio Pea Harrel Lemon, MD   povidone-iodine 10 % swab 2 application, 2 application, Topical, Once, Tiburcio Pea Harrel Lemon, MD Allergies: Other  Review of Systems  Constitutional:  Negative for chills, fever and malaise/fatigue.  HENT:  Negative for congestion, sinus pain and sore throat.   Eyes:  Negative for blurred vision and pain.   Respiratory:  Negative for cough and wheezing.   Cardiovascular:  Negative for chest pain and leg swelling.  Gastrointestinal:  Negative for abdominal pain, constipation, diarrhea, heartburn, nausea and vomiting.  Genitourinary:  Negative for dysuria, frequency, hematuria and urgency.  Musculoskeletal:  Negative for back pain, joint pain, myalgias and neck pain.  Skin:  Negative for itching and rash.  Neurological:  Negative for dizziness, tremors and weakness.  Endo/Heme/Allergies:  Does not bruise/bleed easily.  Psychiatric/Behavioral:  Negative for depression. The patient is not nervous/anxious and does not have insomnia.    Objective: There were no vitals taken for this visit. There were no vitals filed for this visit. Physical Exam Constitutional:      General: She is not in acute distress.    Appearance: She is well-developed.  HENT:     Head: Normocephalic and atraumatic. No laceration.     Right Ear: Hearing normal.     Left Ear: Hearing normal.     Mouth/Throat:     Pharynx: Uvula midline.  Eyes:     Pupils: Pupils are equal, round, and reactive to light.  Neck:     Thyroid: No thyromegaly.  Cardiovascular:     Rate and Rhythm: Normal rate and regular rhythm.     Heart sounds: No murmur heard.   No friction rub. No gallop.  Pulmonary:     Effort: Pulmonary effort is normal. No respiratory distress.     Breath sounds: Normal breath sounds. No wheezing.  Abdominal:     General: Bowel sounds are normal. There is no distension.     Palpations: Abdomen is soft.     Tenderness: There is no abdominal tenderness. There is no rebound.  Musculoskeletal:        General: Normal range of motion.     Cervical back: Normal range of motion and neck supple.  Neurological:     Mental Status: She is alert and oriented to person, place, and time.     Cranial Nerves: No cranial nerve deficit.  Skin:    General: Skin is warm and dry.  Psychiatric:        Judgment: Judgment normal.   Vitals reviewed.    Assessment: Missed abortion Retained Products of Conception  Plan D&C  I have had a careful discussion with this patient about all the options available and the risk/benefits of each. I have fully informed this patient that surgery may subject her to a variety of discomforts and risks: She understands that most patients have surgery with little difficulty, but problems can happen ranging from minor to fatal. These include nausea, vomiting, pain, bleeding, infection, poor healing, hernia, or formation of adhesions. Unexpected reactions may occur from any drug or anesthetic given. Unintended injury may occur to other pelvic or abdominal structures such as Fallopian tubes, ovaries, bladder, ureter (tube from kidney to bladder), or bowel. Nerves going from the pelvis to the legs may be injured. Any such injury may require immediate or later additional surgery to correct the problem. Excessive blood loss requiring transfusion is very unlikely but possible. Dangerous blood clots may form in the legs or lungs. Physical and sexual activity will be restricted in varying degrees for an indeterminate period of time but most often 2-6 weeks.  Finally, she understands that it is impossible to list every possible undesirable effect and that the condition for which surgery is done is not always cured or significantly improved, and in rare cases may be even worse.Ample time was given to answer all questions.  Barnett Applebaum, MD, [facility], [facility] 09/20/2021  3:27 PM

## 2021-09-20 NOTE — Op Note (Signed)
  Operative Note  09/20/2021 4:30 PM  PRE-OP DIAGNOSIS: Missed abortion, Retained products   POST-OP DIAGNOSIS: same  SURGEON: Annamarie Major, MD, FACOG  ANESTHESIA: General   PROCEDURE: Procedure(s): DILATATION AND EVACUATION   ESTIMATED BLOOD LOSS: Minimal   SPECIMENS: POC   COMPLICATIONS: none  DISPOSITION: PACU - hemodynamically stable.  CONDITION: stable  FINDINGS: Exam under anesthesia revealed a 6 wk size uterus without palpable adnexal masses.   INDICATION FOR PROCEDURE: Persistence of beta hCG levels at 44.  PROCEDURE IN DETAIL: After informed consent was obtained, the patient was taken to the operating room where anesthesia was obtained without difficulty. The patient was positioned in the dorsal lithotomy position with [employer]. Time out was performed and an exam under anesthesia was performed. The vagina, perineum, and lower abdomen were prepped and draped in a normal sterile fashion. The bladder was emptied with an I&O catheter. A speculum was placed into the vagina and the cervix was grasped with a single toothed tenaculum. The uterus was sounded to 7cm.  The cervix was gently dilated to 20 [place] with  [employer] dilators. The suction was then tested and found to be adequate, and a 32mm rigid suction cannula was advanced into the uterine cavity. The suction was activated and the contents of the uterus were aspirated until no further tissue was obtained. The uterus was then curetted to gritty texture throughout.  At the end of the procedure bleeding was noted to beMinimal.  All instruments were then removed from the vagina.The patient tolerated the procedure well. All sponge, instrument, and needle counts were correct. The patient was taken to the recovery room in good condition.   Annamarie Major, MD, [facility], [facility] 09/20/2021  4:30 PM

## 2021-09-20 NOTE — Progress Notes (Signed)
Follow up with any MD in the next 2-3 weeks

## 2021-09-20 NOTE — Transfer of Care (Signed)
Immediate Anesthesia Transfer of Care Note  Patient: Stefanie Sparks  Procedure(s) Performed: DILATATION AND EVACUATION (Uterus)  Patient Location: PACU  Anesthesia Type:MAC  Level of Consciousness: awake, alert  and oriented  Airway & Oxygen Therapy: Patient Spontanous Breathing and Patient connected to nasal cannula oxygen  Post-op Assessment: Report given to RN and Post -op Vital signs reviewed and stable  Post vital signs: Reviewed and stable  Last Vitals:  Vitals Value Taken Time  BP    Temp    Pulse    Resp    SpO2      Last Pain:  Vitals:   09/20/21 1530  TempSrc: Temporal  PainSc: 0-No pain         Complications: No notable events documented.

## 2021-09-20 NOTE — Anesthesia Preprocedure Evaluation (Signed)
Anesthesia Evaluation  Patient identified by MRN, date of birth, ID band Patient awake    Reviewed: Allergy & Precautions, H&P , NPO status , Patient's Chart, lab work & pertinent test results, reviewed documented beta blocker date and time   Airway Mallampati: II  TM Distance: >3 FB Neck ROM: full    Dental  (+) Poor Dentition   Pulmonary neg pulmonary ROS, Current Smoker,    Pulmonary exam normal        Cardiovascular Exercise Tolerance: Good negative cardio ROS Normal cardiovascular exam Rhythm:regular Rate:Normal     Neuro/Psych  Headaches, negative psych ROS   GI/Hepatic negative GI ROS, Neg liver ROS,   Endo/Other  negative endocrine ROS  Renal/GU negative Renal ROS  negative genitourinary   Musculoskeletal   Abdominal   Peds  Hematology negative hematology ROS (+)   Anesthesia Other Findings Past Medical History: No date: Endometriosis 04/06/2016: Gastroenteritis No date: Migraine Past Surgical History: No date: WISDOM TOOTH EXTRACTION BMI    Body Mass Index: 24.04 kg/m     Reproductive/Obstetrics negative OB ROS                             Anesthesia Physical Anesthesia Plan  ASA: 3  Anesthesia Plan: General   Post-op Pain Management:    Induction:   PONV Risk Score and Plan: 3  Airway Management Planned:   Additional Equipment:   Intra-op Plan:   Post-operative Plan:   Informed Consent: I have reviewed the patients History and Physical, chart, labs and discussed the procedure including the risks, benefits and alternatives for the proposed anesthesia with the patient or authorized representative who has indicated his/her understanding and acceptance.     Dental Advisory Given  Plan Discussed with: CRNA  Anesthesia Plan Comments:         Anesthesia Quick Evaluation

## 2021-09-21 ENCOUNTER — Encounter: Payer: Self-pay | Admitting: Obstetrics & Gynecology

## 2021-09-22 NOTE — Anesthesia Postprocedure Evaluation (Signed)
Anesthesia Post Note  Patient: [name]  Procedure(s) Performed: DILATATION AND EVACUATION (Uterus)  Patient location during evaluation: PACU Anesthesia Type: General Level of consciousness: awake and alert Pain management: pain level controlled Vital Signs Assessment: post-procedure vital signs reviewed and stable Respiratory status: spontaneous breathing, nonlabored ventilation, respiratory function stable and patient connected to nasal cannula oxygen Cardiovascular status: blood pressure returned to baseline and stable Postop Assessment: no apparent nausea or vomiting Anesthetic complications: no   No notable events documented.   Last Vitals:  Vitals:   09/20/21 1700 09/20/21 1721  BP: 92/60 93/60  Pulse: 79 62  Resp: 16 16  Temp: (!) 35.8 C 36.4 C  SpO2: 98% 97%    Last Pain:  Vitals:   09/21/21 0813  TempSrc:   PainSc: 0-No pain                 Yevette Edwards

## 2021-09-24 LAB — SURGICAL PATHOLOGY

## 2021-10-19 ENCOUNTER — Ambulatory Visit (INDEPENDENT_AMBULATORY_CARE_PROVIDER_SITE_OTHER): Payer: Medicaid Other | Admitting: Obstetrics & Gynecology

## 2021-10-19 ENCOUNTER — Encounter: Payer: Self-pay | Admitting: Obstetrics & Gynecology

## 2021-10-19 ENCOUNTER — Other Ambulatory Visit: Payer: Self-pay

## 2021-10-19 VITALS — BP 120/80 | Ht 59.0 in | Wt 119.0 lb

## 2021-10-19 DIAGNOSIS — E349 Endocrine disorder, unspecified: Secondary | ICD-10-CM

## 2021-10-19 NOTE — Progress Notes (Signed)
°  Postoperative Follow-up Patient presents post op from  [facility]  for  persistnetly elevated beta hcg levels after miscarriage (staid in the 40's range for several occasions) , 1 month ago.  Pt had early rise in beta levels, and [place] 08/17/21 with gest sac in uterus but no fetal pole. Levels as high as 268, then 66, 43, 44, 44.  Subjective: Patient reports  no pain or bleeding, and has not yet had a period.   Eating a regular diet without difficulty. The patient is not having any pain.  Activity: normal activities of daily living. Patient reports additional symptom's since surgery of None.  Objective: BP 120/80    Ht 4\' 11"  (1.499 m)    Wt 119 lb (54 kg)    LMP 07/15/2021    BMI 24.04 kg/m  Physical Exam Constitutional:      General: She is not in acute distress.    Appearance: She is well-developed.  Musculoskeletal:        General: Normal range of motion.  Neurological:     Mental Status: She is alert and oriented to person, place, and time.  Skin:    General: Skin is warm and dry.  Vitals reviewed.    Assessment: s/p :   D&C  progressing well  Plan: Patient has done well after surgery with no apparent complications.  I have discussed the post-operative course to date, and the expected progress moving forward.  The patient understands what complications to be concerned about.  I will see the patient in routine follow up, or sooner if needed.    Activity plan: No restriction.  Beta hCG today Path discussed (no POC) If still elevated, will need to investigate further  07/17/2021 10/19/2021, 11:34 AM

## 2021-10-20 LAB — BETA HCG QUANT (REF LAB): hCG Quant: <1 m[IU]/mL

## 2022-02-05 ENCOUNTER — Ambulatory Visit
Admission: RE | Admit: 2022-02-05 | Discharge: 2022-02-05 | Disposition: A | Discharge status: Home / Self Care | Payer: Medicaid Other | Source: Ambulatory Visit | Attending: Emergency Medicine | Admitting: Emergency Medicine

## 2022-02-05 VITALS — BP 105/68 | HR 95 | Temp 98.1°F | Resp 18

## 2022-02-05 DIAGNOSIS — Z3201 Encounter for pregnancy test, result positive: Secondary | ICD-10-CM

## 2022-02-05 DIAGNOSIS — Z3A01 Less than 8 weeks gestation of pregnancy: Secondary | ICD-10-CM

## 2022-02-05 LAB — POCT URINE PREGNANCY: Preg Test, Ur: POSITIVE — AB

## 2022-02-05 NOTE — Discharge Instructions (Addendum)
Your pregnancy test is positive.  Start taking prenatal vitamins today.  Do not take over-the-counter medications unless approved by your OB/GYN.  Schedule an appointment with your OB/GYN as soon as possible. ?

## 2022-02-05 NOTE — ED Provider Notes (Signed)
?[facility] ? ? ? ?CSN: [id number] ?Arrival date & time: 02/05/22  0846 ? ? ?  ? ?History   ?Chief Complaint ?Chief Complaint  ?Patient presents with  ? Possible Pregnancy  ?  Preferred bloodwork for HCG levels, history of miscarriage - Entered by patient  ? ? ?HPI ?Stefanie Sparks is a 26 y.o. female.  Patient presents with request for pregnancy test.  She had a miscarriage last October.  Her last menstrual cycle was 12/28/2021.  She denies abdominal pain, vaginal discharge, vaginal bleeding, pelvic pain, dysuria, or other symptoms. ? ?The history is provided by the patient.  ? ?Past Medical History:  ?Diagnosis Date  ? Endometriosis   ? Gastroenteritis 04/06/2016  ? Migraine   ? ? ?Patient Active Problem List  ? Diagnosis Date Noted  ? Missed abortion 09/20/2021  ? Normal labor and delivery 12/16/2020  ? Vaginal spotting 12/04/2020  ? Preterm labor in third trimester 12/02/2020  ? [redacted] weeks gestation of pregnancy 12/02/2020  ? Labor and delivery, indication for care 11/24/2020  ? Pregnancy related bilateral lower abdominal pain, antepartum 11/09/2020  ? COVID-19 affecting pregnancy in third trimester   ? Pregnancy 10/19/2020  ? Pelvic pain affecting pregnancy in third trimester, antepartum 10/19/2020  ? Encounter for supervision of other normal pregnancy, third trimester 05/07/2020  ? Tobacco use disorder 05/07/2020  ? Chronic pelvic pain in female 10/02/2018  ? Migraine 06/17/2011  ? ? ?Past Surgical History:  ?Procedure Laterality Date  ? DILATION AND EVACUATION N/A 09/20/2021  ? Procedure: DILATATION AND EVACUATION;  Surgeon: Nadara MustardHarris, Robert P, MD;  Location: [facility] ORS;  Service: Gynecology;  Laterality: N/A;  ? WISDOM TOOTH EXTRACTION    ? ? ?OB History   ? ? Gravida  ?4  ? Para  ?3  ? Term  ?3  ? Preterm  ?0  ? AB  ?0  ? Living  ?3  ?  ? ? SAB  ?0  ? IAB  ?0  ? Ectopic  ?0  ? Multiple  ?0  ? Live Births  ?3  ?   ?  ?  ? ? ? ?Home Medications   ? ?Prior to Admission medications   ?Medication Sig Start Date  End Date Taking? Authorizing Provider  ?doxycycline (VIBRA-TABS) 100 MG tablet Take 1 tablet (100 mg total) by mouth every 12 (twelve) hours. ?Patient not taking: Reported on 10/19/2021 09/20/21   Nadara MustardHarris, Robert P, MD  ?ibuprofen (ADVIL) 400 MG tablet Take 1 tablet (400 mg total) by mouth every 6 (six) hours as needed for moderate pain. ?Patient not taking: Reported on 10/19/2021 09/20/21   Nadara MustardHarris, Robert P, MD  ? ? ?Family History ?Family History  ?Problem Relation Age of Onset  ? Endometriosis Maternal Grandmother   ? Diabetes Maternal Grandmother   ? Endometriosis Mother   ? Bipolar disorder Father   ? Asthma Brother   ? Bipolar disorder Brother   ? Asthma Son   ? Seizures Son   ? Breast cancer Maternal Great-grandmother   ? ? ?Social History ?Social History  ? ?Tobacco Use  ? Smoking status: Some Days  ?  Packs/day: 0.25  ?  Years: 9.00  ?  Pack years: 2.25  ?  Types: Cigarettes  ? Smokeless tobacco: Never  ?Vaping Use  ? Vaping Use: Former  ? Substances: Flavoring  ?Substance Use Topics  ? Alcohol use: Yes  ?  Comment: rare  ? Drug use: Not Currently  ?  Types: Marijuana  ?  Comment: Denies any Marijuana use since October 2020  ? ? ? ?Allergies   ?Other ? ? ?Review of Systems ?Review of Systems  ?Gastrointestinal:  Negative for abdominal pain, diarrhea, nausea and vomiting.  ?Genitourinary:  Negative for dysuria, flank pain, hematuria, pelvic pain, vaginal bleeding and vaginal discharge.  ?All other systems reviewed and are negative. ? ? ?Physical Exam ?Triage Vital Signs ?ED Triage Vitals  ?Enc Vitals Group  ?   BP 02/05/22 0913 105/68  ?   Pulse Rate 02/05/22 0913 95  ?   Resp 02/05/22 0913 18  ?   Temp 02/05/22 0913 98.1 ?F (36.7 ?C)  ?   Temp src --   ?   SpO2 02/05/22 0913 98 %  ?   Weight --   ?   Height --   ?   Head Circumference --   ?   Peak Flow --   ?   Pain Score 02/05/22 0917 0  ?   Pain Loc --   ?   Pain Edu? --   ?   Excl. in GC? --   ? ?No data found. ? ?Updated Vital Signs ?BP 105/68   Pulse 95    Temp 98.1 ?F (36.7 ?C)   Resp 18   LMP 12/28/2021 (Exact Date)   SpO2 98%  ? ?Visual Acuity ?Right Eye Distance:   ?Left Eye Distance:   ?Bilateral Distance:   ? ?Right Eye Near:   ?Left Eye Near:    ?Bilateral Near:    ? ?Physical Exam ?Vitals and nursing note reviewed.  ?Constitutional:   ?   General: She is not in acute distress. ?   Appearance: Normal appearance. She is well-developed. She is not ill-appearing.  ?HENT:  ?   Mouth/Throat:  ?   Mouth: Mucous membranes are moist.  ?Cardiovascular:  ?   Rate and Rhythm: Normal rate and regular rhythm.  ?Pulmonary:  ?   Effort: Pulmonary effort is normal. No respiratory distress.  ?   Breath sounds: Normal breath sounds.  ?Abdominal:  ?   General: Bowel sounds are normal.  ?   Palpations: Abdomen is soft.  ?   Tenderness: There is no abdominal tenderness. There is no right CVA tenderness, left CVA tenderness, guarding or rebound.  ?Musculoskeletal:  ?   Cervical back: Neck supple.  ?Skin: ?   General: Skin is warm and dry.  ?Neurological:  ?   Mental Status: She is alert.  ?Psychiatric:     ?   Mood and Affect: Mood normal.     ?   Behavior: Behavior normal.  ? ? ? ?UC Treatments / Results  ?Labs ?(all labs ordered are listed, but only abnormal results are displayed) ?Labs Reviewed  ?POCT URINE PREGNANCY - Abnormal; Notable for the following components:  ?    Result Value  ? Preg Test, Ur Positive (*)   ? All other components within normal limits  ? ? ?EKG ? ? ?Radiology ?No results found. ? ?Procedures ?Procedures (including critical care time) ? ?Medications Ordered in UC ?Medications - No data to display ? ?Initial Impression / Assessment and Plan / UC Course  ?I have reviewed the triage vital signs and the nursing notes. ? ?Pertinent labs & imaging results that were available during my care of the patient were reviewed by me and considered in my medical decision making (see chart for details). ? ?Positive pregnancy test, less than [redacted] weeks gestation of  pregnancy.  Urine pregnancy test  positive.  Instructed patient to start prenatal vitamins and to schedule an appointment with her OB/GYN as soon as possible.  Cautioned patient to avoid OTC medications unless approved by her OB/GYN.  Education provided on first trimester pregnancy.  Patient has 3 other children and reports she is familiar with pregnancy care.  She agrees to plan of care. ? ? ?Final Clinical Impressions(s) / UC Diagnoses  ? ?Final diagnoses:  ?Positive pregnancy test  ?Less than [redacted] weeks gestation of pregnancy  ? ? ? ?Discharge Instructions   ? ?  ?Your pregnancy test is positive.  Start taking prenatal vitamins today.  Do not take over-the-counter medications unless approved by your OB/GYN.  Schedule an appointment with your OB/GYN as soon as possible. ? ? ? ? ?ED Prescriptions   ?None ?  ? ?PDMP not reviewed this encounter. ?  ?Mickie Bail, NP ?02/05/22 [phone] ? ?

## 2022-02-05 NOTE — ED Triage Notes (Addendum)
Pt here to confirm possible pregnancy LMP 12/28/21 ?

## 2022-02-09 ENCOUNTER — Other Ambulatory Visit: Payer: Self-pay

## 2022-02-09 ENCOUNTER — Encounter: Payer: Self-pay | Admitting: Emergency Medicine

## 2022-02-09 DIAGNOSIS — O209 Hemorrhage in early pregnancy, unspecified: Secondary | ICD-10-CM | POA: Diagnosis not present

## 2022-02-09 DIAGNOSIS — M542 Cervicalgia: Secondary | ICD-10-CM | POA: Insufficient documentation

## 2022-02-09 DIAGNOSIS — Z20822 Contact with and (suspected) exposure to covid-19: Secondary | ICD-10-CM | POA: Diagnosis not present

## 2022-02-09 DIAGNOSIS — N9489 Other specified conditions associated with female genital organs and menstrual cycle: Secondary | ICD-10-CM | POA: Insufficient documentation

## 2022-02-09 DIAGNOSIS — Z3A00 Weeks of gestation of pregnancy not specified: Secondary | ICD-10-CM | POA: Diagnosis not present

## 2022-02-09 DIAGNOSIS — R519 Headache, unspecified: Secondary | ICD-10-CM | POA: Insufficient documentation

## 2022-02-09 LAB — RESP PANEL BY RT-PCR (FLU A&B, COVID) ARPGX2
Influenza A by PCR: NEGATIVE
Influenza B by PCR: NEGATIVE
SARS Coronavirus 2 by RT PCR: NEGATIVE

## 2022-02-09 LAB — GROUP A STREP BY PCR: Group A Strep by PCR: NOT DETECTED

## 2022-02-09 NOTE — ED Triage Notes (Signed)
Pt to ED via POV, states had a + preg test at [facility] on 4/18, states is unable to get in with her OB until 5/12. Pt states HA x2 weeks, fever, sore throat. Pt states fever and sore throat started last night, states took Tylenol for fever. Pt states she would like to be evaluated for pregnancy because she is concerned her symptoms are related despite recent exposure for strep. Pt denies further symptoms at this time.  ?

## 2022-02-09 NOTE — ED Notes (Signed)
Pt's care discussed with Milana Huntsman, [facility] for covid and strep swab, no further orders at this time.  ?

## 2022-02-10 ENCOUNTER — Emergency Department: Payer: Medicaid Other

## 2022-02-10 ENCOUNTER — Emergency Department
Admission: EM | Admit: 2022-02-10 | Discharge: 2022-02-10 | Disposition: A | Discharge status: Home / Self Care | Payer: Medicaid Other | Attending: Emergency Medicine | Admitting: Emergency Medicine

## 2022-02-10 DIAGNOSIS — O4690 Antepartum hemorrhage, unspecified, unspecified trimester: Secondary | ICD-10-CM

## 2022-02-10 DIAGNOSIS — R519 Headache, unspecified: Secondary | ICD-10-CM

## 2022-02-10 LAB — CBC WITH DIFFERENTIAL/PLATELET
Abs Immature Granulocytes: 0.01 10*3/uL (ref 0.00–0.07)
Basophils Absolute: 0 10*3/uL (ref 0.0–0.1)
Basophils Relative: 1 %
Eosinophils Absolute: 0.1 10*3/uL (ref 0.0–0.5)
Eosinophils Relative: 1 %
HCT: 37.2 % (ref 36.0–46.0)
Hemoglobin: 12 g/dL (ref 12.0–15.0)
Immature Granulocytes: 0 %
Lymphocytes Relative: 13 %
Lymphs Abs: 0.8 10*3/uL (ref 0.7–4.0)
MCH: 28.7 pg (ref 26.0–34.0)
MCHC: 32.3 g/dL (ref 30.0–36.0)
MCV: 89 fL (ref 80.0–100.0)
Monocytes Absolute: 0.7 10*3/uL (ref 0.1–1.0)
Monocytes Relative: 11 %
Neutro Abs: 4.6 10*3/uL (ref 1.7–7.7)
Neutrophils Relative %: 74 %
Platelets: 199 10*3/uL (ref 150–400)
RBC: 4.18 MIL/uL (ref 3.87–5.11)
RDW: 12.7 % (ref 11.5–15.5)
WBC: 6.2 10*3/uL (ref 4.0–10.5)
nRBC: 0 % (ref 0.0–0.2)

## 2022-02-10 LAB — URINALYSIS, ROUTINE W REFLEX MICROSCOPIC
Bilirubin Urine: NEGATIVE
Glucose, UA: NEGATIVE mg/dL
Ketones, ur: NEGATIVE mg/dL
Leukocytes,Ua: NEGATIVE
Nitrite: NEGATIVE
Protein, ur: NEGATIVE mg/dL
Specific Gravity, Urine: 1.003 — ABNORMAL LOW (ref 1.005–1.030)
pH: 6 (ref 5.0–8.0)

## 2022-02-10 LAB — COMPREHENSIVE METABOLIC PANEL
ALT: 9 U/L (ref 0–44)
AST: 12 U/L — ABNORMAL LOW (ref 15–41)
Albumin: 3.5 g/dL (ref 3.5–5.0)
Alkaline Phosphatase: 51 U/L (ref 38–126)
Anion gap: 6 (ref 5–15)
BUN: 7 mg/dL (ref 6–20)
CO2: 19 mmol/L — ABNORMAL LOW (ref 22–32)
Calcium: 8.1 mg/dL — ABNORMAL LOW (ref 8.9–10.3)
Chloride: 113 mmol/L — ABNORMAL HIGH (ref 98–111)
Creatinine, Ser: 0.52 mg/dL (ref 0.44–1.00)
GFR, Estimated: >60 mL/min (ref >60)
Glucose, Bld: 110 mg/dL — ABNORMAL HIGH (ref 70–99)
Potassium: 3.3 mmol/L — ABNORMAL LOW (ref 3.5–5.1)
Sodium: 138 mmol/L (ref 135–145)
Total Bilirubin: 0.2 mg/dL — ABNORMAL LOW (ref 0.3–1.2)
Total Protein: 6.2 g/dL — ABNORMAL LOW (ref 6.5–8.1)

## 2022-02-10 LAB — ABO/RH: ABO/RH(D): O POS

## 2022-02-10 LAB — HCG, QUANTITATIVE, PREGNANCY: hCG, Beta Chain, Quant, S: 45 m[IU]/mL — ABNORMAL HIGH (ref <5)

## 2022-02-10 MED ORDER — SODIUM CHLORIDE 0.9 % IV BOLUS (SEPSIS)
1000.0000 mL | Freq: Once | INTRAVENOUS | Status: AC
  Administered 2022-02-10: 1000 mL via INTRAVENOUS

## 2022-02-10 MED ORDER — METOCLOPRAMIDE HCL 5 MG/ML IJ SOLN
10.0000 mg | Freq: Once | INTRAMUSCULAR | Status: AC
  Administered 2022-02-10: 10 mg via INTRAVENOUS
  Filled 2022-02-10: qty 2

## 2022-02-10 MED ORDER — ACETAMINOPHEN 500 MG PO TABS
1000.0000 mg | ORAL_TABLET | Freq: Once | ORAL | Status: AC
  Administered 2022-02-10: 1000 mg via ORAL
  Filled 2022-02-10 (×2): qty 2

## 2022-02-10 MED ORDER — IOHEXOL 350 MG/ML SOLN
75.0000 mL | Freq: Once | INTRAVENOUS | Status: AC | PRN
  Administered 2022-02-10: 75 mL via INTRAVENOUS

## 2022-02-10 MED ORDER — POTASSIUM CHLORIDE CRYS ER 20 MEQ PO TBCR
40.0000 meq | EXTENDED_RELEASE_TABLET | Freq: Once | ORAL | Status: AC
  Administered 2022-02-10: 40 meq via ORAL
  Filled 2022-02-10: qty 2

## 2022-02-10 NOTE — ED Notes (Signed)
Patient resting comfortably on stretcher with eyes closed. RR even and unlabored. Patient verbalizes no needs or complaints at this time. Mother remains at bedside. ?

## 2022-02-10 NOTE — ED Notes (Signed)
Patient resting comfortably on stretcher with eyes closed. RR even and unlabored. Patient verbalizes no needs or complaints at this time. Mother remains at bedside. ?

## 2022-02-10 NOTE — ED Notes (Signed)
Patient resting comfortably on stretcher. RR even and unlabored. Patient states her nausea is better after medications. Mother is at bedside. Patient is awaiting her [place].  ?

## 2022-02-10 NOTE — ED Provider Notes (Signed)
? ?[facility] ?Provider Note ? ? ? Event Date/Time  ? First MD Initiated Contact with Patient 02/10/22 0025   ?  (approximate) ? ? ?History  ? ?URI ? ? ?HPI ? ?Stefanie Sparks is a 26 y.o. female G5, P4 with history of endometriosis, migraines who presents to the emergency department with complaints of posterior headache and neck pain that has been ongoing now for 10 days.  States this does not feel like her typical headaches.  No head injury.  She initially thought it was because she was exposed to her daughter who had strep throat and she was having chills, cough and sore throat.  No fever.  No neck stiffness.  No numbness, tingling or weakness.  No abdominal pain, nausea, vomiting, diarrhea.  No dysuria, hematuria, vaginal bleeding or discharge.  She is currently pregnant.  States her last menstrual period was "sometime in the beginning of March".  She has not had an ultrasound for this pregnancy and has not seen an OB/GYN. ? ? ?History provided by patient and mother. ? ? ? ?Past Medical History:  ?Diagnosis Date  ? Endometriosis   ? Gastroenteritis 04/06/2016  ? Migraine   ? ? ?Past Surgical History:  ?Procedure Laterality Date  ? DILATION AND EVACUATION N/A 09/20/2021  ? Procedure: DILATATION AND EVACUATION;  Surgeon: Nadara MustardHarris, Robert P, MD;  Location: [facility] ORS;  Service: Gynecology;  Laterality: N/A;  ? WISDOM TOOTH EXTRACTION    ? ? ?MEDICATIONS:  ?Prior to Admission medications   ?Medication Sig Start Date End Date Taking? Authorizing Provider  ?doxycycline (VIBRA-TABS) 100 MG tablet Take 1 tablet (100 mg total) by mouth every 12 (twelve) hours. ?Patient not taking: Reported on 10/19/2021 09/20/21   Nadara MustardHarris, Robert P, MD  ?ibuprofen (ADVIL) 400 MG tablet Take 1 tablet (400 mg total) by mouth every 6 (six) hours as needed for moderate pain. ?Patient not taking: Reported on 10/19/2021 09/20/21   Nadara MustardHarris, Robert P, MD  ? ? ?Physical Exam  ? ?Triage Vital Signs: ?ED Triage Vitals  ?Enc Vitals  Group  ?   BP 02/09/22 2136 120/79  ?   Pulse Rate 02/09/22 2136 99  ?   Resp 02/09/22 2136 18  ?   Temp 02/09/22 2136 99.3 ?F (37.4 ?C)  ?   Temp Source 02/09/22 2136 Oral  ?   SpO2 02/09/22 2136 100 %  ?   Weight 02/09/22 2136 120 lb (54.4 kg)  ?   Height 02/09/22 2136 4\' 11"  (1.499 m)  ?   Head Circumference --   ?   Peak Flow --   ?   Pain Score 02/09/22 2135 4  ?   Pain Loc --   ?   Pain Edu? --   ?   Excl. in GC? --   ? ? ?Most recent vital signs: ?Vitals:  ? 02/09/22 2136 02/10/22 0130  ?BP: 120/79 111/70  ?Pulse: 99 91  ?Resp: 18 18  ?Temp: 99.3 ?F (37.4 ?C)   ?SpO2: 100% 98%  ? ? ?CONSTITUTIONAL: Alert and oriented and responds appropriately to questions. Well-appearing; well-nourished ?HEAD: Normocephalic, atraumatic ?EYES: Conjunctivae clear, pupils appear equal, sclera nonicteric ?ENT: normal nose; moist mucous membranes; No pharyngeal erythema or petechiae, no tonsillar hypertrophy or exudate, no uvular deviation, no unilateral swelling in posterior oropharynx, no trismus or drooling, no muffled voice, normal phonation, no stridor, airway patent.  TMs are clear bilaterally without erythema, purulence, bulging, perforation, effusion.  No cerumen impaction or sign of  foreign body in the external auditory canal. No inflammation, erythema or drainage from the external auditory canal. No signs of mastoiditis. No pain with manipulation of the pinna bilaterally.  ?NECK: Supple, normal ROM, tender to palpation over the paraspinal muscles cervical spine bilaterally without redness, warmth, soft tissue swelling, ecchymosis, rash or other lesions.  No midline spinal tenderness, step-off or deformity.  No meningismus.  No cervical lymphadenopathy.  Trachea midline.  No thyromegaly. ?CARD: RRR; S1 and S2 appreciated; no murmurs, no clicks, no rubs, no gallops ?RESP: Normal chest excursion without splinting or tachypnea; breath sounds clear and equal bilaterally; no wheezes, no rhonchi, no rales, no hypoxia or  respiratory distress, speaking full sentences ?ABD/GI: Normal bowel sounds; non-distended; soft, non-tender, no rebound, no guarding, no peritoneal signs ?BACK: The back appears normal ?EXT: Normal ROM in all joints; no deformity noted, no edema; no cyanosis ?SKIN: Normal color for age and race; warm; no rash on exposed skin ?NEURO: Moves all extremities equally, normal speech, normal sensation diffusely, no facial asymmetry, normal gait ?PSYCH: The patient's mood and manner are appropriate. ? ? ?ED Results / Procedures / Treatments  ? ?LABS: ?(all labs ordered are listed, but only abnormal results are displayed) ?Labs Reviewed  ?COMPREHENSIVE METABOLIC PANEL - Abnormal; Notable for the following components:  ?    Result Value  ? Potassium 3.3 (*)   ? Chloride 113 (*)   ? CO2 19 (*)   ? Glucose, Bld 110 (*)   ? Calcium 8.1 (*)   ? Total Protein 6.2 (*)   ? AST 12 (*)   ? Total Bilirubin 0.2 (*)   ? All other components within normal limits  ?URINALYSIS, ROUTINE W REFLEX MICROSCOPIC - Abnormal; Notable for the following components:  ? Color, Urine COLORLESS (*)   ? APPearance CLEAR (*)   ? Specific Gravity, Urine 1.003 (*)   ? Hgb urine dipstick MODERATE (*)   ? Bacteria, UA RARE (*)   ? All other components within normal limits  ?HCG, QUANTITATIVE, PREGNANCY - Abnormal; Notable for the following components:  ? hCG, Beta Chain, Quant, S 45 (*)   ? All other components within normal limits  ?GROUP A STREP BY PCR  ?RESP PANEL BY RT-PCR (FLU A&B, COVID) ARPGX2  ?CBC WITH DIFFERENTIAL/PLATELET  ?ABO/RH  ? ? ? ?EKG: ? ? ?RADIOLOGY: ?My personal review and interpretation of imaging: CT venogram shows no acute abnormality.  Pelvic ultrasound shows no intrauterine pregnancy, ectopic.  Blood flow noted to both ovaries. ? ?I have personally reviewed all radiology reports.   ?CT VENOGRAM HEAD ? ?Result Date: 02/10/2022 ?CLINICAL DATA:  Chronic headache with new features or increased frequency. Headache for 2 weeks with fever and  sore throat. Pregnancy. EXAM: CT VENOGRAM HEAD TECHNIQUE: Venographic phase images of the brain were obtained following the administration of intravenous contrast. Multiplanar reformats and maximum intensity projections were generated. RADIATION DOSE REDUCTION: This exam was performed according to the departmental dose-optimization program which includes automated exposure control, adjustment of the mA and/or kV according to patient size and/or use of iterative reconstruction technique. CONTRAST:  34mL OMNIPAQUE IOHEXOL 350 MG/ML SOLN COMPARISON:  04/25/2014 FINDINGS: Normal appearance of the brain. No infarct, hemorrhage, hydrocephalus, mass, or collection. Veno graphic phase shows widely patent dural sinuses. No evidence of deep or cortical vein thrombus. Symmetric enhancement of major arterial structures. Covered paranasal sinuses are clear.  Negative covered orbits. IMPRESSION: Normal CT venogram. Electronically Signed   By: Audry Riles.D.  On: 02/10/2022 04:58  ? ?US OB LESS THAN 14 WEEKS W/ OB TRANSVAGINAL AND DOPPLER ? ?Result Date: 02/10/2022 ?CLINICAL DATA:  Spotting for a few hours. Quantitative beta HCG of 45 EXAM: OBSTETRIC <14 WK [place] AND TRANSVAGINAL OB US DOPPLER ULTRASOUND OF OVARIES TECHNIQUE: Both transabdominal and transvaginal ultrasound examinations were performed for complete evaluation of the gestation as well as the maternal uterus, adnexal regions, and pelvic cul-de-sac. Transvaginal technique was performed to assess early pregnancy. Color and duplex Doppler ultrasound was utilized to evaluate blood flow to the ovaries. COMPARISON:  08/17/2021 FINDINGS: No intra or extra uterine gestational sac is seen. Negative for adnexal mass. Symmetric normal appearance of the ovaries with normal arterial and venous waveforms. No pelvic fluid. Differential considerations include intrauterine gestation too early to be sonographically visualized, spontaneous abortion, or ectopic pregnancy. IMPRESSION:  Pregnancy of unknown location with normal pelvic ultrasound. Consider follow-up ultrasound in 10 days and serial quantitative beta HCG follow-up. Electronically Signed   By: Tiburcio Pea M.D.   On: 04/23/2

## 2022-02-10 NOTE — Discharge Instructions (Signed)
Please follow-up with your OB/GYN in 48 hours to have your hCG rechecked.  Your hCG here was low at 45.  Ultrasound did not show a pregnancy but this could be because it is too early or could be due to a pregnancy outside of the uterus called an ectopic pregnancy or could be an early miscarriage.  If you develop severe abdominal pain, vaginal bleeding where you are soaking through more than a pad an hour for more than 2 straight hours, feel dizzy, have chest pain or shortness of breath, please return to the emergency department. ? ?Your blood work and urine were reassuring today.  CT of your head showed no acute abnormality.  You may continue Tylenol 1000 mg every 6 hours as needed for pain. ? ?Your strep, COVID and flu swabs were negative.  You do not need any antibiotics today. ?

## 2022-02-12 ENCOUNTER — Other Ambulatory Visit: Payer: Medicaid Other

## 2022-02-12 DIAGNOSIS — O3680X0 Pregnancy with inconclusive fetal viability, not applicable or unspecified: Secondary | ICD-10-CM

## 2022-02-13 ENCOUNTER — Telehealth: Payer: Self-pay | Admitting: *Deleted

## 2022-02-13 LAB — BETA HCG QUANT (REF LAB): hCG Quant: 40 m[IU]/mL

## 2022-02-13 NOTE — Telephone Encounter (Signed)
Pt informed of results and recommendations to repeat HCG, ectopic precautions given.  ?

## 2022-02-13 NOTE — Telephone Encounter (Signed)
-----   Message from Tereso Newcomer, MD sent at 02/13/2022  6:42 AM EDT ----- ?HCG on 4/25 is 40, was 45 on 4/23.  This is plateaued but was drawn from two different labs. Please recheck lab in 48-72 hours (4/27 or 4/28). Please give strict ectopic precautions. ?

## 2022-02-14 ENCOUNTER — Ambulatory Visit: Payer: Medicaid Other

## 2022-02-14 ENCOUNTER — Other Ambulatory Visit: Payer: Medicaid Other

## 2022-02-14 DIAGNOSIS — E349 Endocrine disorder, unspecified: Secondary | ICD-10-CM

## 2022-02-14 NOTE — Initial Assessments (Deleted)
New OB Intake  I connected with  Stefanie Sparks on 02/14/22 at  9:15 AM EDT by {Contact:[id number]} Telephone Visit and verified that I am speaking with the correct person using two identifiers. Nurse is located at [facility] and pt is located at ***.  I explained I am completing New OB Intake today. We discussed her EDD of *** that is based on LMP of ***. Pt is G***/P***. I reviewed her allergies, medications, Medical/Surgical/OB history, and appropriate screenings. I informed her of [facility] services. Based on history, this is a/an  pregnancy {Complicated/Uncomplicated Pregnancy:20185} .   Patient Active Problem List   Diagnosis Date Noted   Missed abortion 09/20/2021   Normal labor and delivery 12/16/2020   Vaginal spotting 12/04/2020   Preterm labor in third trimester 12/02/2020   [redacted] weeks gestation of pregnancy 12/02/2020   Labor and delivery, indication for care 11/24/2020   Pregnancy related bilateral lower abdominal pain, antepartum 11/09/2020   COVID-19 affecting pregnancy in third trimester    Pregnancy 10/19/2020   Pelvic pain affecting pregnancy in third trimester, antepartum 10/19/2020   Encounter for supervision of other normal pregnancy, third trimester 05/07/2020   Tobacco use disorder 05/07/2020   Chronic pelvic pain in female 10/02/2018   Migraine 06/17/2011    Concerns addressed today  Delivery Plans:  Plans to deliver at [facility].    [place] Explained first scheduled [place] will be scheduled at her first visit with provider. Labs Pt aware NOB labs will be drawn at first visit with provider.  Patient {HAS/HAS NOT:20194} covid vaccine.    Social Determinants of Health Food Insecurity: {food:25541}  Transportation: {transportation:25542}  Placed OB Box on problem list and updated  First visit review I reviewed new OB appt with pt. I explained she will have a pelvic exam, ob bloodwork with genetic screening, and PAP smear. Explained pt will  be seen by *** at first visit; encounter routed to appropriate provider.   Loran Senters, CMA(AAMA) 02/14/2022  9:10 AM

## 2022-02-15 ENCOUNTER — Telehealth: Payer: Self-pay | Admitting: *Deleted

## 2022-02-15 LAB — BETA HCG QUANT (REF LAB): hCG Quant: 34 m[IU]/mL

## 2022-02-15 NOTE — Telephone Encounter (Signed)
Pt called to discuss her results. Denies any abnormal pain other than cramping and bleeding like she is on her period. Discussed ectopic precautions again and that we will check her HCG weekly. Pt verbalizes and understands ?

## 2022-02-15 NOTE — Telephone Encounter (Signed)
-----   Message from Tereso Newcomer, MD sent at 02/15/2022  9:51 AM EDT ----- ?Decreasing HCG values.  Repeat HCG check next week, will need weekly HCGs until negative.  Continue with ectopic precautions. ?

## 2022-02-18 ENCOUNTER — Other Ambulatory Visit: Payer: Self-pay | Admitting: *Deleted

## 2022-02-18 DIAGNOSIS — O021 Missed abortion: Secondary | ICD-10-CM

## 2022-02-21 ENCOUNTER — Other Ambulatory Visit: Payer: Medicaid Other

## 2022-02-21 DIAGNOSIS — O021 Missed abortion: Secondary | ICD-10-CM

## 2022-02-22 ENCOUNTER — Telehealth: Payer: Self-pay

## 2022-02-22 ENCOUNTER — Ambulatory Visit: Payer: Medicaid Other

## 2022-02-22 LAB — BETA HCG QUANT (REF LAB): hCG Quant: 10 m[IU]/mL

## 2022-02-22 NOTE — Telephone Encounter (Signed)
TC to pt to make aware of HCG results ?Pt saw results in Mychart as well  ?Pt advised to have lab repeated next week until negative  ?Pt wants to R/S missed Nurse visit from this morning to next week  ?Front desk made aware to get pt scheduled.  ? ?

## 2022-02-25 ENCOUNTER — Encounter: Payer: Medicaid Other | Admitting: Obstetrics

## 2022-02-28 ENCOUNTER — Ambulatory Visit (INDEPENDENT_AMBULATORY_CARE_PROVIDER_SITE_OTHER): Payer: Medicaid Other | Admitting: *Deleted

## 2022-02-28 ENCOUNTER — Encounter: Payer: Medicaid Other | Admitting: Obstetrics & Gynecology

## 2022-02-28 ENCOUNTER — Other Ambulatory Visit (HOSPITAL_COMMUNITY)
Admission: RE | Admit: 2022-02-28 | Discharge: 2022-02-28 | Disposition: A | Discharge status: Home / Self Care | Payer: Medicaid Other | Source: Ambulatory Visit | Attending: Obstetrics & Gynecology | Admitting: Obstetrics & Gynecology

## 2022-02-28 VITALS — BP 110/79 | HR 79

## 2022-02-28 DIAGNOSIS — O021 Missed abortion: Secondary | ICD-10-CM

## 2022-02-28 DIAGNOSIS — B9689 Other specified bacterial agents as the cause of diseases classified elsewhere: Secondary | ICD-10-CM

## 2022-02-28 DIAGNOSIS — N898 Other specified noninflammatory disorders of vagina: Secondary | ICD-10-CM

## 2022-02-28 DIAGNOSIS — Z113 Encounter for screening for infections with a predominantly sexual mode of transmission: Secondary | ICD-10-CM

## 2022-02-28 NOTE — Progress Notes (Signed)
SUBJECTIVE:  ?26 y.o. female complains of malodorous vaginal discharge since miscarriage. Marland Kitchen ?Denies abnormal vaginal bleeding or significant pelvic pain or ?fever. No UTI symptoms. Denies history of known exposure to STD. ? ?Patient's last menstrual period was 12/28/2021 (exact date). ? ?OBJECTIVE:  ?She appears well, afebrile. ?Urine dipstick: not done. ? ?ASSESSMENT:  ?Vaginal Discharge  ?Vaginal Odor ? ? ?PLAN:  ?GC, chlamydia, trichomonas, BVAG, CVAG probe sent to lab. ?Treatment: To be determined once lab results are received ?ROV prn if symptoms persist or worsen.  ?

## 2022-03-01 LAB — BETA HCG QUANT (REF LAB): hCG Quant: 9 m[IU]/mL

## 2022-03-01 LAB — CERVICOVAGINAL ANCILLARY ONLY
Bacterial Vaginitis (gardnerella): POSITIVE — AB
Candida Glabrata: NEGATIVE
Candida Vaginitis: NEGATIVE
Chlamydia: NEGATIVE
Comment: NEGATIVE
Comment: NEGATIVE
Comment: NEGATIVE
Comment: NEGATIVE
Comment: NEGATIVE
Comment: NORMAL
Neisseria Gonorrhea: NEGATIVE
Trichomonas: NEGATIVE

## 2022-03-04 ENCOUNTER — Telehealth: Payer: Self-pay

## 2022-03-04 MED ORDER — METRONIDAZOLE 500 MG PO TABS: 500.0000 mg | ORAL_TABLET | Freq: Two times a day (BID) | ORAL | 0 refills | Status: AC

## 2022-03-04 NOTE — Telephone Encounter (Signed)
-----   Message from Tereso Newcomer, MD sent at 03/01/2022 12:11 PM EDT ----- ?Decreasing but plateauing HCG values consistent with failed pregnancy.  Repeat HCG check next week, will need weekly HCGs until negative.  Continue with ectopic precautions. ?

## 2022-03-04 NOTE — Telephone Encounter (Signed)
TC to pt regarding results and need to schedule repeat HCG lab appt until results are negative.  ?Pt not ava LVM  ?

## 2022-03-04 NOTE — Addendum Note (Signed)
Addended by: Jaynie Collins A on: 03/04/2022 08:21 AM ? ? Modules accepted: Orders ? ?

## 2022-03-06 ENCOUNTER — Other Ambulatory Visit: Payer: Self-pay | Admitting: *Deleted

## 2022-03-06 ENCOUNTER — Other Ambulatory Visit: Payer: Medicaid Other

## 2022-03-06 DIAGNOSIS — O021 Missed abortion: Secondary | ICD-10-CM

## 2022-03-07 LAB — BETA HCG QUANT (REF LAB): hCG Quant: 7 m[IU]/mL

## 2022-03-13 ENCOUNTER — Other Ambulatory Visit: Payer: Medicaid Other

## 2022-03-13 DIAGNOSIS — O021 Missed abortion: Secondary | ICD-10-CM

## 2022-03-14 ENCOUNTER — Encounter: Payer: Self-pay | Admitting: Obstetrics & Gynecology

## 2022-03-14 DIAGNOSIS — N870 Mild cervical dysplasia: Secondary | ICD-10-CM | POA: Insufficient documentation

## 2022-03-14 LAB — BETA HCG QUANT (REF LAB): hCG Quant: 1 m[IU]/mL

## 2022-03-16 IMAGING — US US OB < 14 WEEKS - US OB TV
1 series · 13 of 28 positions shown · non-contrast
Comparison: None this pregnancy.

CLINICAL DATA: Vaginal bleeding in first trimester pregnancy. Beta
HCG 268. Patient has unknown LMP.

EXAM:
OBSTETRIC <14 WK US AND TRANSVAGINAL OB US
TECHNIQUE: Both transabdominal and transvaginal ultrasound examinations were
performed for complete evaluation of the gestation as well as the
maternal uterus, adnexal regions, and pelvic cul-de-sac.
Transvaginal technique was performed to assess early pregnancy.

[Series 1: us ob less than 14 weeks with ob transvaginal · 13 of 115 slices shown]
[im 5/115]
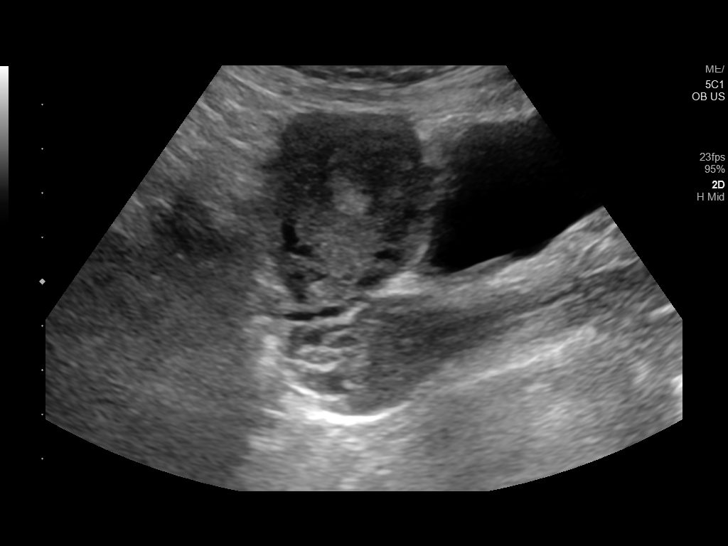
[im 13/115]
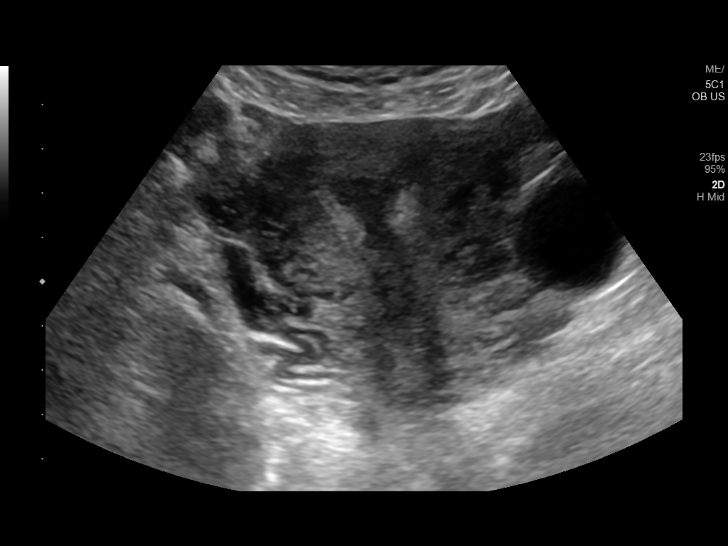
[im 22/115]
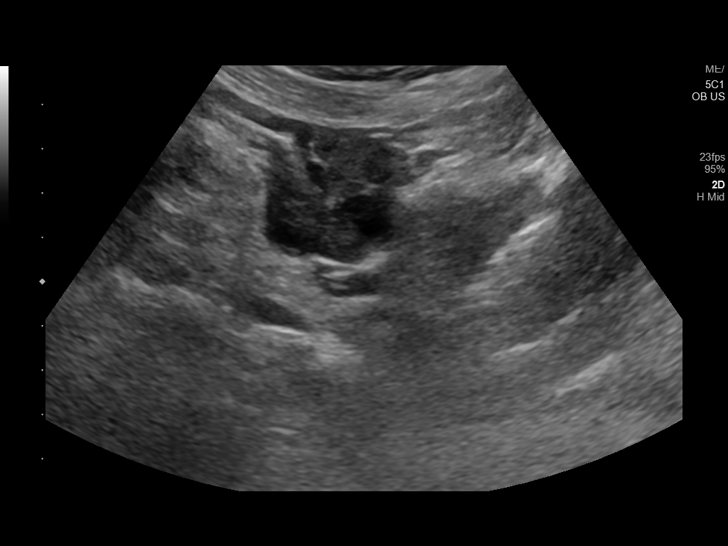
[im 30/115]
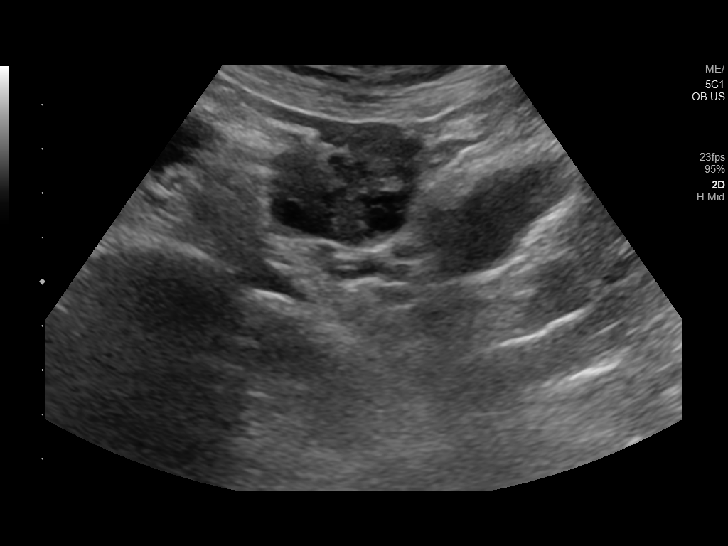
[im 39/115]
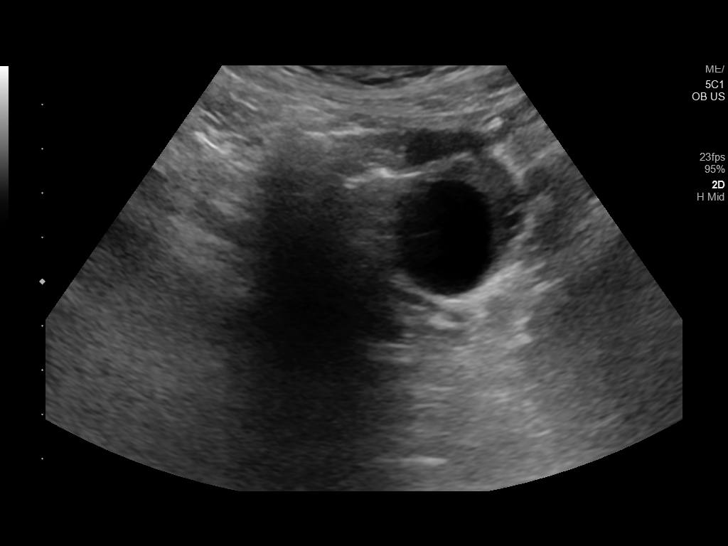
[im 47/115]
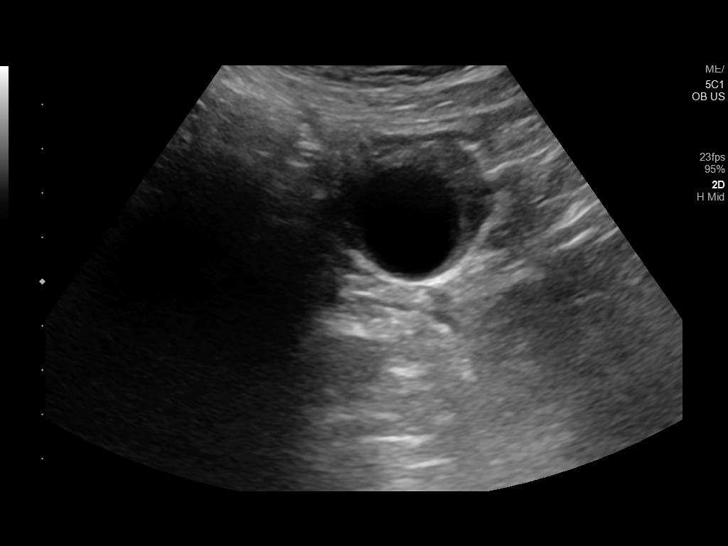
[im 60/115]
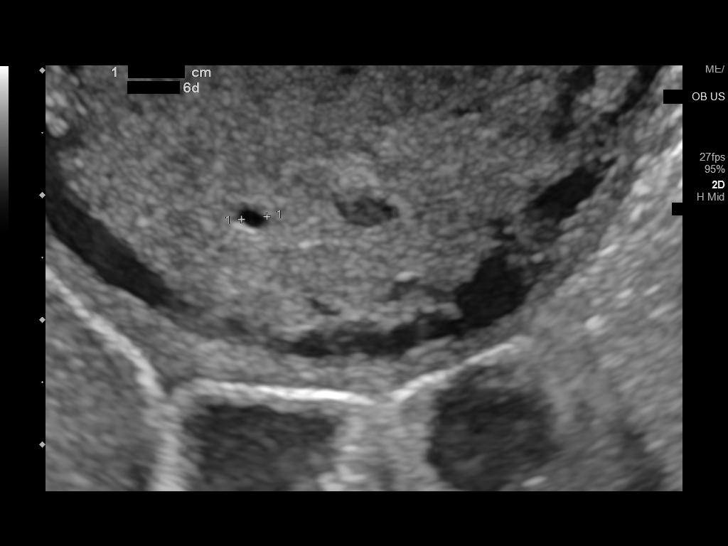
[im 68/115]
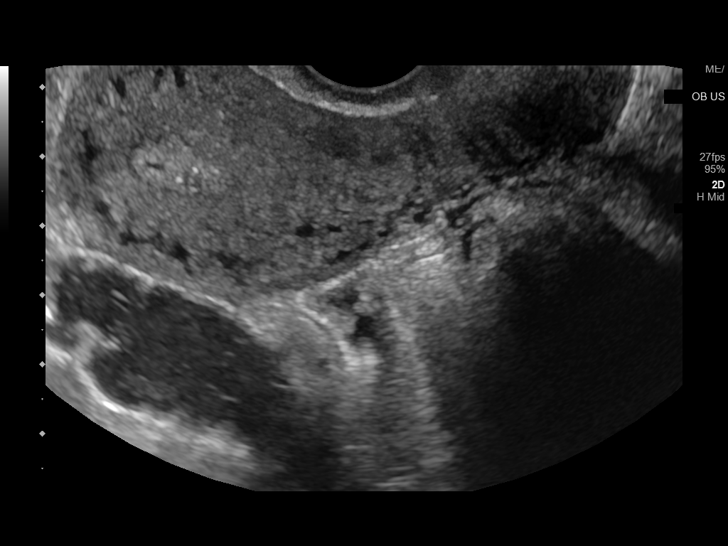
[im 77/115]
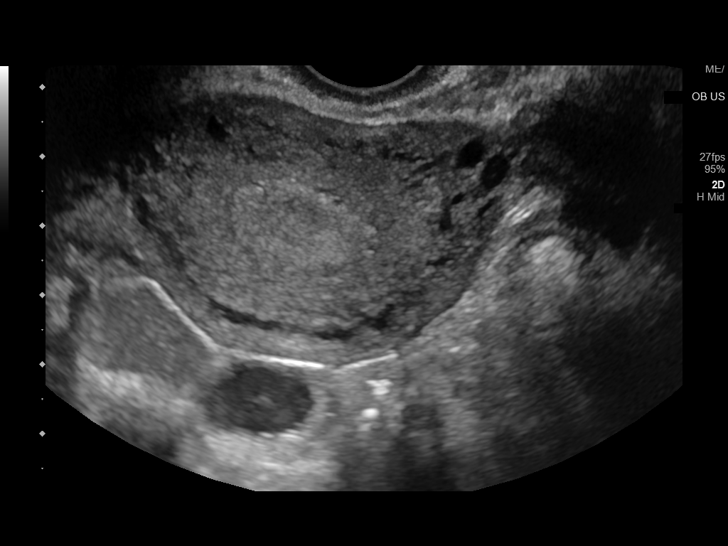
[im 85/115]
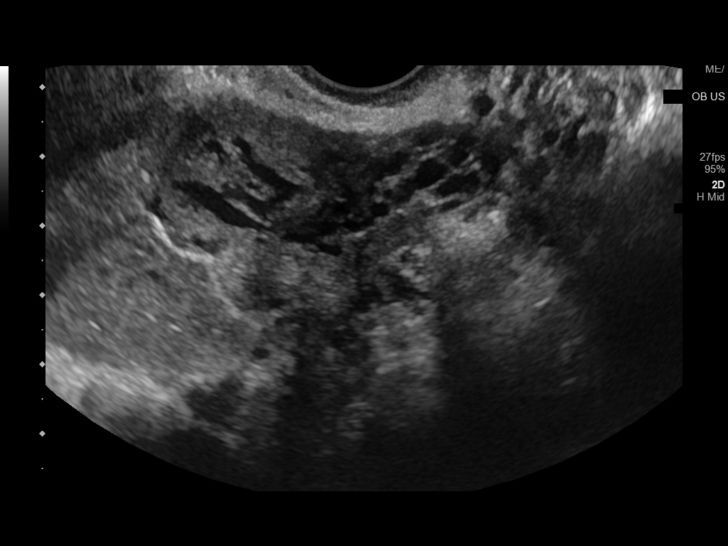
[im 93/115]
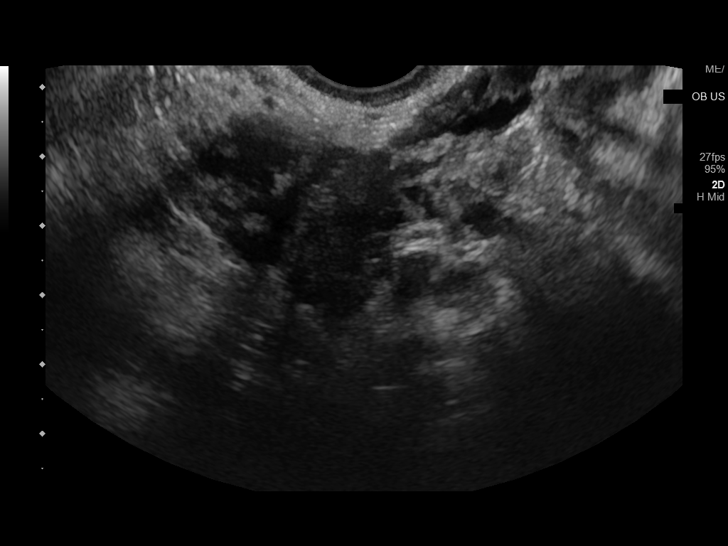
[im 102/115]
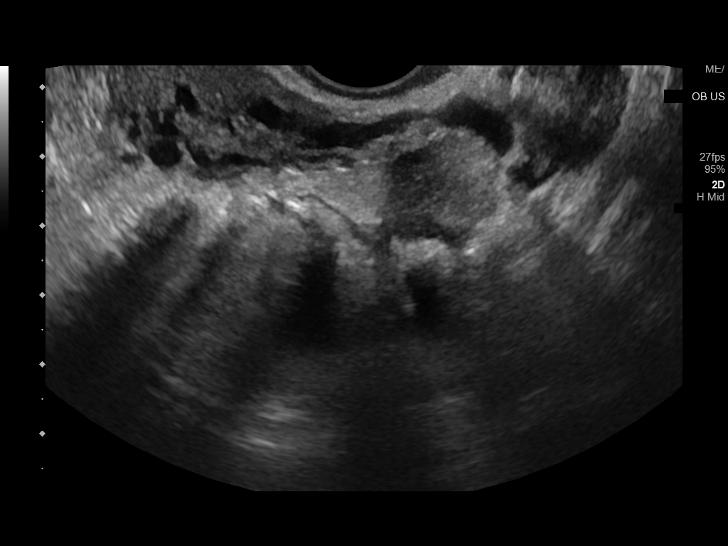
[im 110/115]
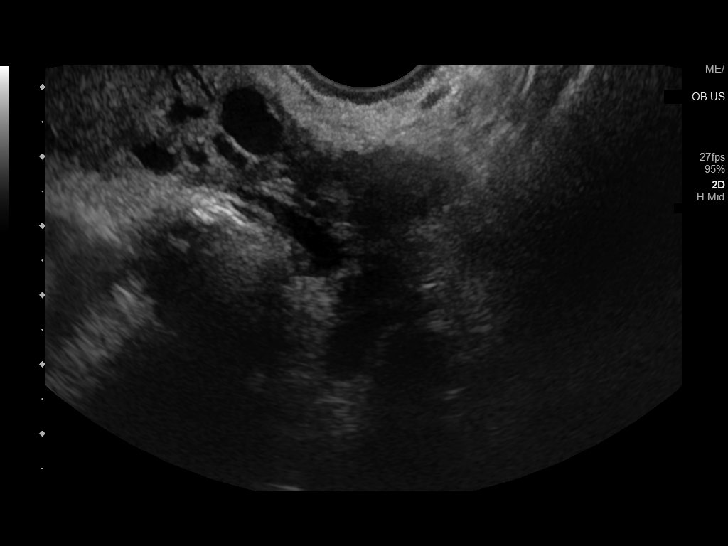

[13 of 28 positions shown; findings below may reference images not displayed]

FINDINGS: Intrauterine gestational sac: Single

Yolk sac:  Not Visualized.

Embryo:  Not Visualized.

Cardiac Activity: Not Visualized.

MSD: 2.3 mm   4 w   6 d

Subchorionic hemorrhage:  Present measuring 4 x 5 x 10 mm.

Maternal uterus/adnexae: Probable early gestational sac with
adjacent subchorionic hemorrhage. The right ovary is normal
measuring 2.3 x 1.6 x 1.8 cm. Ovarian blood flow is seen. The left
ovary measures 3.8 x 3.3 x 2.6 cm and contains a corpus luteal cyst.
Ovarian blood flow is seen. No pelvic free fluid or adnexal mass.
IMPRESSION: 1. Probable early intrauterine gestational sac, but no yolk sac,
fetal pole, or cardiac activity yet visualized. Recommend follow-up
quantitative B-HCG levels and follow-up US in 14 days to assess
viability. This recommendation follows SRU consensus guidelines:
Diagnostic Criteria for Nonviable Pregnancy Early in the First
Trimester. N Engl J Med 8208; [DATE].
2. Small subchorionic hemorrhage.

## 2022-03-28 ENCOUNTER — Encounter: Payer: Self-pay | Admitting: Obstetrics and Gynecology

## 2022-04-02 ENCOUNTER — Encounter: Payer: Self-pay | Admitting: Obstetrics and Gynecology

## 2022-04-02 ENCOUNTER — Ambulatory Visit: Payer: Medicaid Other | Admitting: Obstetrics and Gynecology

## 2022-04-02 NOTE — Progress Notes (Unsigned)
cyto

## 2022-04-25 ENCOUNTER — Encounter: Payer: Self-pay | Admitting: Obstetrics and Gynecology

## 2022-05-08 ENCOUNTER — Ambulatory Visit: Payer: Medicaid Other | Admitting: Obstetrics and Gynecology

## 2022-05-08 ENCOUNTER — Ambulatory Visit (INDEPENDENT_AMBULATORY_CARE_PROVIDER_SITE_OTHER): Payer: Medicaid Other | Admitting: *Deleted

## 2022-05-08 ENCOUNTER — Ambulatory Visit (INDEPENDENT_AMBULATORY_CARE_PROVIDER_SITE_OTHER): Payer: Medicaid Other

## 2022-05-08 VITALS — BP 94/65 | HR 55 | Wt 124.0 lb

## 2022-05-08 DIAGNOSIS — Z3480 Encounter for supervision of other normal pregnancy, unspecified trimester: Secondary | ICD-10-CM

## 2022-05-08 DIAGNOSIS — O3680X0 Pregnancy with inconclusive fetal viability, not applicable or unspecified: Secondary | ICD-10-CM | POA: Diagnosis not present

## 2022-05-08 DIAGNOSIS — Z3A01 Less than 8 weeks gestation of pregnancy: Secondary | ICD-10-CM | POA: Diagnosis not present

## 2022-05-08 DIAGNOSIS — O09299 Supervision of pregnancy with other poor reproductive or obstetric history, unspecified trimester: Secondary | ICD-10-CM | POA: Insufficient documentation

## 2022-05-08 NOTE — Progress Notes (Cosign Needed)
New OB Intake  I explained I am completing New OB Intake today. We discussed her EDD of 12/28/22 that is based on today's scan. LMP of 03/10/22 and possibly 03/28/22. Pt is G6/P3. I reviewed her allergies, medications, Medical/Surgical/OB history, and appropriate screenings.   Patient Active Problem List   Diagnosis Date Noted   Supervision of other normal pregnancy, antepartum 05/08/2022   History of miscarriage, currently pregnant 05/08/2022   Tobacco use disorder 05/07/2020    Concerns addressed today  Delivery Plans:  Plans to deliver at [facility].   MyChart/Babyscripts MyChart access verified. I explained pt will have some visits in office and some virtually.   Blood Pressure Cuff  BP cuff  given  Discussed to be used for virtual visits and or if needed BP checks weekly.    Anatomy US Explained first scheduled [place] will be around 19 weeks.   Labs Discussed Avelina Laine genetic screening with patient.  Routine prenatal labs needed.   Placed OB Box on problem list and updated   Patient informed that the ultrasound is considered a limited obstetric ultrasound and is not intended to be a complete ultrasound exam.  Patient also informed that the ultrasound is not being completed with the intent of assessing for fetal or placental anomalies or any pelvic abnormalities. Explained that the purpose of today's ultrasound is to assess for dating and fetal heart rate.  Patient acknowledges the purpose of the exam and the limitations of the study.      First visit review I reviewed new OB appt with pt. I explained she will have a pelvic exam, ob bloodwork with genetic screening, and PAP smear. Explained pt will be seen by Dr Vergie Living at first visit.    Scheryl Marten, RN 05/08/2022  10:01 AM

## 2022-05-13 ENCOUNTER — Encounter: Payer: Self-pay | Admitting: Obstetrics and Gynecology

## 2022-05-14 ENCOUNTER — Other Ambulatory Visit: Payer: Self-pay | Admitting: *Deleted

## 2022-05-14 MED ORDER — ONDANSETRON 4 MG PO TBDP: 4.0000 mg | ORAL_TABLET | Freq: Four times a day (QID) | ORAL | 2 refills | Status: DC | PRN

## 2022-05-14 NOTE — Progress Notes (Signed)
Okay per dr Shawnie Pons to send in zofran

## 2022-06-11 ENCOUNTER — Encounter: Payer: Self-pay | Admitting: Obstetrics and Gynecology

## 2022-06-11 ENCOUNTER — Other Ambulatory Visit (HOSPITAL_COMMUNITY)
Admission: RE | Admit: 2022-06-11 | Discharge: 2022-06-11 | Disposition: A | Discharge status: Home / Self Care | Payer: Medicaid Other | Source: Ambulatory Visit | Attending: Obstetrics and Gynecology | Admitting: Obstetrics and Gynecology

## 2022-06-11 ENCOUNTER — Encounter: Payer: Medicaid Other | Admitting: Obstetrics and Gynecology

## 2022-06-11 ENCOUNTER — Ambulatory Visit (INDEPENDENT_AMBULATORY_CARE_PROVIDER_SITE_OTHER): Payer: Medicaid Other | Admitting: Obstetrics and Gynecology

## 2022-06-11 VITALS — BP 96/63 | HR 76 | Wt 127.0 lb

## 2022-06-11 DIAGNOSIS — Z3481 Encounter for supervision of other normal pregnancy, first trimester: Secondary | ICD-10-CM | POA: Diagnosis not present

## 2022-06-11 DIAGNOSIS — Z3480 Encounter for supervision of other normal pregnancy, unspecified trimester: Secondary | ICD-10-CM | POA: Insufficient documentation

## 2022-06-11 DIAGNOSIS — Z3A11 11 weeks gestation of pregnancy: Secondary | ICD-10-CM | POA: Diagnosis not present

## 2022-06-11 NOTE — Progress Notes (Signed)
New OB Note  06/11/2022   Clinic: [facility]   Chief Complaint: new OB  Transfer of Care Patient: no  History of Present Illness: Stefanie Sparks is a 26 y.o. [id number] @ 11/3 weeks (EDC 3/9, based on 6wk u/s) Patient's last menstrual period was 03/10/2022.  Preg complicated by has Tobacco use disorder; Dysplasia of cervix, low grade (CIN 1); Supervision of other normal pregnancy, antepartum; and History of miscarriage, currently pregnant on their problem list.   Irregular bleeding, concern for miscarriage  ROS: A 12-point review of systems was performed and negative, except as stated in the above HPI.  OBGYN History: As per HPI. OB History  Gravida Para Term Preterm AB Living  6 3 3  0 2 3  SAB IAB Ectopic Multiple Live Births  2 0 0 0 3    # Outcome Date GA Lbr Len/2nd Weight Sex Delivery Anes PTL Lv  6 Current           5 SAB 01/2022 [redacted]w[redacted]d         4 SAB 07/2021 [redacted]w[redacted]d         3 Term 12/16/20 [redacted]w[redacted]d / 00:13 7 lb 14.3 oz (3.58 kg) F Vag-Spont EPI  LIV  2 Term 04/24/16 [redacted]w[redacted]d / 00:53 7 lb 1.6 oz (3.22 kg) M Vag-Spont EPI  LIV  1 Term 11/03/10 [redacted]w[redacted]d  6 lb 9 oz (2.977 kg) F Vag-Spont None  LIV   Any issues with any prior pregnancies: no Prior children are healthy, doing well, and without any problems or issues: yes History of pap smears: Yes. Last pap smear 02/2021 LSIL and 03/2021 colpo with cin 1 biopsy and negative ecc.  Past Medical History: Past Medical History:  Diagnosis Date   Chronic pelvic pain in female 10/02/2018   Endometriosis    Gastroenteritis 04/06/2016   History of colposcopy    Migraine    Vaginal Pap smear, abnormal     Past Surgical History: Past Surgical History:  Procedure Laterality Date   DILATION AND EVACUATION N/A 09/20/2021   Procedure: DILATATION AND EVACUATION;  Surgeon: 14/10/2020, MD;  Location: [facility] ORS;  Service: Gynecology;  Laterality: N/A;   WISDOM TOOTH EXTRACTION      Family History:  Family History   Problem Relation Age of Onset   Endometriosis Maternal Grandmother    Diabetes Maternal Grandmother    Endometriosis Mother    Bipolar disorder Father    Asthma Brother    Bipolar disorder Brother    Asthma Son    Seizures Son    Breast cancer Maternal Great-grandmother     Social History:  Social History   Socioeconomic History   Marital status: Single    Spouse name: Demitri   Number of children: 2   Years of education: Not on file   Highest education level: Not on file  Occupational History   Not on file  Tobacco Use   Smoking status: Some Days    Packs/day: 0.25    Years: 9.00    Total pack years: 2.25    Types: Cigarettes   Smokeless tobacco: Never  Vaping Use   Vaping Use: Former   Substances: Flavoring  Substance and Sexual Activity   Alcohol use: Not Currently    Comment: rare   Drug use: Not Currently    Types: Marijuana    Comment: Denies any Marijuana use since October 2020   Sexual activity: Yes    Partners: Male    Birth  control/protection: None  Other Topics Concern   Not on file  Social History Narrative   Not on file   Social Determinants of Health   Financial Resource Strain: Not on file  Food Insecurity: Not on file  Transportation Needs: Not on file  Physical Activity: Not on file  Stress: Not on file  Social Connections: Not on file  Intimate Partner Violence: Not At Risk (04/18/2020)   Humiliation, Afraid, Rape, and Kick questionnaire    Fear of Current or Ex-Partner: No    Emotionally Abused: No    Physically Abused: No    Sexually Abused: No     Allergy: Allergies  Allergen Reactions   Other Other (See Comments)    oranges Uncoded Allergy. Allergen: Shellfish, Other Reaction: Other reaction    Current Outpatient Medications: Zofran ODT PRN Prenatal vitamin  Physical Exam:   BP 96/63   Pulse 76   Wt 127 lb (57.6 kg)   LMP 03/10/2022   BMI 25.65 kg/m  Body mass index is 25.65 kg/m. Contractions: Not  present Vag. Bleeding: None. FHTs: 160s  General appearance: Well nourished, well developed female in no acute distress.  Neck:  Supple, normal appearance, and no thyromegaly  Cardiovascular: S1, S2 normal, no murmur, rub or gallop, regular rate and rhythm Respiratory:  Clear to auscultation bilateral. Normal respiratory effort Abdomen: positive bowel sounds and no masses, hernias; diffusely non tender to palpation, non distended Neuro/Psych:  Normal mood and affect.  Skin:  Warm and dry.  Lymphatic:  No inguinal lymphadenopathy.   Pelvic exam: is not limited by body habitus EGBUS: within normal limits, Vagina: within normal limits and with no blood in the vault, Cervix: normal appearing cervix without discharge or lesions, closed/long/high, Uterus:  nonenlarged, and Adnexa:  normal adnexa and no mass, fullness, tenderness  Laboratory: deferred  Imaging:  No new imaging  Assessment: pt doing well  Plan: 1. Supervision of other normal pregnancy, antepartum Offer afp next visit. Desires panorama only - CBC/D/Plt+RPR+Rh+ABO+RubIgG... - Culture, OB Urine - Cytology - PAP - [place] MFM OB COMP + 14 WK; Future  Problem list reviewed and updated.  Follow up in 4 weeks.  >50% of 35 min visit spent on counseling and coordination of care.     Cornelia Copa MD Attending [facility])

## 2022-06-12 LAB — CBC/D/PLT+RPR+RH+ABO+RUBIGG...
Antibody Screen: NEGATIVE
Basophils Absolute: 0 10*3/uL (ref 0.0–0.2)
Basos: 0 %
EOS (ABSOLUTE): 0.1 10*3/uL (ref 0.0–0.4)
Eos: 1 %
HCV Ab: NONREACTIVE
HIV Screen 4th Generation wRfx: NONREACTIVE
Hematocrit: 38.1 % (ref 34.0–46.6)
Hemoglobin: 12.7 g/dL (ref 11.1–15.9)
Hepatitis B Surface Ag: NEGATIVE
Immature Grans (Abs): 0 10*3/uL (ref 0.0–0.1)
Immature Granulocytes: 0 %
Lymphocytes Absolute: 1.8 10*3/uL (ref 0.7–3.1)
Lymphs: 30 %
MCH: 29 pg (ref 26.6–33.0)
MCHC: 33.3 g/dL (ref 31.5–35.7)
MCV: 87 fL (ref 79–97)
Monocytes Absolute: 0.5 10*3/uL (ref 0.1–0.9)
Monocytes: 9 %
Neutrophils Absolute: 3.7 10*3/uL (ref 1.4–7.0)
Neutrophils: 60 %
Platelets: 249 10*3/uL (ref 150–450)
RBC: 4.38 x10E6/uL (ref 3.77–5.28)
RDW: 12.6 % (ref 11.7–15.4)
RPR Ser Ql: NONREACTIVE
Rh Factor: POSITIVE
Rubella Antibodies, IGG: 2.45 index (ref >0.99)
WBC: 6.1 10*3/uL (ref 3.4–10.8)

## 2022-06-12 LAB — HCV INTERPRETATION

## 2022-06-13 LAB — CULTURE, OB URINE

## 2022-06-13 LAB — URINE CULTURE, OB REFLEX

## 2022-06-17 LAB — CYTOLOGY - PAP
Chlamydia: NEGATIVE
Comment: NEGATIVE
Comment: NORMAL
Diagnosis: NEGATIVE
Diagnosis: REACTIVE
Neisseria Gonorrhea: NEGATIVE

## 2022-06-23 LAB — PANORAMA PRENATAL TEST FULL PANEL:PANORAMA TEST PLUS 5 ADDITIONAL MICRODELETIONS

## 2022-06-25 ENCOUNTER — Other Ambulatory Visit: Payer: Medicaid Other

## 2022-06-25 ENCOUNTER — Encounter: Payer: Self-pay | Admitting: Obstetrics and Gynecology

## 2022-06-25 DIAGNOSIS — Z3401 Encounter for supervision of normal first pregnancy, first trimester: Secondary | ICD-10-CM

## 2022-07-02 LAB — PANORAMA PRENATAL TEST FULL PANEL:PANORAMA TEST PLUS 5 ADDITIONAL MICRODELETIONS: FETAL FRACTION: 13

## 2022-07-09 ENCOUNTER — Encounter: Payer: Self-pay | Admitting: Obstetrics and Gynecology

## 2022-07-09 ENCOUNTER — Ambulatory Visit (INDEPENDENT_AMBULATORY_CARE_PROVIDER_SITE_OTHER): Payer: Medicaid Other | Admitting: Obstetrics and Gynecology

## 2022-07-09 VITALS — BP 93/60 | HR 76 | Wt 128.0 lb

## 2022-07-09 DIAGNOSIS — Z3480 Encounter for supervision of other normal pregnancy, unspecified trimester: Secondary | ICD-10-CM

## 2022-07-09 DIAGNOSIS — Z3482 Encounter for supervision of other normal pregnancy, second trimester: Secondary | ICD-10-CM

## 2022-07-09 DIAGNOSIS — Z3A15 15 weeks gestation of pregnancy: Secondary | ICD-10-CM

## 2022-07-09 NOTE — Progress Notes (Signed)
   PRENATAL VISIT NOTE  Subjective:  Stefanie Sparks is a 26 y.o. [phone] at [redacted]w[redacted]d being seen today for ongoing prenatal care.  She is currently monitored for the following issues for this low-risk pregnancy and has Tobacco use disorder; Dysplasia of cervix, low grade (CIN 1); Supervision of other normal pregnancy, antepartum; and History of miscarriage, currently pregnant on their problem list.  Patient reports no complaints.  Contractions: Not present. Vag. Bleeding: None.   . Denies leaking of fluid.   The following portions of the patient's history were reviewed and updated as appropriate: allergies, current medications, past family history, past medical history, past social history, past surgical history and problem list.   Objective:   Vitals:   07/09/22 1342  BP: 93/60  Pulse: 76  Weight: 128 lb (58.1 kg)    Fetal Status: Fetal Heart Rate (bpm): 150         General:  Alert, oriented and cooperative. Patient is in no acute distress.  Skin: Skin is warm and dry. No rash noted.   Cardiovascular: Normal heart rate noted  Respiratory: Normal respiratory effort, no problems with respiration noted  Abdomen: Soft, gravid, appropriate for gestational age.  Pain/Pressure: Absent     Pelvic: Cervical exam deferred        Extremities: Normal range of motion.     Mental Status: Normal mood and affect. Normal behavior. Normal judgment and thought content.   Assessment and Plan:  Pregnancy: [id number] at [redacted]w[redacted]d 1. Supervision of other normal pregnancy, antepartum Patient is doing well without complaints She reports improvement in her nausea  AFP today Anatomy ultrasound scheduled 10/13  Preterm labor symptoms and general obstetric precautions including but not limited to vaginal bleeding, contractions, leaking of fluid and fetal movement were reviewed in detail with the patient. Please refer to After Visit Summary for other counseling recommendations.   Return in about 4 weeks (around  08/06/2022) for in person, ROB, Low risk.  Future Appointments  Date Time Provider [place]  08/02/2022  1:30 PM [facility]  08/05/2022  1:30 PM Caren Macadam, MD [facility]  09/02/2022  2:30 PM Caren Macadam, MD [facility]    Mora Bellman, MD

## 2022-07-11 LAB — AFP, SERUM, OPEN SPINA BIFIDA
AFP MoM: 1.48
AFP Value: 50.6 ng/mL
Gest. Age on Collection Date: 15.3 weeks
Maternal Age At EDD: 29.1 yr
OSBR Risk 1 IN: 2886
Test Results:: NEGATIVE
Weight: 128 [lb_av]

## 2022-07-29 ENCOUNTER — Encounter: Payer: Self-pay | Admitting: Obstetrics and Gynecology

## 2022-08-02 ENCOUNTER — Other Ambulatory Visit: Payer: Self-pay | Admitting: Obstetrics and Gynecology

## 2022-08-02 ENCOUNTER — Ambulatory Visit: Payer: Medicaid Other | Attending: Obstetrics and Gynecology

## 2022-08-02 DIAGNOSIS — Z3A18 18 weeks gestation of pregnancy: Secondary | ICD-10-CM | POA: Diagnosis not present

## 2022-08-02 DIAGNOSIS — F17200 Nicotine dependence, unspecified, uncomplicated: Secondary | ICD-10-CM | POA: Insufficient documentation

## 2022-08-02 DIAGNOSIS — Z3480 Encounter for supervision of other normal pregnancy, unspecified trimester: Secondary | ICD-10-CM

## 2022-08-02 DIAGNOSIS — O99332 Smoking (tobacco) complicating pregnancy, second trimester: Secondary | ICD-10-CM | POA: Diagnosis not present

## 2022-08-02 DIAGNOSIS — Z363 Encounter for antenatal screening for malformations: Secondary | ICD-10-CM | POA: Insufficient documentation

## 2022-08-05 ENCOUNTER — Encounter: Payer: Self-pay | Admitting: Family Medicine

## 2022-08-05 ENCOUNTER — Ambulatory Visit (INDEPENDENT_AMBULATORY_CARE_PROVIDER_SITE_OTHER): Payer: Medicaid Other | Admitting: Family Medicine

## 2022-08-05 DIAGNOSIS — Z3A19 19 weeks gestation of pregnancy: Secondary | ICD-10-CM

## 2022-08-05 DIAGNOSIS — Z3480 Encounter for supervision of other normal pregnancy, unspecified trimester: Secondary | ICD-10-CM

## 2022-08-05 DIAGNOSIS — Z3482 Encounter for supervision of other normal pregnancy, second trimester: Secondary | ICD-10-CM

## 2022-08-05 NOTE — Patient Instructions (Signed)
Safe Medications in Pregnancy    Indigestion: Tums Maalox Mylanta Zantac  Pepcid Famotidine/Cimetidine/Ranitidine

## 2022-08-05 NOTE — Progress Notes (Signed)
ROB [redacted]w[redacted]d   CC: Heartburn pt takes OTC medication no relief. Pt would like prescription.

## 2022-08-05 NOTE — Progress Notes (Signed)
   PRENATAL VISIT NOTE  Subjective:  Stefanie Sparks is a 26 y.o. [phone] at [redacted]w[redacted]d being seen today for ongoing prenatal care.  She is currently monitored for the following issues for this low-risk pregnancy and has Tobacco use disorder; Dysplasia of cervix, low grade (CIN 1); Supervision of other normal pregnancy, antepartum; and History of miscarriage, currently pregnant on their problem list.  Patient reports no complaints.  Contractions: Not present. Vag. Bleeding: None.  Movement: Present. Denies leaking of fluid.   The following portions of the patient's history were reviewed and updated as appropriate: allergies, current medications, past family history, past medical history, past social history, past surgical history and problem list.   Objective:   Vitals:   08/05/22 1343  BP: (!) 89/57  Pulse: 88  Weight: 131 lb (59.4 kg)    Fetal Status: Fetal Heart Rate (bpm): 150   Movement: Present     General:  Alert, oriented and cooperative. Patient is in no acute distress.  Skin: Skin is warm and dry. No rash noted.   Cardiovascular: Normal heart rate noted  Respiratory: Normal respiratory effort, no problems with respiration noted  Abdomen: Soft, gravid, appropriate for gestational age.  Pain/Pressure: Absent     Pelvic: Cervical exam deferred        Extremities: Normal range of motion.  Edema: None  Mental Status: Normal mood and affect. Normal behavior. Normal judgment and thought content.   Assessment and Plan:  Pregnancy: [id number] at [redacted]w[redacted]d  1. Supervision of other normal pregnancy, antepartum Up to date  [place] was WNL Has heart burn- has not tried H2 blockers. Will try. Continue tums  Preterm labor symptoms and general obstetric precautions including but not limited to vaginal bleeding, contractions, leaking of fluid and fetal movement were reviewed in detail with the patient. Please refer to After Visit Summary for other counseling recommendations.   Return in about 4  weeks (around 09/02/2022) for Routine prenatal care.  Future Appointments  Date Time Provider [place]  09/02/2022  2:30 PM Caren Macadam, MD [facility]  10/01/2022  8:30 AM [facility] LAB [facility]  10/01/2022  9:35 AM Anyanwu, Sallyanne Havers, MD [facility]  10/16/2022  1:30 PM Donnamae Jude, MD [facility]    Caren Macadam, MD

## 2022-09-02 ENCOUNTER — Telehealth (INDEPENDENT_AMBULATORY_CARE_PROVIDER_SITE_OTHER): Payer: Medicaid Other | Admitting: Family Medicine

## 2022-09-02 DIAGNOSIS — F17200 Nicotine dependence, unspecified, uncomplicated: Secondary | ICD-10-CM

## 2022-09-02 DIAGNOSIS — O09292 Supervision of pregnancy with other poor reproductive or obstetric history, second trimester: Secondary | ICD-10-CM

## 2022-09-02 DIAGNOSIS — O09299 Supervision of pregnancy with other poor reproductive or obstetric history, unspecified trimester: Secondary | ICD-10-CM

## 2022-09-02 DIAGNOSIS — O99891 Other specified diseases and conditions complicating pregnancy: Secondary | ICD-10-CM

## 2022-09-02 DIAGNOSIS — N870 Mild cervical dysplasia: Secondary | ICD-10-CM

## 2022-09-02 DIAGNOSIS — Z3A23 23 weeks gestation of pregnancy: Secondary | ICD-10-CM

## 2022-09-02 DIAGNOSIS — O99332 Smoking (tobacco) complicating pregnancy, second trimester: Secondary | ICD-10-CM

## 2022-09-02 DIAGNOSIS — F17210 Nicotine dependence, cigarettes, uncomplicated: Secondary | ICD-10-CM

## 2022-09-02 DIAGNOSIS — Z3480 Encounter for supervision of other normal pregnancy, unspecified trimester: Secondary | ICD-10-CM

## 2022-09-02 MED ORDER — NICOTINE POLACRILEX 2 MG MT LOZG: 2.0000 mg | LOZENGE | OROMUCOSAL | 1 refills | Status: DC | PRN

## 2022-09-02 NOTE — Progress Notes (Unsigned)
   PRENATAL VISIT NOTE  Subjective:  Stefanie Sparks is a 26 y.o. [phone] at [redacted]w[redacted]d being seen today for ongoing prenatal care.  She is currently monitored for the following issues for this {Blank single:19197::"high-risk","low-risk"} pregnancy and has Tobacco use disorder; Dysplasia of cervix, low grade (CIN 1); Supervision of other normal pregnancy, antepartum; and History of miscarriage, currently pregnant on their problem list.  Patient reports {sx:[id number]}.  Contractions: Not present. Vag. Bleeding: None.  Movement: Present. Denies leaking of fluid.   The following portions of the patient's history were reviewed and updated as appropriate: allergies, current medications, past family history, past medical history, past social history, past surgical history and problem list.   Objective:  There were no vitals filed for this visit.  Fetal Status:     Movement: Present     General:  Alert, oriented and cooperative. Patient is in no acute distress.  Skin: Skin is warm and dry. No rash noted.   Cardiovascular: Normal heart rate noted  Respiratory: Normal respiratory effort, no problems with respiration noted  Abdomen: Soft, gravid, appropriate for gestational age.  Pain/Pressure: Absent     Pelvic: {Blank single:19197::"Cervical exam performed in the presence of a chaperone","Cervical exam deferred"}        Extremities: Normal range of motion.     Mental Status: Normal mood and affect. Normal behavior. Normal judgment and thought content.   Assessment and Plan:  Pregnancy: [id number] at [redacted]w[redacted]d 1. Supervision of other normal pregnancy, antepartum ***  2. History of miscarriage, currently pregnant ***  3. Dysplasia of cervix, low grade (CIN 1) ***  4. Tobacco use disorder ***  {Blank single:19197::"Term","Preterm"} labor symptoms and general obstetric precautions including but not limited to vaginal bleeding, contractions, leaking of fluid and fetal movement were reviewed in detail with  the patient. Please refer to After Visit Summary for other counseling recommendations.   No follow-ups on file.  Future Appointments  Date Time Provider Department Center  10/01/2022  8:30 AM [facility] LAB [facility]  10/01/2022  9:35 AM Anyanwu, Jethro Bastos, MD [facility]  10/16/2022  1:30 PM Reva Bores, MD [facility]    Federico Flake, MD

## 2022-09-02 NOTE — Progress Notes (Unsigned)
OBSTETRICS PRENATAL VIRTUAL VISIT ENCOUNTER NOTE  Provider location: [facility]   Patient location: Home  I connected with Otho Darner on 09/04/22 at  2:30 PM EST by MyChart Video Encounter and verified that I am speaking with the correct person using two identifiers. I discussed the limitations, risks, security and privacy concerns of performing an evaluation and management service virtually and the availability of in person appointments. I also discussed with the patient that there may be a patient responsible charge related to this service. The patient expressed understanding and agreed to proceed.  Subjective:  Stefanie Sparks is a 26 y.o. [phone] at [redacted]w[redacted]d being seen today for ongoing prenatal care.  She is currently monitored for the following issues for this low-risk pregnancy and has Tobacco use disorder; Dysplasia of cervix, low grade (CIN 1); Supervision of other normal pregnancy, antepartum; and History of miscarriage, currently pregnant on their problem list.  Patient reports no complaints.  Contractions: Not present. Vag. Bleeding: None.  Movement: Present. Denies any leaking of fluid.   The following portions of the patient's history were reviewed and updated as appropriate: allergies, current medications, past family history, past medical history, past social history, past surgical history and problem list.   Objective:  There were no vitals filed for this visit.  Fetal Status:     Movement: Present     General:  Alert, oriented and cooperative. Patient is in no acute distress.  Respiratory: Normal respiratory effort, no problems with respiration noted  Mental Status: Normal mood and affect. Normal behavior. Normal judgment and thought content.  Rest of physical exam deferred due to type of encounter  Imaging: No results found.  Assessment and Plan:  Pregnancy: [id number] at [redacted]w[redacted]d 1. Supervision of other normal pregnancy,  antepartum Up to date Had episode of low BP  2. History of miscarriage, currently pregnant  3. Dysplasia of cervix, low grade (CIN 1) Repeat pap in 1 year  4. Tobacco use disorder Cut back to 2-3 cigs per day. Used motivational interviewing to discuss barriers to tobacco cessation. Spent 3-10 minutes in direct counseling about ways to reduce use with the goal of eventual cessation. Reviewed health concerns for pregnancy and infant related to tobacco use.  Discussed gradual reduction and replacement of cigs with other activities.  Patient stated goal: stop smoking Discussed use of lozenages for behavior change. Addi would like to try them and these were prescribed.    Preterm labor symptoms and general obstetric precautions including but not limited to vaginal bleeding, contractions, leaking of fluid and fetal movement were reviewed in detail with the patient. I discussed the assessment and treatment plan with the patient. The patient was provided an opportunity to ask questions and all were answered. The patient agreed with the plan and demonstrated an understanding of the instructions. The patient was advised to call back or seek an in-person office evaluation/go to [facility] at [facility] for any urgent or concerning symptoms. Please refer to After Visit Summary for other counseling recommendations.   I provided 15 minutes of face-to-face time during this encounter.  Return in about 4 weeks (around 09/30/2022) for Routine prenatal care, 28 wk labs.  Future Appointments  Date Time Provider Department Center  10/01/2022  8:30 AM [facility] LAB [facility]  10/01/2022  9:35 AM Anyanwu, Jethro Bastos, MD [facility]  10/16/2022  1:30 PM Reva Bores, MD [facility]    Federico Flake, MD  [facility] [place]

## 2022-09-09 IMAGING — CT CT VENOGRAM HEAD
3 of 11 series · 9 of 47 positions shown · IV contrast (APPLIED)
Comparison: 04/25/2014

CLINICAL DATA: Chronic headache with new features or increased
frequency. Headache for 2 weeks with fever and sore throat.
Pregnancy.

EXAM:
CT VENOGRAM HEAD
TECHNIQUE: Venographic phase images of the brain were obtained following the
administration of intravenous contrast. Multiplanar reformats and
maximum intensity projections were generated.

[Series 3: head bone · axial · 0.41mm/px · z∈[-78,+14]mm · 4 of 78 slices shown]
[im 16/78  bone]
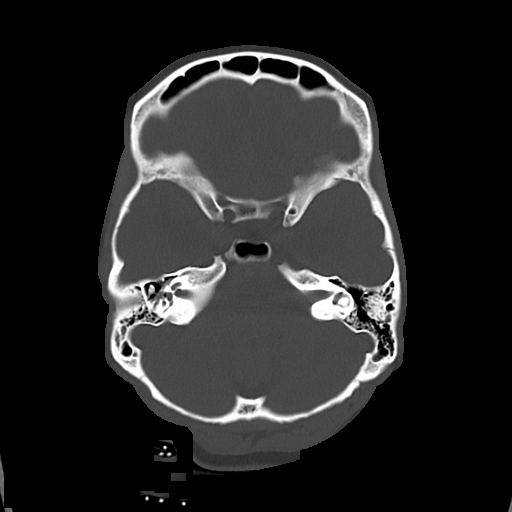
[im 31/78  bone]
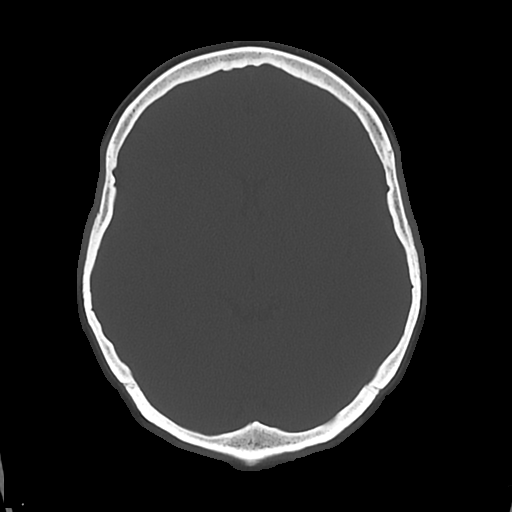
[im 47/78  bone]
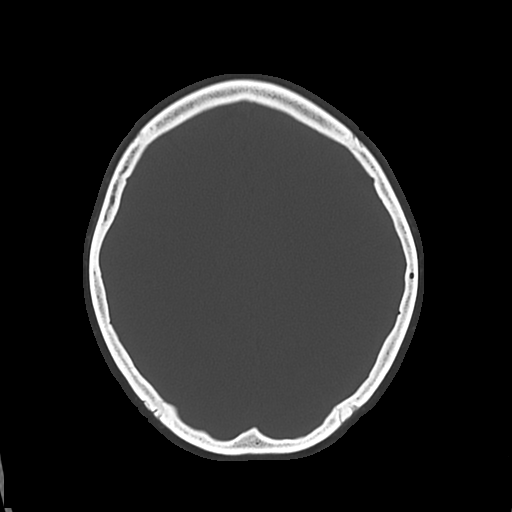
[im 62/78  bone]
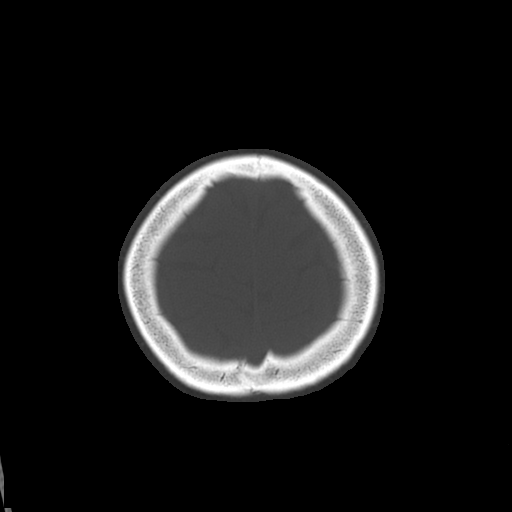

[Series 4: cor soft · coronal · 0.32mm/px · 1 of 64 slices shown]
[im 32/64  brain]
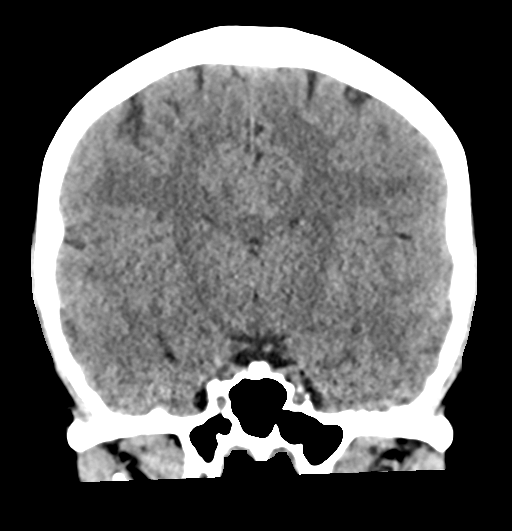

[Series 6: venogram · axial · 0.41mm/px · z∈[-78,+14]mm · 4 of 78 slices shown]
[im 16/78  brain]
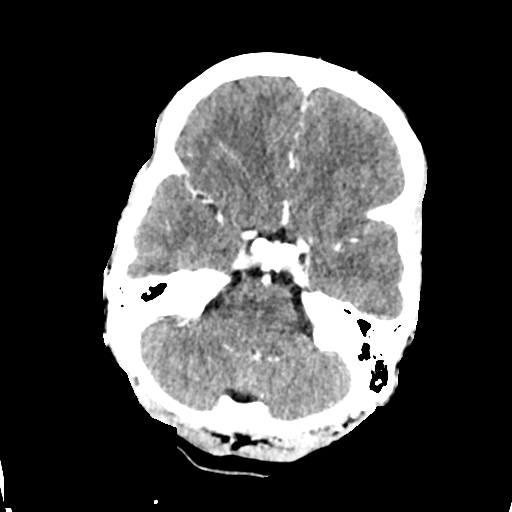
[im 31/78  bone]
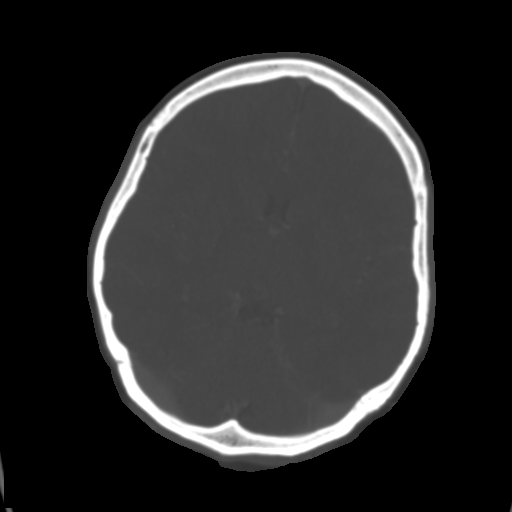
[im 47/78  brain]
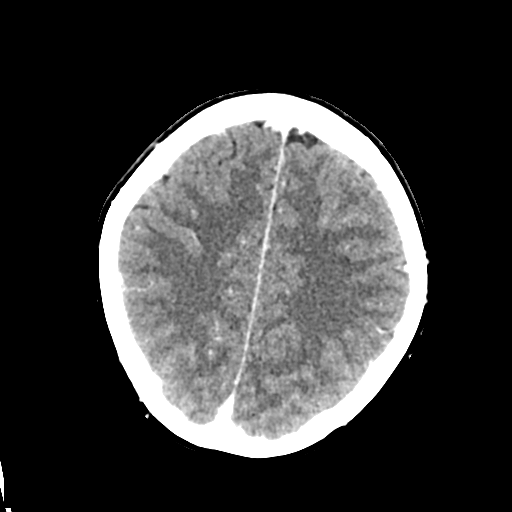
[im 62/78  bone]
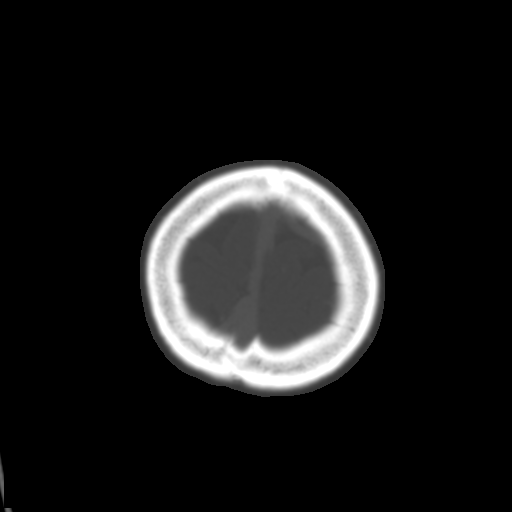

[9 of 47 positions shown; findings below may reference images not displayed]

RADIATION DOSE REDUCTION: This exam was performed according to the
departmental dose-optimization program which includes automated
exposure control, adjustment of the mA and/or kV according to
patient size and/or use of iterative reconstruction technique.

CONTRAST:  75mL OMNIPAQUE IOHEXOL 350 MG/ML SOLN
FINDINGS: Normal appearance of the brain. No infarct, hemorrhage,
hydrocephalus, mass, or collection.

[place] graphic phase shows widely patent dural sinuses. No evidence of
deep or cortical vein thrombus. Symmetric enhancement of major
arterial structures.

Covered paranasal sinuses are clear.  Negative covered orbits.
IMPRESSION: Normal CT venogram.

## 2022-09-09 IMAGING — US US OB < 14 WKS - US OB TV - US DOPPLER
1 series · 14 of 28 positions shown · non-contrast
Comparison: 08/17/2021

CLINICAL DATA: Spotting for a few hours. Quantitative beta HCG of
45



[Series 1: us ob less than 14 weeks with ob transvaginal · 14 of 77 slices shown]
[im 3/77]
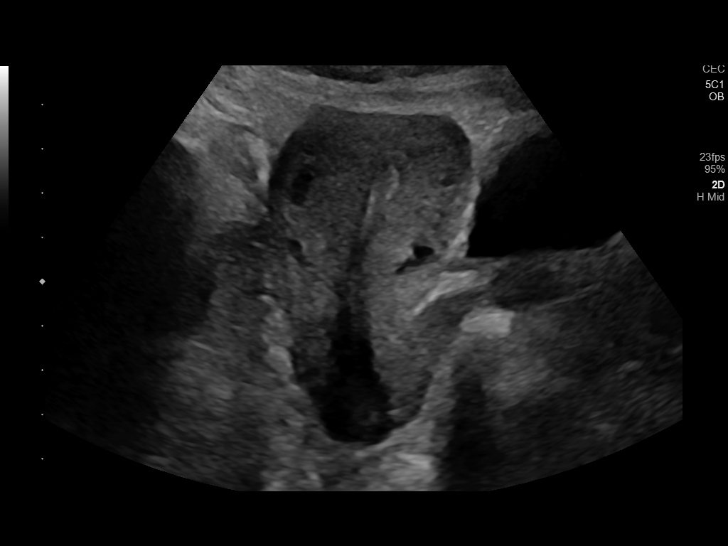
[im 9/77]
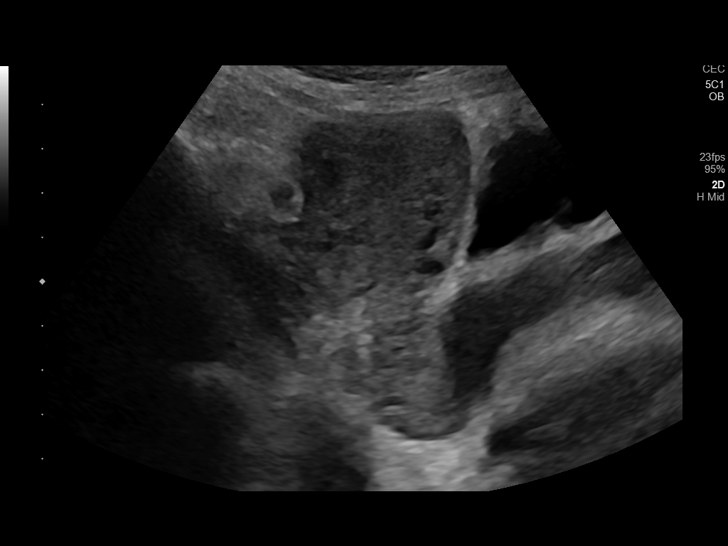
[im 15/77]
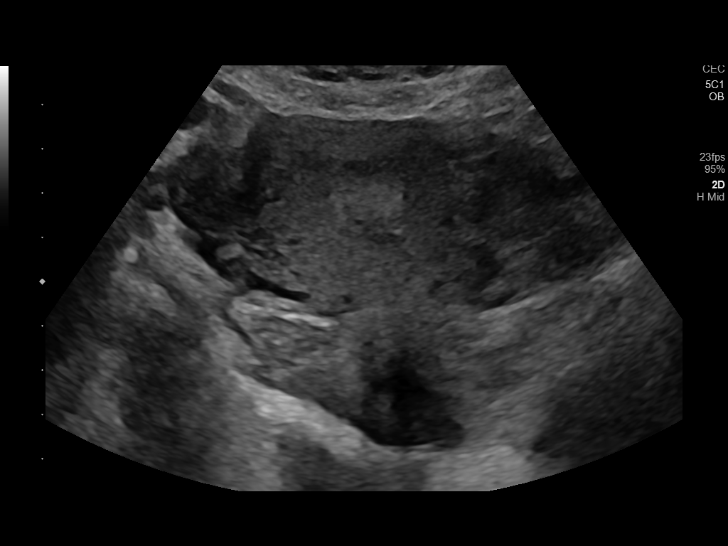
[im 20/77]
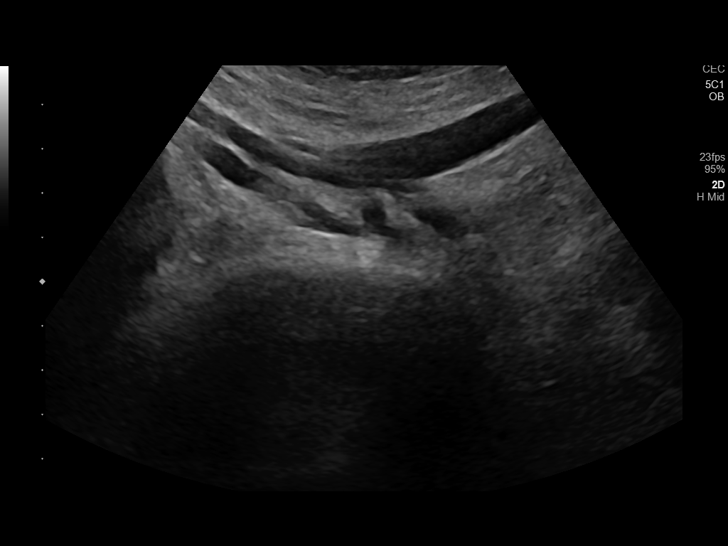
[im 26/77]
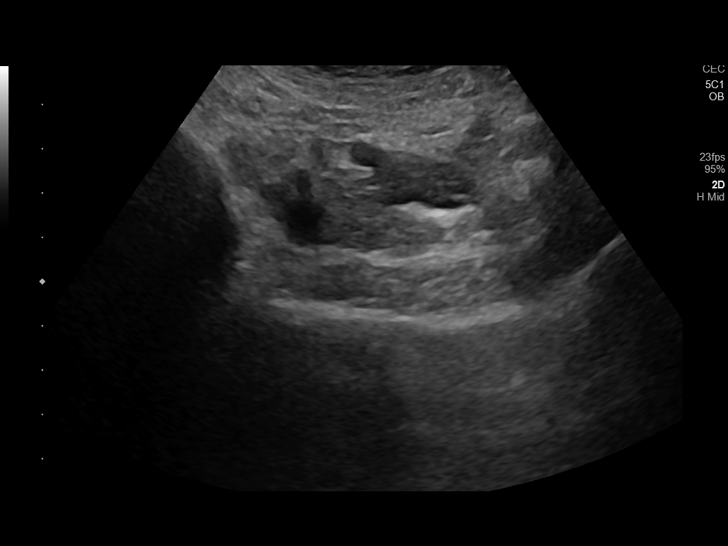
[im 31/77]
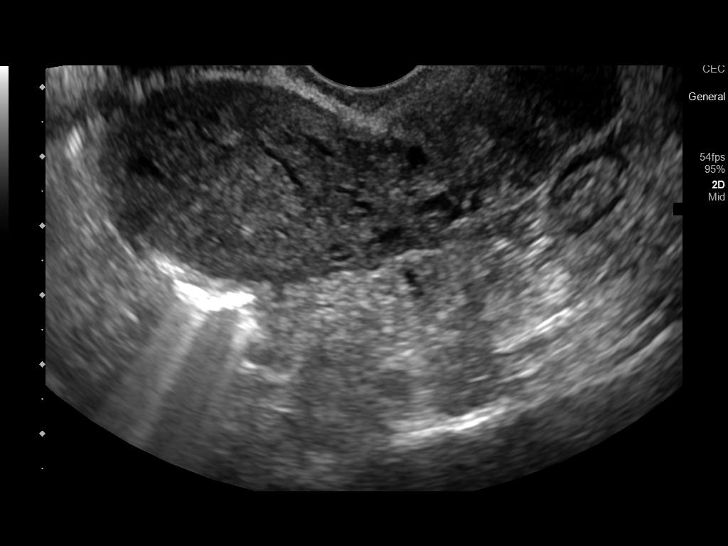
[im 37/77]
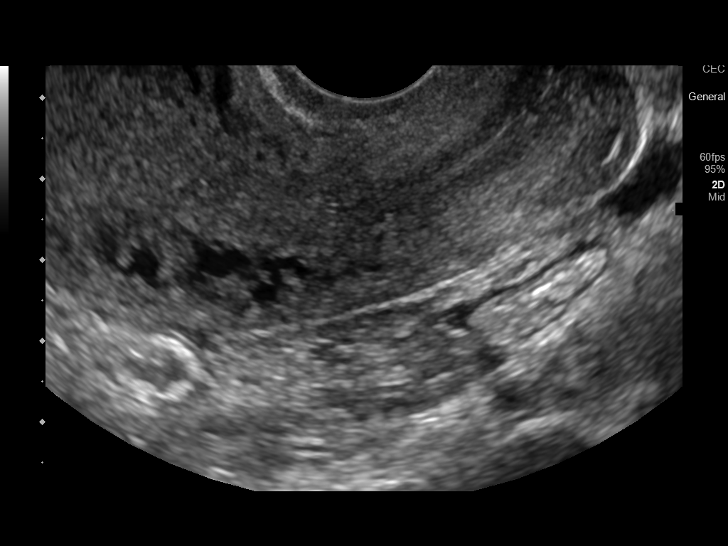
[im 43/77]
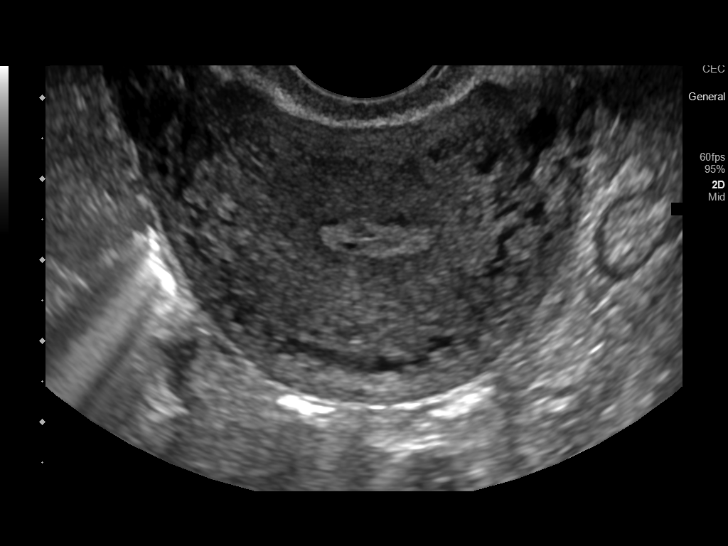
[im 48/77]
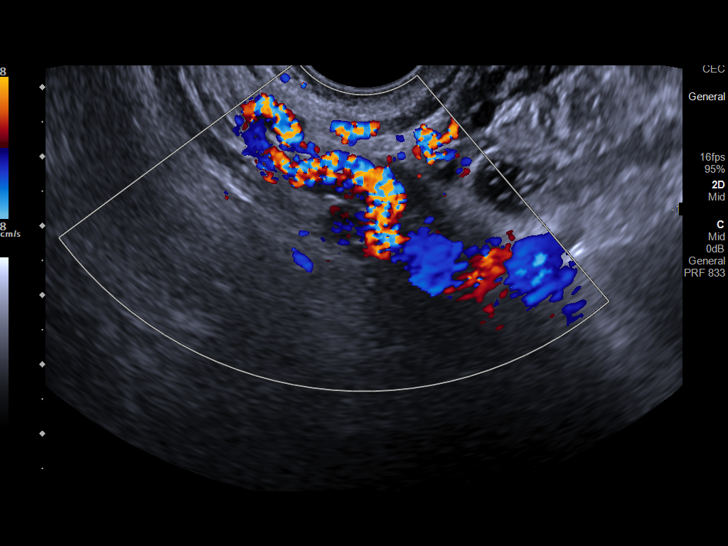
[im 54/77]
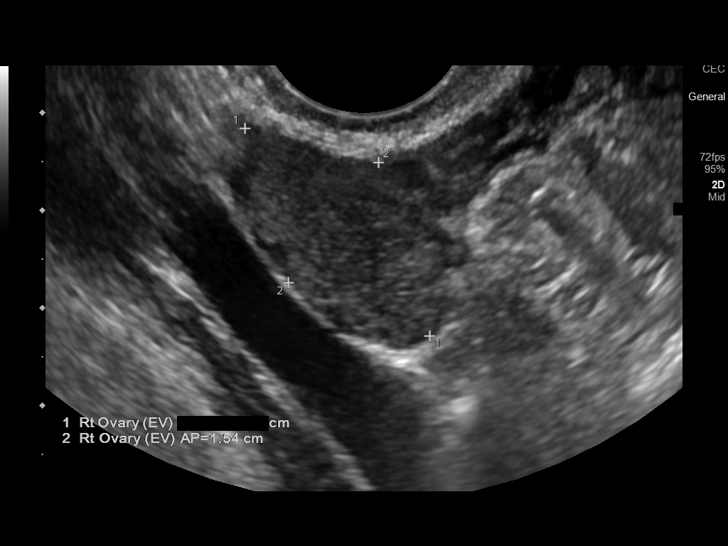
[im 60/77]
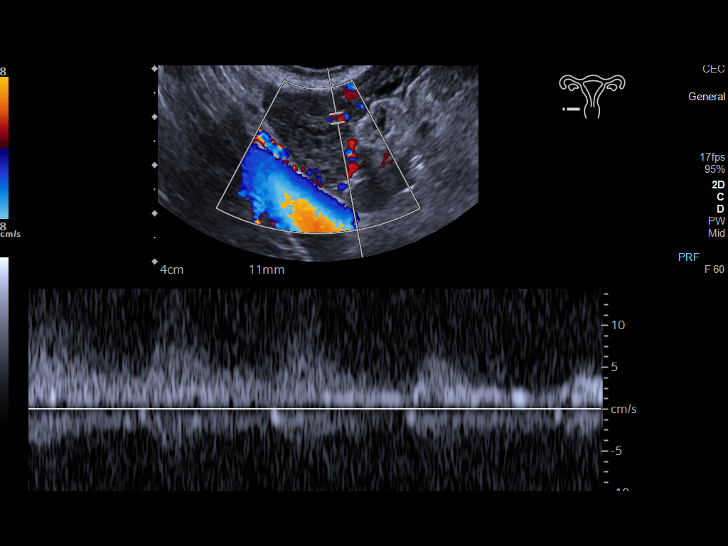
[im 65/77]
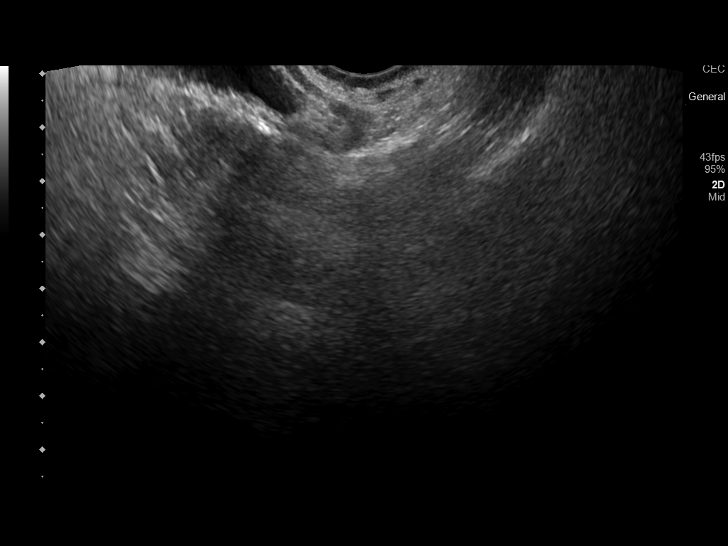
[im 71/77]
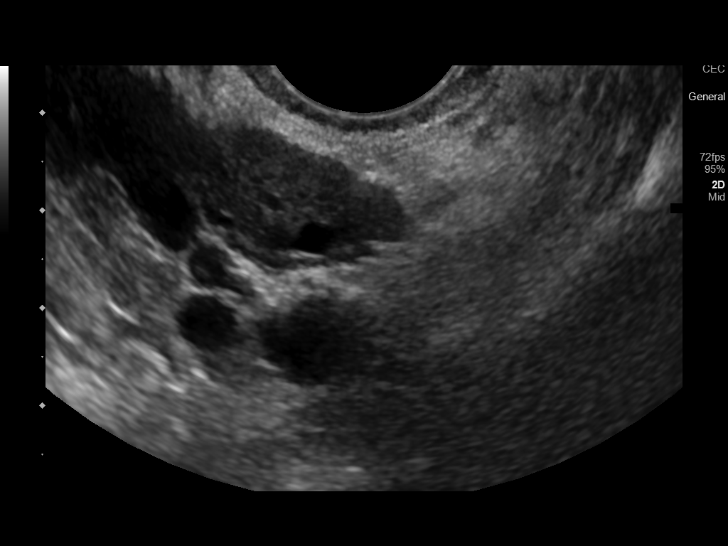
[im 77/77]
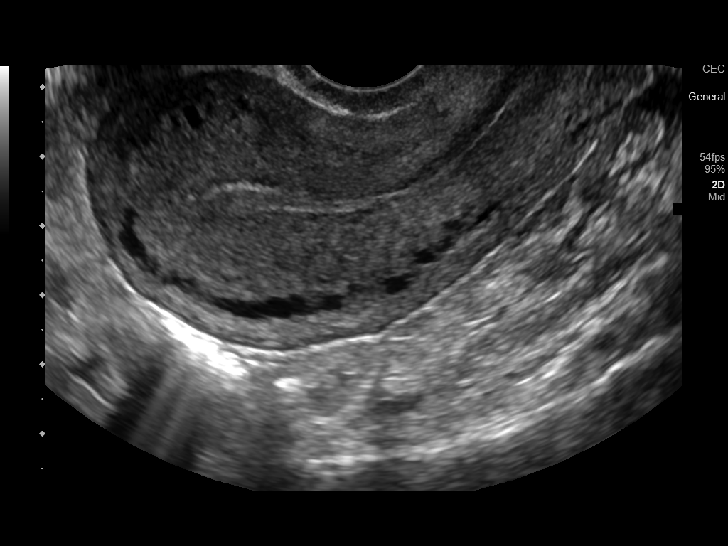

[14 of 28 positions shown; findings below may reference images not displayed]

FINDINGS: No intra or extra uterine gestational sac is seen. Negative for
adnexal mass. Symmetric normal appearance of the ovaries with normal
arterial and venous waveforms. No pelvic fluid. Differential
considerations include intrauterine gestation too early to be
sonographically visualized, spontaneous abortion, or ectopic
pregnancy.
IMPRESSION: Pregnancy of unknown location with normal pelvic ultrasound.
Consider follow-up ultrasound in 10 days and serial quantitative
beta HCG follow-up.

## 2022-09-13 ENCOUNTER — Encounter: Payer: Self-pay | Admitting: Family Medicine

## 2022-09-16 ENCOUNTER — Other Ambulatory Visit: Payer: Self-pay | Admitting: *Deleted

## 2022-09-16 MED ORDER — PANTOPRAZOLE SODIUM 40 MG PO TBEC
40.0000 mg | DELAYED_RELEASE_TABLET | Freq: Every day | ORAL | 2 refills | Status: DC
Start: 2022-09-16 — End: 2022-12-02

## 2022-09-27 ENCOUNTER — Ambulatory Visit (INDEPENDENT_AMBULATORY_CARE_PROVIDER_SITE_OTHER): Payer: Medicaid Other | Admitting: Obstetrics & Gynecology

## 2022-09-27 ENCOUNTER — Other Ambulatory Visit: Payer: Medicaid Other

## 2022-09-27 ENCOUNTER — Encounter: Payer: Self-pay | Admitting: Obstetrics & Gynecology

## 2022-09-27 VITALS — BP 100/65 | HR 84 | Wt 144.0 lb

## 2022-09-27 DIAGNOSIS — Z3482 Encounter for supervision of other normal pregnancy, second trimester: Secondary | ICD-10-CM

## 2022-09-27 DIAGNOSIS — Z3A26 26 weeks gestation of pregnancy: Secondary | ICD-10-CM | POA: Diagnosis not present

## 2022-09-27 DIAGNOSIS — Z1332 Encounter for screening for maternal depression: Secondary | ICD-10-CM

## 2022-09-27 DIAGNOSIS — Z3480 Encounter for supervision of other normal pregnancy, unspecified trimester: Secondary | ICD-10-CM

## 2022-09-27 NOTE — Progress Notes (Signed)
   PRENATAL VISIT NOTE  Subjective:  Stefanie Sparks is a 26 y.o. [phone] at [redacted]w[redacted]d being seen today for ongoing prenatal care.  She is currently monitored for the following issues for this low-risk pregnancy and has Tobacco use disorder; Dysplasia of cervix, low grade (CIN 1); Supervision of other normal pregnancy, antepartum; and History of miscarriage, currently pregnant on their problem list.  Patient reports no complaints.  Contractions: Not present. Vag. Bleeding: None.  Movement: Present. Denies leaking of fluid.   The following portions of the patient's history were reviewed and updated as appropriate: allergies, current medications, past family history, past medical history, past social history, past surgical history and problem list.   Objective:   Vitals:   09/27/22 0817  BP: 100/65  Pulse: 84  Weight: 144 lb (65.3 kg)    Fetal Status: Fetal Heart Rate (bpm): 150 Fundal Height: 27 cm Movement: Present     General:  Alert, oriented and cooperative. Patient is in no acute distress.  Skin: Skin is warm and dry. No rash noted.   Cardiovascular: Normal heart rate noted  Respiratory: Normal respiratory effort, no problems with respiration noted  Abdomen: Soft, gravid, appropriate for gestational age.  Pain/Pressure: Absent     Pelvic: Cervical exam deferred        Extremities: Normal range of motion.  Edema: None  Mental Status: Normal mood and affect. Normal behavior. Normal judgment and thought content.   Assessment and Plan:  Pregnancy: [id number] at [redacted]w[redacted]d 1. [redacted] weeks gestation of pregnancy 2. Supervision of other normal pregnancy, antepartum Unable to do 2 hr GTT today as she drank sweet tea, emphasized importance of having nothing to eat or drink (except sips of water) prior to test. Will check labs next visit.  No other concerns.  Counseled about IPP Mirena, she is interested in this. Also talked about RSV and Tdap vaccines. Preterm labor symptoms and general obstetric  precautions including but not limited to vaginal bleeding, contractions, leaking of fluid and fetal movement were reviewed in detail with the patient. Please refer to After Visit Summary for other counseling recommendations.   Return in about 19 days (around 10/16/2022) for 2 hr GTT, 3rd trimester labs, TDap, OFFICE OB VISIT (MD only).  Future Appointments  Date Time Provider Department Center  10/16/2022  1:30 PM Reva Bores, MD [facility]  11/06/2022 11:15 AM Reva Bores, MD [facility]  11/20/2022  9:35 AM Reva Bores, MD [facility]    Jaynie Collins, MD

## 2022-09-27 NOTE — Patient Instructions (Addendum)
Return to office for any scheduled appointments. Call the office or go to the MAU at [facility] if: You begin to have strong, frequent contractions Your water breaks.  Sometimes it is a big gush of fluid, sometimes it is just a trickle that keeps getting your underwear wet or running down your legs You have vaginal bleeding.  It is normal to have a small amount of spotting if your cervix was checked.  You do not feel your baby moving like normal.  If you do not, get something to eat and drink and lay down and focus on feeling your baby move.   If your baby is still not moving like normal, you should call the office or go to MAU. Any other obstetric concerns.  Oral Glucose Tolerance Test During Pregnancy Why am I having this test? The oral glucose tolerance test (GTT) is done to check how your body processes blood sugar (glucose). This is one of several tests used to diagnose diabetes that develops during pregnancy (gestational diabetes mellitus). Gestational diabetes is a short-term form of diabetes that some women develop while they are pregnant. It usually occurs during the second or third trimester of pregnancy and goes away after delivery. Testing, or screening, for gestational diabetes usually occurs around 28 of pregnancy. This test may also be needed earlier if: You have a history of gestational diabetes. There is a history of giving birth to very large babies or of losing pregnancies (having stillbirths). You have signs and symptoms of diabetes, such as: Changes in your eyesight. Tingling or numbness in your hands or feet. Changes in hunger, thirst, and urination, and these are not explained by your pregnancy. What is being tested? This test measures the amount of glucose in your blood at different times during a period of 2 hours. This shows how well your body can process glucose.  You will have three separate blood draws. What kind of sample is  taken?  Blood samples are required for this test. They are usually collected by inserting a needle into a blood vessel. How do I prepare for this test? For 3 days before your test, eat normally. Have plenty of carbohydrate-rich foods. You will be asked not to eat or drink anything other than water (to fast) starting 8-10 hours before the test. Tell a health care provider about: All medicines you are taking, including vitamins, herbs, eye drops, creams, and over-the-counter medicines. Any blood disorders you have. Any surgeries you have had. Any medical conditions you have. What happens during the test? First, your blood glucose will be measured. This is referred to as your fasting blood glucose because you fasted before the test. Then, you will drink a glucose solution that contains a certain amount of glucose. Your blood glucose will be measured again 1 and 2 hours after you drink the solution. This test takes about 2 hours to complete. You will need to stay at the testing location during this time. During the testing period: Do not eat or drink anything other than the glucose solution. Do not exercise. Do not use any products that contain nicotine or tobacco, such as cigarettes, e-cigarettes, and chewing tobacco. These can affect your test results. If you need help quitting, ask your health care provider. The testing procedure may vary among health care providers and hospitals. How are the results reported? Your results will be reported as milligrams of glucose per deciliter of blood (mg/dL) or millimoles per liter (mmol/L). There   is more than one source for screening and diagnosis reference values used to diagnose gestational diabetes. Your health care provider will compare your results to normal values that were established after testing a large group of people (reference values). Reference values may vary among labs and hospitals. For this test, reference values are: Fasting: 92 mg/dL 1  hour: 180 mg/dL  2 hour: 153 mg/dL   What do the results mean? Results below the reference values are considered normal. If one or more of your blood glucose levels are at or above the reference values, you will be diagnosed with gestational diabetes.  Talk with your health care provider about what your results mean. Questions to ask your health care provider Ask your health care provider, or the department that is doing the test: When will my results be ready? How will I get my results? What are my treatment options? What other tests do I need? What are my next steps? Summary The oral glucose tolerance test (GTT) is one of several tests used to diagnose diabetes that develops during pregnancy (gestational diabetes mellitus). Gestational diabetes is a short-term form of diabetes that some women develop while they are pregnant. You may also have this test if you have any symptoms or risk factors for this type of diabetes. Talk with your health care provider about what your results mean. This information is not intended to replace advice given to you by your health care provider. Make sure you discuss any questions you have with your health care provider.   TDaP Vaccine Pregnancy Get the Whooping Cough Vaccine While You Are Pregnant (CDC)  It is important for women to get the whooping cough vaccine in the third trimester of each pregnancy. Vaccines are the best way to prevent this disease. There are 2 different whooping cough vaccines. Both vaccines combine protection against whooping cough, tetanus and diphtheria, but they are for different age groups: Tdap: for everyone 11 years or older, including pregnant women  DTaP: for children 2 months through 6 years of age  You need the whooping cough vaccine during each of your pregnancies The recommended time to get the shot is during your 27th through 36th week of pregnancy, preferably during the earlier part of this time period. The Centers  for Disease Control and Prevention (CDC) recommends that pregnant women receive the whooping cough vaccine for adolescents and adults (called Tdap vaccine) during the third trimester of each pregnancy. The recommended time to get the shot is during your 27th through 36th week of pregnancy, preferably during the earlier part of this time period. This replaces the original recommendation that pregnant women get the vaccine only if they had not previously received it. The American College of Obstetricians and Gynecologists and the American College of Nurse-Midwives support this recommendation.  You should get the whooping cough vaccine while pregnant to pass protection to your baby frame support disabled and/or not supported in this browser  Learn why Laura decided to get the whooping cough vaccine in her 3rd trimester of pregnancy and how her baby girl was born with some protection against the disease. Also available on YouTube. After receiving the whooping cough vaccine, your body will create protective antibodies (proteins produced by the body to fight off diseases) and pass some of them to your baby before birth. These antibodies provide your baby some short-term protection against whooping cough in early life. These antibodies can also protect your baby from some of the more serious complications that come along   with whooping cough. Your protective antibodies are at their highest about 2 weeks after getting the vaccine, but it takes time to pass them to your baby. So the preferred time to get the whooping cough vaccine is early in your third trimester. The amount of whooping cough antibodies in your body decreases over time. That is why CDC recommends you get a whooping cough vaccine during each pregnancy. Doing so allows each of your babies to get the greatest number of protective antibodies from you. This means each of your babies will get the best protection possible against this disease.  Getting  the whooping cough vaccine while pregnant is better than getting the vaccine after you give birth Whooping cough vaccination during pregnancy is ideal so your baby will have short-term protection as soon as he is born. This early protection is important because your baby will not start getting his whooping cough vaccines until he is 2 months old. These first few months of life are when your baby is at greatest risk for catching whooping cough. This is also when he's at greatest risk for having severe, potentially life-threating complications from the infection. To avoid that gap in protection, it is best to get a whooping cough vaccine during pregnancy. You will then pass protection to your baby before he is born. To continue protecting your baby, he should get whooping cough vaccines starting at 2 months old. You may never have gotten the Tdap vaccine before and did not get it during this pregnancy. If so, you should make sure to get the vaccine immediately after you give birth, before leaving the hospital or birthing center. It will take about 2 weeks before your body develops protection (antibodies) in response to the vaccine. Once you have protection from the vaccine, you are less likely to give whooping cough to your newborn while caring for him. But remember, your baby will still be at risk for catching whooping cough from others. A recent study looked to see how effective Tdap was at preventing whooping cough in babies whose mothers got the vaccine while pregnant or in the hospital after giving birth. The study found that getting Tdap between 27 through 36 weeks of pregnancy is 85% more effective at preventing whooping cough in babies younger than 2 months old. Blood tests cannot tell if you need a whooping cough vaccine There are no blood tests that can tell you if you have enough antibodies in your body to protect yourself or your baby against whooping cough. Even if you have been sick with whooping  cough in the past or previously received the vaccine, you still should get the vaccine during each pregnancy. Breastfeeding may pass some protective antibodies onto your baby By breastfeeding, you may pass some antibodies you have made in response to the vaccine to your baby. When you get a whooping cough vaccine during your pregnancy, you will have antibodies in your breast milk that you can share with your baby as soon as your milk comes in. However, your baby will not get protective antibodies immediately if you wait to get the whooping cough vaccine until after delivering your baby. This is because it takes about 2 weeks for your body to create antibodies. Learn more about the health benefits of breastfeeding.  

## 2022-10-01 ENCOUNTER — Other Ambulatory Visit: Payer: Medicaid Other

## 2022-10-01 ENCOUNTER — Encounter: Payer: Medicaid Other | Admitting: Obstetrics & Gynecology

## 2022-10-16 ENCOUNTER — Encounter: Payer: Medicaid Other | Admitting: Family Medicine

## 2022-10-18 ENCOUNTER — Encounter: Payer: Medicaid Other | Admitting: Obstetrics & Gynecology

## 2022-10-21 NOTE — L&D Delivery Note (Signed)
Delivery Note  Zorana LEITHA MANCHEGO is a [id number] at 87w2dwith an LMP of 03/10/22, not consistent with [place] [redacted]w[redacted]d  First Stage: Labor onset:12/15/22 @ 2000 Augmentation: oxytocin and AROM Analgesia /Anesthesia intrapartum: epidural AROM at 2018 GBS: negative IP Antibiotics: none  Second Stage: Complete dilation at 0200 Onset of pushing at 0215 FHR second stage 135 bpm with moderate, variable decels with pushing   Judeth presented to L&D with uterine contractions. She was 5/60/-1. She progressed  to C/C/+2 with a spontaneous urge to push.  She pushed  effectively over approximately 1 minute for a spontaneous vaginal birth.  Delivery of a viable baby girl on 12/16/22 at 02Hollandy CNM Delivery of fetal head in OA position with restitution to ROT. no nuchal cord;  Anterior then posterior shoulders delivered easily with gentle downward traction. Baby placed on mom's chest, and attended to by baby RN Cord double clamped after cessation of pulsation, cut by FOB  Cord blood sample collection: Yes O POS Collection of cord blood donation n/a Arterial cord blood sample  n/a  Third Stage: Oxytocin bolus started after delivery of infant for hemorrhage prophylaxis  Placenta delivered ScDelena Balintact with 3 VC @ 0226 Placenta disposition: discarded per protocol Uterine tone firm / bleeding scant  no laceration identified  Anesthesia for repair: n/a Repair n/a Est. Blood Loss (mL): 15  Complications: none  Mom to postpartum.  Baby to Couplet care / Skin to Skin.  Newborn: Information for the patient's newborn:  WiZumbrunnenGirl Jordie [[id number] born female "Kaisley Birth Weight:   APGAR: ,   Newborn Delivery   Birth date/time: 12/16/2022 02:18:20 Delivery type: Vaginal, Spontaneous       Feeding planned: breast feeding  ---------- FeAvelino LeedsCNM Certified Nurse Midwife [facility]

## 2022-10-22 ENCOUNTER — Encounter: Payer: Self-pay | Admitting: Obstetrics & Gynecology

## 2022-10-22 ENCOUNTER — Ambulatory Visit (INDEPENDENT_AMBULATORY_CARE_PROVIDER_SITE_OTHER): Payer: Medicaid Other | Admitting: Obstetrics & Gynecology

## 2022-10-22 ENCOUNTER — Other Ambulatory Visit: Payer: Medicaid Other

## 2022-10-22 VITALS — BP 97/67 | HR 94 | Wt 149.0 lb

## 2022-10-22 DIAGNOSIS — Z23 Encounter for immunization: Secondary | ICD-10-CM

## 2022-10-22 DIAGNOSIS — Z3480 Encounter for supervision of other normal pregnancy, unspecified trimester: Secondary | ICD-10-CM

## 2022-10-22 DIAGNOSIS — Z3483 Encounter for supervision of other normal pregnancy, third trimester: Secondary | ICD-10-CM

## 2022-10-22 DIAGNOSIS — Z3A30 30 weeks gestation of pregnancy: Secondary | ICD-10-CM

## 2022-10-22 DIAGNOSIS — O99013 Anemia complicating pregnancy, third trimester: Secondary | ICD-10-CM

## 2022-10-22 NOTE — Progress Notes (Signed)
   PRENATAL VISIT NOTE  Subjective:  Stefanie Sparks is a 27 y.o. [phone] at [redacted]w[redacted]d being seen today for ongoing prenatal care.  She is currently monitored for the following issues for this low-risk pregnancy and has Tobacco use disorder; Dysplasia of cervix, low grade (CIN 1); Supervision of other normal pregnancy, antepartum; and History of miscarriage, currently pregnant on their problem list.  Patient reports no complaints.  Contractions: Not present. Vag. Bleeding: None.  Movement: Present. Denies leaking of fluid.   The following portions of the patient's history were reviewed and updated as appropriate: allergies, current medications, past family history, past medical history, past social history, past surgical history and problem list.   Objective:   Vitals:   10/22/22 0844  BP: 97/67  Pulse: 94  Weight: 149 lb (67.6 kg)    Fetal Status: Fetal Heart Rate (bpm): 145 Fundal Height: 30 cm Movement: Present     General:  Alert, oriented and cooperative. Patient is in no acute distress.  Skin: Skin is warm and dry. No rash noted.   Cardiovascular: Normal heart rate noted  Respiratory: Normal respiratory effort, no problems with respiration noted  Abdomen: Soft, gravid, appropriate for gestational age.  Pain/Pressure: Absent     Pelvic: Cervical exam deferred        Extremities: Normal range of motion.  Edema: None  Mental Status: Normal mood and affect. Normal behavior. Normal judgment and thought content.   Assessment and Plan:  Pregnancy: [id number] at [redacted]w[redacted]d 1. Need for Tdap vaccination - Tdap vaccine greater than or equal to 7yo IM given today.  2. [redacted] weeks gestation of pregnancy 3. Supervision of other normal pregnancy, antepartum Third trimester labs today, will follow up results and manage accordingly. Preterm labor symptoms and general obstetric precautions including but not limited to vaginal bleeding, contractions, leaking of fluid and fetal movement were reviewed in  detail with the patient. Please refer to After Visit Summary for other counseling recommendations.   Return in about 2 weeks (around 11/05/2022) for OFFICE OB VISIT (MD or APP).  Future Appointments  Date Time Provider [place]  10/22/2022 10:35 AM Diego Ulbricht, Sallyanne Havers, MD [facility]  11/06/2022 11:15 AM Donnamae Jude, MD [facility]  11/20/2022  9:35 AM Donnamae Jude, MD [facility]    Verita Schneiders, MD

## 2022-10-22 NOTE — Progress Notes (Unsigned)
ROB   Saw PCP for reflex Rx prescribed.

## 2022-10-23 ENCOUNTER — Encounter: Payer: Self-pay | Admitting: Obstetrics & Gynecology

## 2022-10-23 DIAGNOSIS — O99013 Anemia complicating pregnancy, third trimester: Secondary | ICD-10-CM | POA: Insufficient documentation

## 2022-10-23 LAB — CBC
Hematocrit: 30.8 % — ABNORMAL LOW (ref 34.0–46.6)
Hemoglobin: 10.3 g/dL — ABNORMAL LOW (ref 11.1–15.9)
MCH: 29.3 pg (ref 26.6–33.0)
MCHC: 33.4 g/dL (ref 31.5–35.7)
MCV: 88 fL (ref 79–97)
Platelets: 227 10*3/uL (ref 150–450)
RBC: 3.51 x10E6/uL — ABNORMAL LOW (ref 3.77–5.28)
RDW: 11.8 % (ref 11.7–15.4)
WBC: 9.3 10*3/uL (ref 3.4–10.8)

## 2022-10-23 LAB — GLUCOSE TOLERANCE, 2 HOURS W/ 1HR
Glucose, 1 hour: 122 mg/dL (ref 70–179)
Glucose, 2 hour: 89 mg/dL (ref 70–152)
Glucose, Fasting: 83 mg/dL (ref 70–91)

## 2022-10-23 LAB — RPR: RPR Ser Ql: NONREACTIVE

## 2022-10-23 LAB — HIV ANTIBODY (ROUTINE TESTING W REFLEX): HIV Screen 4th Generation wRfx: NONREACTIVE

## 2022-10-23 MED ORDER — FERROUS SULFATE 325 (65 FE) MG PO TABS: 325.0000 mg | ORAL_TABLET | ORAL | 3 refills | Status: AC

## 2022-10-28 ENCOUNTER — Encounter: Payer: Self-pay | Admitting: *Deleted

## 2022-10-28 ENCOUNTER — Encounter: Payer: Self-pay | Admitting: Family Medicine

## 2022-11-01 ENCOUNTER — Other Ambulatory Visit: Payer: Self-pay | Admitting: *Deleted

## 2022-11-01 ENCOUNTER — Encounter: Payer: Self-pay | Admitting: Family Medicine

## 2022-11-01 MED ORDER — CYCLOBENZAPRINE HCL 10 MG PO TABS: 10.0000 mg | ORAL_TABLET | Freq: Three times a day (TID) | ORAL | 2 refills | Status: DC | PRN

## 2022-11-06 ENCOUNTER — Encounter: Payer: Medicaid Other | Admitting: Family Medicine

## 2022-11-07 ENCOUNTER — Encounter: Payer: Self-pay | Admitting: Obstetrics and Gynecology

## 2022-11-07 ENCOUNTER — Ambulatory Visit (INDEPENDENT_AMBULATORY_CARE_PROVIDER_SITE_OTHER): Payer: Medicaid Other | Admitting: Obstetrics and Gynecology

## 2022-11-07 VITALS — BP 121/79 | HR 111 | Wt 151.0 lb

## 2022-11-07 DIAGNOSIS — Z3A32 32 weeks gestation of pregnancy: Secondary | ICD-10-CM

## 2022-11-07 DIAGNOSIS — Z3A33 33 weeks gestation of pregnancy: Secondary | ICD-10-CM

## 2022-11-07 DIAGNOSIS — Z3480 Encounter for supervision of other normal pregnancy, unspecified trimester: Secondary | ICD-10-CM

## 2022-11-07 DIAGNOSIS — Z3483 Encounter for supervision of other normal pregnancy, third trimester: Secondary | ICD-10-CM

## 2022-11-07 NOTE — Progress Notes (Signed)
ROB [redacted]w[redacted]d   CC: Hip pain relief with flexeril.

## 2022-11-09 NOTE — Progress Notes (Signed)
   PRENATAL VISIT NOTE  Subjective:  Stefanie Sparks is a 27 y.o. [phone] at [redacted]w[redacted]d being seen today for ongoing prenatal care.  She is currently monitored for the following issues for this low-risk pregnancy and has Tobacco use disorder; Dysplasia of cervix, low grade (CIN 1); Supervision of other normal pregnancy, antepartum; and Anemia during pregnancy in third trimester on their problem list.  Patient reports hip pain relief with flexeril.  Contractions: Irritability. Vag. Bleeding: None.  Movement: Present. Denies leaking of fluid.   The following portions of the patient's history were reviewed and updated as appropriate: allergies, current medications, past family history, past medical history, past social history, past surgical history and problem list.   Objective:   Vitals:   11/07/22 1610  BP: 121/79  Pulse: (!) 111  Weight: 151 lb (68.5 kg)    Fetal Status: Fetal Heart Rate (bpm): 144 Fundal Height: 32 cm Movement: Present  Presentation: Vertex  General:  Alert, oriented and cooperative. Patient is in no acute distress.  Skin: Skin is warm and dry. No rash noted.   Cardiovascular: Normal heart rate noted  Respiratory: Normal respiratory effort, no problems with respiration noted  Abdomen: Soft, gravid, appropriate for gestational age.  Pain/Pressure: Present     Pelvic: Cervical exam deferred        Extremities: Normal range of motion.  Edema: Trace  Mental Status: Normal mood and affect. Normal behavior. Normal judgment and thought content.   Assessment and Plan:  Pregnancy: [id number] at [redacted]w[redacted]d 1. Supervision of other normal pregnancy, antepartum Routine care  2. [redacted] weeks gestation of pregnancy  Preterm labor symptoms and general obstetric precautions including but not limited to vaginal bleeding, contractions, leaking of fluid and fetal movement were reviewed in detail with the patient. Please refer to After Visit Summary for other counseling recommendations.   No  follow-ups on file.  Future Appointments  Date Time Provider Department Center  11/20/2022  9:35 AM Donnamae Jude, MD [facility]  12/04/2022  8:55 AM Donnamae Jude, MD [facility]  12/11/2022 10:35 AM Donnamae Jude, MD [facility]  12/18/2022  8:55 AM Donnamae Jude, MD [facility]  12/25/2022  8:55 AM Donnamae Jude, MD [facility]  01/01/2023  8:55 AM Donnamae Jude, MD [facility]    Aletha Halim, MD

## 2022-11-17 ENCOUNTER — Observation Stay: Payer: Medicaid Other

## 2022-11-17 ENCOUNTER — Encounter: Payer: Self-pay | Admitting: Obstetrics and Gynecology

## 2022-11-17 ENCOUNTER — Observation Stay
Admission: EM | Admit: 2022-11-17 | Discharge: 2022-11-17 | Disposition: A | Discharge status: Home / Self Care | Payer: Medicaid Other | Attending: Obstetrics and Gynecology | Admitting: Obstetrics and Gynecology

## 2022-11-17 ENCOUNTER — Other Ambulatory Visit: Payer: Self-pay

## 2022-11-17 DIAGNOSIS — F17210 Nicotine dependence, cigarettes, uncomplicated: Secondary | ICD-10-CM | POA: Diagnosis not present

## 2022-11-17 DIAGNOSIS — Z3480 Encounter for supervision of other normal pregnancy, unspecified trimester: Secondary | ICD-10-CM

## 2022-11-17 DIAGNOSIS — O99333 Smoking (tobacco) complicating pregnancy, third trimester: Secondary | ICD-10-CM | POA: Insufficient documentation

## 2022-11-17 DIAGNOSIS — Z3A34 34 weeks gestation of pregnancy: Secondary | ICD-10-CM | POA: Insufficient documentation

## 2022-11-17 DIAGNOSIS — O368130 Decreased fetal movements, third trimester, not applicable or unspecified: Principal | ICD-10-CM | POA: Insufficient documentation

## 2022-11-17 DIAGNOSIS — Z79899 Other long term (current) drug therapy: Secondary | ICD-10-CM | POA: Insufficient documentation

## 2022-11-17 DIAGNOSIS — O4703 False labor before 37 completed weeks of gestation, third trimester: Secondary | ICD-10-CM | POA: Diagnosis present

## 2022-11-17 NOTE — OB Triage Note (Signed)
Pt is a G6P3 and [redacted]w[redacted]d presenting to L&D with complaint of decreased fetal movement since 2100 last night. Pt stated she only had "4 movements within a 2 hour period" of doing fetal kick counts. Pt last felt baby move at 0900 this morning. Pt denies LOF, VB and ctx. VSS. CNM made aware of pts arrival. Monitors applied and assessing.

## 2022-11-17 NOTE — Discharge Summary (Signed)
Stefanie Sparks is a 27 y.o. female. She is at [redacted]w[redacted]d gestation. Patient's last menstrual period was 03/10/2022. Estimated Date of Delivery: 12/28/22  [facility]: [facility]   Current pregnancy complicated by:  Tobacco use, approx 4 cig daily Iron deficiency anemia Hx 2 miscarriages  Chief complaint:decreased  fetal movement since yesterday evening, did fetal kick count this morning and felt 6 movements.    S: Resting comfortably. no CTX, no VB.no LOF,  Active fetal movement.  Denies: HA, visual changes, SOB, or RUQ/epigastric pain  Maternal Medical History:   Past Medical History:  Diagnosis Date   Chronic pelvic pain in female 10/02/2018   Endometriosis    Gastroenteritis 04/06/2016   History of colposcopy    Migraine    Vaginal Pap smear, abnormal     Past Surgical History:  Procedure Laterality Date   DILATION AND EVACUATION N/A 09/20/2021   Procedure: DILATATION AND EVACUATION;  Surgeon: Gae Dry, MD;  Location: [facility] ORS;  Service: Gynecology;  Laterality: N/A;   WISDOM TOOTH EXTRACTION      Allergies  Allergen Reactions   Other Other (See Comments)    oranges Uncoded Allergy. Allergen: Shellfish, Other Reaction: Other reaction    Prior to Admission medications   Medication Sig Start Date End Date Taking? Authorizing Provider  cyclobenzaprine (FLEXERIL) 10 MG tablet Take 1 tablet (10 mg total) by mouth 3 (three) times daily as needed for muscle spasms. 11/01/22  Yes Caren Macadam, MD  ferrous sulfate (FERROUSUL) 325 (65 FE) MG tablet Take 1 tablet (325 mg total) by mouth every other day. 10/23/22  Yes Anyanwu, Sallyanne Havers, MD  pantoprazole (PROTONIX) 40 MG tablet Take 1 tablet (40 mg total) by mouth daily. 09/16/22  Yes Anyanwu, Sallyanne Havers, MD  Prenatal Vit-Fe Fumarate-FA (MULTIVITAMIN-PRENATAL) 27-0.8 MG TABS tablet Take 1 tablet by mouth daily at 12 noon.   Yes [provider]  sucralfate (CARAFATE) 1 GM/10ML suspension Take by mouth. 10/17/22  10/17/23 Yes [provider]  nicotine polacrilex (COMMIT) 2 MG lozenge Take 1 lozenge (2 mg total) by mouth as needed for smoking cessation. Patient not taking: Reported on 11/17/2022 09/02/22   Caren Macadam, MD  ondansetron (ZOFRAN-ODT) 4 MG disintegrating tablet Take 1 tablet (4 mg total) by mouth every 6 (six) hours as needed for nausea. Patient not taking: Reported on 11/17/2022 05/14/22   Donnamae Jude, MD      Social History: She  reports that she has been smoking cigarettes. She has a 2.25 pack-year smoking history. She has never used smokeless tobacco. She reports that she does not currently use alcohol. She reports that she does not currently use drugs after having used the following drugs: Marijuana.  Family History: family history includes Asthma in her brother and son; Bipolar disorder in her brother and father; Breast cancer in her maternal great-grandmother; Diabetes in her maternal grandmother; Endometriosis in her maternal grandmother and mother; Seizures in her son.   Review of Systems: A full review of systems was performed and negative except as noted in the HPI.     O:  BP 118/71 (BP Location: Left Arm)   Pulse (!) 109   Temp 98.2 F (36.8 C) (Oral)   Resp 16   Ht 4\' 11"  (1.499 m)   Wt 68.5 kg   LMP 03/10/2022   BMI 30.50 kg/m  No results found for this or any previous visit (from the past 22 hour(s)).   Constitutional: NAD, AAOx3  HE/ENT:  extraocular movements grossly intact, moist mucous membranes CV: RRR PULM: nl respiratory effort, CTABL     Abd: gravid, non-tender, non-distended, soft      Ext: Non-tender, Nonedematous   Psych: mood appropriate, speech normal Pelvic: deferred  Fetal  monitoring: Cat I Appropriate for GA Baseline: 150bpm Variability: moderate Accelerations: present x >2 Decelerations absent Time 41mins   A/P: 27 y.o. [redacted]w[redacted]d here for antenatal surveillance for decreased fetal movement  Principle Diagnosis:   decreased fetal movement, 34wks  Preeterm labor: not present.  Fetal Wellbeing: Reassuring Cat 1 tracing with Reactive NST  [place] ordered to check fluid. AFI 13cm D/c home stable, precautions reviewed, follow-up as scheduled.    Francetta Found, CNM 11/17/2022  4:50 PM

## 2022-11-17 NOTE — OB Triage Note (Signed)
Discharge instructions reviewed and pt verbalized understanding. Follow-up care reviewed. Pt stable at the time of discharge by self.

## 2022-11-20 ENCOUNTER — Ambulatory Visit (INDEPENDENT_AMBULATORY_CARE_PROVIDER_SITE_OTHER): Payer: Medicaid Other | Admitting: Family Medicine

## 2022-11-20 VITALS — BP 103/69 | HR 97 | Wt 155.0 lb

## 2022-11-20 DIAGNOSIS — Z3480 Encounter for supervision of other normal pregnancy, unspecified trimester: Secondary | ICD-10-CM

## 2022-11-20 DIAGNOSIS — Z3A34 34 weeks gestation of pregnancy: Secondary | ICD-10-CM

## 2022-11-20 DIAGNOSIS — N870 Mild cervical dysplasia: Secondary | ICD-10-CM

## 2022-11-20 DIAGNOSIS — O4703 False labor before 37 completed weeks of gestation, third trimester: Secondary | ICD-10-CM

## 2022-11-20 DIAGNOSIS — Z3483 Encounter for supervision of other normal pregnancy, third trimester: Secondary | ICD-10-CM

## 2022-11-20 NOTE — Progress Notes (Signed)
   PRENATAL VISIT NOTE  Subjective:  Stefanie Sparks is a 27 y.o. [phone] at [redacted]w[redacted]d being seen today for ongoing prenatal care.  She is currently monitored for the following issues for this low-risk pregnancy and has Tobacco use disorder; Dysplasia of cervix, low grade (CIN 1); Supervision of other normal pregnancy, antepartum; Anemia during pregnancy in third trimester; and Preterm uterine contractions in third trimester, antepartum on their problem list.  Patient reports no complaints.  Contractions: Irritability. Vag. Bleeding: None.  Movement: Present. Denies leaking of fluid.   The following portions of the patient's history were reviewed and updated as appropriate: allergies, current medications, past family history, past medical history, past social history, past surgical history and problem list.   Objective:   Vitals:   11/20/22 0939  BP: 103/69  Pulse: 97  Weight: 155 lb (70.3 kg)    Fetal Status: Fetal Heart Rate (bpm): 152   Movement: Present     General:  Alert, oriented and cooperative. Patient is in no acute distress.  Skin: Skin is warm and dry. No rash noted.   Cardiovascular: Normal heart rate noted  Respiratory: Normal respiratory effort, no problems with respiration noted  Abdomen: Soft, gravid, appropriate for gestational age.  Pain/Pressure: Present     Pelvic: Cervical exam deferred        Extremities: Normal range of motion.  Edema: Trace  Mental Status: Normal mood and affect. Normal behavior. Normal judgment and thought content.   Assessment and Plan:  Pregnancy: [id number] at [redacted]w[redacted]d 1. Supervision of other normal pregnancy, antepartum Continue routine prenatal care.  2. Preterm uterine contractions in third trimester, antepartum No evidence of PTL today  3. Dysplasia of cervix, low grade (CIN 1) Last pap was WNL  Preterm labor symptoms and general obstetric precautions including but not limited to vaginal bleeding, contractions, leaking of fluid and  fetal movement were reviewed in detail with the patient. Please refer to After Visit Summary for other counseling recommendations.   Return in 2 weeks (on 12/04/2022).  Future Appointments  Date Time Provider Department Center  12/04/2022  8:55 AM Donnamae Jude, MD [facility]  12/11/2022 10:35 AM Donnamae Jude, MD [facility]  12/18/2022  8:55 AM Donnamae Jude, MD [facility]  12/25/2022  8:55 AM Donnamae Jude, MD [facility]  01/01/2023  8:55 AM Donnamae Jude, MD [facility]    Donnamae Jude, MD

## 2022-11-28 ENCOUNTER — Other Ambulatory Visit: Payer: Self-pay | Admitting: Obstetrics & Gynecology

## 2022-11-28 ENCOUNTER — Encounter: Payer: Self-pay | Admitting: Family Medicine

## 2022-11-29 ENCOUNTER — Other Ambulatory Visit: Payer: Self-pay

## 2022-11-29 ENCOUNTER — Observation Stay
Admission: EM | Admit: 2022-11-29 | Discharge: 2022-11-29 | Disposition: A | Discharge status: Home / Self Care | Payer: Medicaid Other | Attending: Obstetrics and Gynecology | Admitting: Obstetrics and Gynecology

## 2022-11-29 ENCOUNTER — Encounter: Payer: Self-pay | Admitting: Obstetrics and Gynecology

## 2022-11-29 DIAGNOSIS — O99333 Smoking (tobacco) complicating pregnancy, third trimester: Secondary | ICD-10-CM | POA: Insufficient documentation

## 2022-11-29 DIAGNOSIS — O4193X0 Disorder of amniotic fluid and membranes, unspecified, third trimester, not applicable or unspecified: Secondary | ICD-10-CM | POA: Diagnosis not present

## 2022-11-29 DIAGNOSIS — Z3A36 36 weeks gestation of pregnancy: Secondary | ICD-10-CM | POA: Insufficient documentation

## 2022-11-29 DIAGNOSIS — F17210 Nicotine dependence, cigarettes, uncomplicated: Secondary | ICD-10-CM | POA: Diagnosis not present

## 2022-11-29 DIAGNOSIS — Z79899 Other long term (current) drug therapy: Secondary | ICD-10-CM | POA: Diagnosis not present

## 2022-11-29 DIAGNOSIS — Z3480 Encounter for supervision of other normal pregnancy, unspecified trimester: Secondary | ICD-10-CM

## 2022-11-29 DIAGNOSIS — O4290 Premature rupture of membranes, unspecified as to length of time between rupture and onset of labor, unspecified weeks of gestation: Secondary | ICD-10-CM | POA: Diagnosis present

## 2022-11-29 LAB — RUPTURE OF MEMBRANE (ROM)PLUS: Rom Plus: NEGATIVE

## 2022-11-29 MED ORDER — ACETAMINOPHEN 500 MG PO TABS: 1000.0000 mg | ORAL_TABLET | Freq: Four times a day (QID) | ORAL | Status: DC | PRN

## 2022-11-29 MED ORDER — CALCIUM CARBONATE ANTACID 500 MG PO CHEW: 2.0000 | CHEWABLE_TABLET | ORAL | Status: DC | PRN

## 2022-11-29 NOTE — OB Triage Note (Signed)
Pt arrives due to possible leaking of fluid around 1700 and has only seen a couple of other little amounts of fluid since that time. Pt also states that she is feeling ctx around every 7 minutes.

## 2022-11-30 NOTE — Discharge Summary (Signed)
Stefanie Sparks is a 27 y.o. female. She is at 69w0dgestation. Patient's last menstrual period was 03/10/2022. 12/28/2022, by Ultrasound   Prenatal care site:  [facility] Chief complaint: LOF  HPI: Pearla presents to L&D with complaints of leakage of fluid around 1700.  She felt an initial gush and then small amounts of fluid since.  She reports contractions that are mild about every 7 minutes.  Denies vaginal bleeding.  Endorses good fetal movement.  Factors complicating pregnancy: Anemia in pregnancy   S: Resting comfortably, no VB,  Active fetal movement.   Maternal Medical History:  Past Medical Hx:  has a past medical history of Chronic pelvic pain in female (10/02/2018), Endometriosis, Gastroenteritis (04/06/2016), History of colposcopy, Migraine, and Vaginal Pap smear, abnormal.    Past Surgical Hx:  has a past surgical history that includes Wisdom tooth extraction and Dilation and evacuation (N/A, 09/20/2021).   Allergies  Allergen Reactions   Other Other (See Comments)    oranges Uncoded Allergy. Allergen: Shellfish, Other Reaction: Other reaction     Prior to Admission medications   Medication Sig Start Date End Date Taking? Authorizing Provider  cyclobenzaprine (FLEXERIL) 10 MG tablet Take 1 tablet (10 mg total) by mouth 3 (three) times daily as needed for muscle spasms. 11/01/22   NCaren Macadam MD  ferrous sulfate (FERROUSUL) 325 (65 FE) MG tablet Take 1 tablet (325 mg total) by mouth every other day. 10/23/22   Anyanwu, USallyanne Havers MD  nicotine polacrilex (COMMIT) 2 MG lozenge Take 1 lozenge (2 mg total) by mouth as needed for smoking cessation. Patient not taking: Reported on 11/17/2022 09/02/22   NCaren Macadam MD  ondansetron (ZOFRAN-ODT) 4 MG disintegrating tablet Take 1 tablet (4 mg total) by mouth every 6 (six) hours as needed for nausea. Patient not taking: Reported on 11/17/2022 05/14/22   PDonnamae Jude MD  pantoprazole  (PROTONIX) 40 MG tablet Take 1 tablet (40 mg total) by mouth daily. 09/16/22   Anyanwu, USallyanne Havers MD  Prenatal Vit-Fe Fumarate-FA (MULTIVITAMIN-PRENATAL) 27-0.8 MG TABS tablet Take 1 tablet by mouth daily at 12 noon.    [provider]  sucralfate (CARAFATE) 1 GM/10ML suspension Take by mouth. 10/17/22 10/17/23  [provider]    Social History: She  reports that she has been smoking cigarettes. She has a 2.25 pack-year smoking history. She has never used smokeless tobacco. She reports that she does not currently use alcohol. She reports that she does not currently use drugs after having used the following drugs: Marijuana.  Family History: family history includes Asthma in her brother and son; Bipolar disorder in her brother and father; Breast cancer in her maternal great-grandmother; Diabetes in her maternal grandmother; Endometriosis in her maternal grandmother and mother; Seizures in her son.  Review of Systems: A full review of systems was performed and negative except as noted in the HPI.     Pertinent Results:  Prenatal Labs: Blood type/Rh O/Positive/-- (08/22 1613)   Antibody screen Negative    Rubella 2.45 (08/22 1613)   Varicella Unknown  RPR Non Reactive (01/02 1117)   HBsAg Negative (08/22 1613)  Hep C NR   HIV Non Reactive (01/02 1117)   GC neg  Chlamydia neg  Genetic screening cfDNA negative   2 hour GTT 83, 122, 89  3 hour GTT N/A  GBS Unknown      O:  BP 121/71 (BP Location: Left Arm)   Pulse 97  Temp 98.7 F (37.1 C) (Oral)   Resp 18   Ht 4' 11"$  (1.499 m)   Wt 70.3 kg   LMP 03/10/2022   BMI 31.31 kg/m  Results for orders placed or performed during the hospital encounter of 11/29/22 (from the past 48 hour(s))  ROM Plus ([place] only)   Collection Time: 11/29/22  9:45 PM  Result Value Ref Range   Rom Plus NEGATIVE      Constitutional: NAD, AAOx3  HE/ENT: extraocular movements grossly intact, moist mucous membranes CV: RRR PULM: nl  respiratory effort Abd: gravid, non-tender, non-distended, soft  Ext: Non-tender, Nonedmeatous Psych: mood appropriate, speech normal SVE: Dilation: 2.5 Effacement (%): 60 Cervical Position: Anterior Station: -1 Presentation: Vertex Exam by:: Gae Gallop, RN  -SVE unchanged  NST: Baseline FHR: 135 beats/min Variability: moderate Accelerations: present Decelerations: absent Tocometry: Irregular, mild contractions   Interpretation:  INDICATIONS: rule out uterine contractions RESULTS:  A NST procedure was performed with FHR monitoring and a normal baseline established, appropriate time of 20-40 minutes of evaluation, and accels >2 seen w 15x15 characteristics.  Results show a REACTIVE NST.   Assessment: 27 y.o. [id number] 41w0d3/06/2023, by Ultrasound   Principle diagnosis: Leakage of amniotic fluid [O42.90]   Plan: 1) Reactive NST  -Category 1 tracing  -Reassuring fetal status   2) Labor: Not present  -ROM + negative  -No continued LOF  -Discussed warning signs to return to L&D triage with  -Reviewed comfort measures   3) Disposition: discharge home stable -Precautions reviewed  -Follow up as scheduled with prenatal provider   ----- ADrinda Butts CNM Certified Nurse Midwife [facility]

## 2022-12-04 ENCOUNTER — Ambulatory Visit (INDEPENDENT_AMBULATORY_CARE_PROVIDER_SITE_OTHER): Payer: Medicaid Other | Admitting: Family Medicine

## 2022-12-04 ENCOUNTER — Encounter: Payer: Self-pay | Admitting: Family Medicine

## 2022-12-04 ENCOUNTER — Other Ambulatory Visit (HOSPITAL_COMMUNITY)
Admission: RE | Admit: 2022-12-04 | Discharge: 2022-12-04 | Disposition: A | Discharge status: Home / Self Care | Payer: Medicaid Other | Source: Ambulatory Visit | Attending: Family Medicine | Admitting: Family Medicine

## 2022-12-04 VITALS — BP 114/77 | HR 98 | Wt 156.0 lb

## 2022-12-04 DIAGNOSIS — Z3480 Encounter for supervision of other normal pregnancy, unspecified trimester: Secondary | ICD-10-CM | POA: Insufficient documentation

## 2022-12-04 DIAGNOSIS — Z3483 Encounter for supervision of other normal pregnancy, third trimester: Secondary | ICD-10-CM

## 2022-12-04 DIAGNOSIS — Z3A36 36 weeks gestation of pregnancy: Secondary | ICD-10-CM

## 2022-12-04 NOTE — Progress Notes (Signed)
ROB   GBS today.   Pt will like cervix check today.

## 2022-12-04 NOTE — Progress Notes (Signed)
   PRENATAL VISIT NOTE  Subjective:  Stefanie Sparks is a 27 y.o. [phone] at [redacted]w[redacted]d being seen today for ongoing prenatal care.  She is currently monitored for the following issues for this low-risk pregnancy and has Tobacco use disorder; Dysplasia of cervix, low grade (CIN 1); Supervision of other normal pregnancy, antepartum; and Anemia during pregnancy in third trimester on their problem list.  Patient reports no complaints.  Contractions: Irritability. Vag. Bleeding: None.  Movement: Present. Denies leaking of fluid.   The following portions of the patient's history were reviewed and updated as appropriate: allergies, current medications, past family history, past medical history, past social history, past surgical history and problem list.   Objective:   Vitals:   12/04/22 0858  BP: 114/77  Pulse: 98  Weight: 156 lb (70.8 kg)    Fetal Status: Fetal Heart Rate (bpm): 154 Fundal Height: 34 cm Movement: Present  Presentation: Vertex  General:  Alert, oriented and cooperative. Patient is in no acute distress.  Skin: Skin is warm and dry. No rash noted.   Cardiovascular: Normal heart rate noted  Respiratory: Normal respiratory effort, no problems with respiration noted  Abdomen: Soft, gravid, appropriate for gestational age.  Pain/Pressure: Present     Pelvic: Cervical exam performed in the presence of a chaperone Dilation: 2 Effacement (%): 40 Station: -2, -3  Extremities: Normal range of motion.  Edema: None  Mental Status: Normal mood and affect. Normal behavior. Normal judgment and thought content.   Assessment and Plan:  Pregnancy: [id number] at [redacted]w[redacted]d 1. Supervision of other normal pregnancy, antepartum PTL precautions Cultures today - Cervicovaginal ancillary only - Strep Gp B NAA  Preterm labor symptoms and general obstetric precautions including but not limited to vaginal bleeding, contractions, leaking of fluid and fetal movement were reviewed in detail with the  patient. Please refer to After Visit Summary for other counseling recommendations.   Return in 1 week (on 12/11/2022).  Future Appointments  Date Time Provider Department Center  12/11/2022 10:35 AM Donnamae Jude, MD [facility]  12/18/2022  8:55 AM Donnamae Jude, MD [facility]  12/25/2022  8:55 AM Donnamae Jude, MD [facility]  01/01/2023  8:55 AM Donnamae Jude, MD [facility]    Donnamae Jude, MD

## 2022-12-05 LAB — CERVICOVAGINAL ANCILLARY ONLY
Chlamydia: NEGATIVE
Comment: NEGATIVE
Comment: NORMAL
Neisseria Gonorrhea: NEGATIVE

## 2022-12-06 ENCOUNTER — Encounter: Payer: Self-pay | Admitting: Family Medicine

## 2022-12-06 ENCOUNTER — Encounter: Payer: Self-pay | Admitting: Obstetrics and Gynecology

## 2022-12-06 ENCOUNTER — Observation Stay
Admission: EM | Admit: 2022-12-06 | Discharge: 2022-12-06 | Disposition: A | Discharge status: Home / Self Care | Payer: Medicaid Other | Attending: Obstetrics and Gynecology | Admitting: Obstetrics and Gynecology

## 2022-12-06 DIAGNOSIS — Z3A36 36 weeks gestation of pregnancy: Secondary | ICD-10-CM | POA: Insufficient documentation

## 2022-12-06 DIAGNOSIS — O26893 Other specified pregnancy related conditions, third trimester: Secondary | ICD-10-CM | POA: Diagnosis present

## 2022-12-06 DIAGNOSIS — R102 Pelvic and perineal pain: Secondary | ICD-10-CM | POA: Diagnosis not present

## 2022-12-06 LAB — WET PREP, GENITAL
Clue Cells Wet Prep HPF POC: NONE SEEN
Sperm: NONE SEEN
Trich, Wet Prep: NONE SEEN
WBC, Wet Prep HPF POC: <10 (ref <10)
Yeast Wet Prep HPF POC: NONE SEEN

## 2022-12-06 LAB — URINALYSIS, ROUTINE W REFLEX MICROSCOPIC
Bilirubin Urine: NEGATIVE
Glucose, UA: NEGATIVE mg/dL
Hgb urine dipstick: NEGATIVE
Ketones, ur: NEGATIVE mg/dL
Leukocytes,Ua: NEGATIVE
Nitrite: NEGATIVE
Protein, ur: NEGATIVE mg/dL
Specific Gravity, Urine: 1.012 (ref 1.005–1.030)
pH: 6 (ref 5.0–8.0)

## 2022-12-06 LAB — STREP GP B NAA: Strep Gp B NAA: NEGATIVE

## 2022-12-06 NOTE — Discharge Summary (Signed)
Patient ID: Stefanie Sparks MRN: [id number] DOB/AGE: 29-13-1997 29 y.o.  Admit date: 12/06/2022 Discharge date: 12/06/2022  Admission Diagnoses: 27yo G6P3 at 49w6dpresents with intense pelvic pain. She also noted streaks of blood in her vaginal mucous. Denies UCs.  Discharge Diagnoses: Normal discomforts of pregnancy  Factors complicating pregnancy: Anemia  Prenatal Procedures: NST  Consults: None  Significant Diagnostic Studies:  Results for orders placed or performed during the hospital encounter of 12/06/22 (from the past 168 hour(s))  Wet prep, genital   Collection Time: 12/06/22  2:31 PM  Result Value Ref Range   Yeast Wet Prep HPF POC NONE SEEN NONE SEEN   Trich, Wet Prep NONE SEEN NONE SEEN   Clue Cells Wet Prep HPF POC NONE SEEN NONE SEEN   WBC, Wet Prep HPF POC <10 <10   Sperm NONE SEEN   Urinalysis, Routine w reflex microscopic -Urine, Clean Catch   Collection Time: 12/06/22  2:31 PM  Result Value Ref Range   Color, Urine YELLOW (A) YELLOW   APPearance HAZY (A) CLEAR   Specific Gravity, Urine 1.012 1.005 - 1.030   pH 6.0 5.0 - 8.0   Glucose, UA NEGATIVE NEGATIVE mg/dL   Hgb urine dipstick NEGATIVE NEGATIVE   Bilirubin Urine NEGATIVE NEGATIVE   Ketones, ur NEGATIVE NEGATIVE mg/dL   Protein, ur NEGATIVE NEGATIVE mg/dL   Nitrite NEGATIVE NEGATIVE   Leukocytes,Ua NEGATIVE NEGATIVE  Results for orders placed or performed in visit on 12/04/22 (from the past 168 hour(s))  Cervicovaginal ancillary only   Collection Time: 12/04/22  9:15 AM  Result Value Ref Range   Neisseria Gonorrhea Negative    Chlamydia Negative    Comment Normal Reference [place] Chlamydia - Negative    Comment      Normal Reference Range Neisseria Gonorrhea - Negative  Strep Gp B NAA   Collection Time: 12/04/22 10:00 AM   Specimen: Vaginal/Rectal; Genital   VR  Result Value Ref Range   Strep Gp B NAA Negative Negative  Results for orders placed or performed during the hospital encounter of  11/29/22 (from the past 168 hour(s))  ROM Plus ([facility] only)   Collection Time: 11/29/22  9:45 PM  Result Value Ref Range   Rom Plus NEGATIVE     Treatments: none  Hospital Course:  This is a 27y.o. Stefanie Sparks:[id number] IUP at 37w6deen for pelvic pain and streaks of blood in her vaginal mucus, noted to have a cervical exam of 2/TH/-3.  No leaking of fluid and no UCs.  She was observed, fetal heart rate monitoring remained reassuring, and she had no signs/symptoms of preterm labor or other maternal-fetal concerns.  Her cervical exam was unchanged from admission. Lab results noted above.  She was deemed stable for discharge to home with outpatient follow up.  Discharge Physical Exam:  BP 109/60   Pulse 88   Temp 98.1 F (36.7 C) (Oral)   Resp 16   LMP 03/10/2022   General: NAD CV: RRR Pulm: CTABL, nl effort ABD: s/nd/nt, gravid DVT Evaluation: LE non-ttp, no evidence of DVT on exam.  NST: FHR baseline: 120 bpm Variability: moderate Accelerations: yes Decelerations: none Category/reactivity: reactive  TOCO: infrequent  SVE:  Dilation: 2 Effacement (%): Thick Cervical Position: Middle Station: -3 Presentation: Vertex Exam by:: Po[place]arton HillsN   Discharge Condition: Stable  Disposition: Discharge disposition: 01-Home or Self Care        Allergies as of 12/06/2022       Reactions  Other Other (See Comments)   oranges Uncoded Allergy. Allergen: Shellfish, Other Reaction: Other reaction        Medication List     STOP taking these medications    nicotine polacrilex 2 MG lozenge Commonly known as: COMMIT   ondansetron 4 MG disintegrating tablet Commonly known as: ZOFRAN-ODT       TAKE these medications    cyclobenzaprine 10 MG tablet Commonly known as: FLEXERIL Take 1 tablet (10 mg total) by mouth 3 (three) times daily as needed for muscle spasms.   ferrous sulfate 325 (65 FE) MG tablet Commonly known as: FerrouSul Take 1 tablet (325 mg total) by mouth  every other day.   multivitamin-prenatal 27-0.8 MG Tabs tablet Take 1 tablet by mouth daily at 12 noon.   pantoprazole 40 MG tablet Commonly known as: PROTONIX TAKE 1 TABLET BY MOUTH EVERY DAY   sucralfate 1 GM/10ML suspension Commonly known as: CARAFATE Take by mouth.         SignedLinda Hedges, CNM 12/06/2022 4:59 PM

## 2022-12-06 NOTE — OB Triage Note (Addendum)
G4P3 at 36.6 weeks c/o increased discharge starting last night. Today she reports having streaks of blood in it.  Swabs done in office on Wed. No itching, smell or color to discharge.  Last intercourse 1 week ago.  She also reports constant burning in pelvis that "feels like a blow torch on vagina."  She describes the pain as a constant sore, aching pain directly over symphysis pubis.  Has taken tylenol and baths for pain.  Currently taking flexeril for hip and sciatica pain.      Has been having ctx.  No other concerns reported.

## 2022-12-11 ENCOUNTER — Encounter: Payer: Self-pay | Admitting: Family Medicine

## 2022-12-11 ENCOUNTER — Ambulatory Visit (INDEPENDENT_AMBULATORY_CARE_PROVIDER_SITE_OTHER): Payer: Medicaid Other | Admitting: Family Medicine

## 2022-12-11 VITALS — BP 110/72 | HR 97 | Wt 159.0 lb

## 2022-12-11 DIAGNOSIS — O26893 Other specified pregnancy related conditions, third trimester: Secondary | ICD-10-CM | POA: Diagnosis not present

## 2022-12-11 DIAGNOSIS — O99013 Anemia complicating pregnancy, third trimester: Secondary | ICD-10-CM

## 2022-12-11 DIAGNOSIS — R102 Pelvic and perineal pain: Secondary | ICD-10-CM

## 2022-12-11 DIAGNOSIS — Z3480 Encounter for supervision of other normal pregnancy, unspecified trimester: Secondary | ICD-10-CM

## 2022-12-11 DIAGNOSIS — Z3A37 37 weeks gestation of pregnancy: Secondary | ICD-10-CM | POA: Diagnosis not present

## 2022-12-11 NOTE — Progress Notes (Signed)
CC: routine OB  Recent ER visit at [facility] 2.16.24 for pelvic pain

## 2022-12-11 NOTE — Progress Notes (Signed)
    PRENATAL VISIT NOTE  Subjective:  Stefanie Sparks is a 27 y.o. [phone] at 74w4dbeing seen today for ongoing prenatal care.  She is currently monitored for the following issues for this low-risk pregnancy and has Tobacco use disorder; Dysplasia of cervix, low grade (CIN 1); Supervision of other normal pregnancy, antepartum; Anemia during pregnancy in third trimester; and Pelvic pain affecting pregnancy in third trimester, antepartum on their problem list.  Patient reports no complaints.  Contractions: Irregular. Vag. Bleeding: None.  Movement: Present. Denies leaking of fluid.   The following portions of the patient's history were reviewed and updated as appropriate: allergies, current medications, past family history, past medical history, past social history, past surgical history and problem list.   Objective:   Vitals:   12/11/22 1034  BP: 110/72  Pulse: 97  Weight: 159 lb (72.1 kg)    Fetal Status: Fetal Heart Rate (bpm): 141 Fundal Height: 37 cm Movement: Present  Presentation: Vertex  General:  Alert, oriented and cooperative. Patient is in no acute distress.  Skin: Skin is warm and dry. No rash noted.   Cardiovascular: Normal heart rate noted  Respiratory: Normal respiratory effort, no problems with respiration noted  Abdomen: Soft, gravid, appropriate for gestational age.  Pain/Pressure: Present     Pelvic: Cervical exam deferred        Extremities: Normal range of motion.  Edema: Trace  Mental Status: Normal mood and affect. Normal behavior. Normal judgment and thought content.   Assessment and Plan:  Pregnancy: [id number] [redacted]w[redacted]d. Supervision of other normal pregnancy, antepartum Continue routine prenatal care.   2. Pelvic pain affecting pregnancy in third trimester, antepartum Likely related to end of pregnancy  Term labor symptoms and general obstetric precautions including but not limited to vaginal bleeding, contractions, leaking of fluid and fetal movement  were reviewed in detail with the patient. Please refer to After Visit Summary for other counseling recommendations.   Return in 1 week (on 12/18/2022).  Future Appointments  Date Time Provider Department Center  12/18/2022  8:55 AM PrDonnamae JudeMD [facility]  12/25/2022  8:55 AM PrDonnamae JudeMD [facility]  01/01/2023  8:55 AM PrDonnamae JudeMD [facility]    TaDonnamae JudeMD

## 2022-12-14 ENCOUNTER — Encounter (HOSPITAL_COMMUNITY): Payer: Self-pay | Admitting: Obstetrics & Gynecology

## 2022-12-14 ENCOUNTER — Other Ambulatory Visit: Payer: Self-pay

## 2022-12-14 ENCOUNTER — Inpatient Hospital Stay (HOSPITAL_COMMUNITY)
Admission: AD | Admit: 2022-12-14 | Discharge: 2022-12-14 | Disposition: A | Discharge status: Home / Self Care | Payer: Medicaid Other | Attending: Obstetrics & Gynecology | Admitting: Obstetrics & Gynecology

## 2022-12-14 DIAGNOSIS — O471 False labor at or after 37 completed weeks of gestation: Secondary | ICD-10-CM | POA: Insufficient documentation

## 2022-12-14 DIAGNOSIS — Z3A38 38 weeks gestation of pregnancy: Secondary | ICD-10-CM | POA: Diagnosis not present

## 2022-12-14 DIAGNOSIS — O479 False labor, unspecified: Secondary | ICD-10-CM | POA: Insufficient documentation

## 2022-12-14 MED ORDER — LACTATED RINGERS IV BOLUS
1000.0000 mL | Freq: Once | INTRAVENOUS | Status: AC
  Administered 2022-12-14: 1000 mL via INTRAVENOUS

## 2022-12-14 NOTE — MAU Provider Note (Signed)
S: Ms. Stefanie Sparks is a 27 y.o. [id number] at [redacted]w[redacted]d who presents to MAU today complaining contractions since early this AM. She denies vaginal bleeding. She denies LOF. She reports normal fetal movement.    O: BP 111/71   Pulse (!) 104   Temp 97.8 F (36.6 C) (Oral)   Resp 17   LMP 03/10/2022  GENERAL: Well-developed, well-nourished female in no acute distress.  HEAD: Normocephalic, atraumatic.  CHEST: Normal effort of breathing, regular heart rate ABDOMEN: Soft, nontender, gravid  Cervical exam:  Dilation: 2.5 Effacement (%): 50 Cervical Position: Posterior Station: -1 Presentation: Vertex Exam by:: K. Cowher RN   Fetal Monitoring: Baseline: 145 bpm Variability: moderate Accelerations: 15x15 Decelerations: some late and vatiables Contractions:  irregular   A: SIUP at 378w0dFalse labor  P: Precautions given, s/ p IVB FHT improvement and no decels. SVE unchanged   [name] 12/14/2022 3:48 PM

## 2022-12-14 NOTE — Progress Notes (Signed)
Stefanie Sparks is a 27 y.o. at 12w0dhere in MAU reporting: ctx's that started early this morning. Denies any LOF, VB and reports +FM.    Cervical exam 2 weeks ago 2-3cm   Pain score: 6 Vitals:   12/14/22 1346 12/14/22 1352  BP: 108/72 111/71  Pulse: (!) 122 (!) 104  Resp: 17 17  Temp: 97.8 F (36.6 C)      FHT:150 Lab orders placed from triage:  n/a

## 2022-12-15 ENCOUNTER — Inpatient Hospital Stay: Payer: Medicaid Other | Admitting: Anesthesiology

## 2022-12-15 ENCOUNTER — Encounter: Payer: Self-pay | Admitting: Obstetrics and Gynecology

## 2022-12-15 ENCOUNTER — Inpatient Hospital Stay
Admission: EM | Admit: 2022-12-15 | Discharge: 2022-12-17 | DRG: 807 | Disposition: A | Discharge status: Home / Self Care | Payer: Medicaid Other | Attending: Obstetrics | Admitting: Obstetrics

## 2022-12-15 ENCOUNTER — Other Ambulatory Visit: Payer: Self-pay

## 2022-12-15 DIAGNOSIS — O99334 Smoking (tobacco) complicating childbirth: Secondary | ICD-10-CM | POA: Diagnosis present

## 2022-12-15 DIAGNOSIS — Z3A38 38 weeks gestation of pregnancy: Secondary | ICD-10-CM

## 2022-12-15 DIAGNOSIS — Z87410 Personal history of cervical dysplasia: Secondary | ICD-10-CM | POA: Diagnosis not present

## 2022-12-15 DIAGNOSIS — O9902 Anemia complicating childbirth: Principal | ICD-10-CM | POA: Diagnosis present

## 2022-12-15 DIAGNOSIS — O26893 Other specified pregnancy related conditions, third trimester: Secondary | ICD-10-CM | POA: Diagnosis present

## 2022-12-15 DIAGNOSIS — F17210 Nicotine dependence, cigarettes, uncomplicated: Secondary | ICD-10-CM | POA: Diagnosis present

## 2022-12-15 LAB — CBC
HCT: 29.9 % — ABNORMAL LOW (ref 36.0–46.0)
Hemoglobin: 10.1 g/dL — ABNORMAL LOW (ref 12.0–15.0)
MCH: 28.3 pg (ref 26.0–34.0)
MCHC: 33.8 g/dL (ref 30.0–36.0)
MCV: 83.8 fL (ref 80.0–100.0)
Platelets: 247 10*3/uL (ref 150–400)
RBC: 3.57 MIL/uL — ABNORMAL LOW (ref 3.87–5.11)
RDW: 13.2 % (ref 11.5–15.5)
WBC: 15 10*3/uL — ABNORMAL HIGH (ref 4.0–10.5)
nRBC: 0.1 % (ref 0.0–0.2)

## 2022-12-15 LAB — TYPE AND SCREEN
ABO/RH(D): O POS
Antibody Screen: NEGATIVE

## 2022-12-15 MED ORDER — EPHEDRINE 5 MG/ML INJ
10.0000 mg | INTRAVENOUS | Status: DC | PRN
  Administered 2022-12-15: 10 mg via INTRAVENOUS
  Filled 2022-12-15: qty 5

## 2022-12-15 MED ORDER — OXYTOCIN 10 UNIT/ML IJ SOLN
INTRAMUSCULAR | Status: AC
  Filled 2022-12-15: qty 2

## 2022-12-15 MED ORDER — OXYTOCIN-SODIUM CHLORIDE 30-0.9 UT/500ML-% IV SOLN
2.5000 [IU]/h | INTRAVENOUS | Status: DC
  Administered 2022-12-16: 2.5 [IU]/h via INTRAVENOUS
  Filled 2022-12-15: qty 500

## 2022-12-15 MED ORDER — FENTANYL-BUPIVACAINE-NACL 0.5-0.125-0.9 MG/250ML-% EP SOLN
EPIDURAL | Status: AC
  Filled 2022-12-15: qty 250

## 2022-12-15 MED ORDER — EPHEDRINE 5 MG/ML INJ
10.0000 mg | INTRAVENOUS | Status: DC | PRN
  Administered 2022-12-15: 10 mg via INTRAVENOUS

## 2022-12-15 MED ORDER — LACTATED RINGERS IV SOLN
500.0000 mL | Freq: Once | INTRAVENOUS | Status: AC
  Administered 2022-12-15: 500 mL via INTRAVENOUS

## 2022-12-15 MED ORDER — ACETAMINOPHEN 325 MG PO TABS: 650.0000 mg | ORAL_TABLET | ORAL | Status: DC | PRN

## 2022-12-15 MED ORDER — TERBUTALINE SULFATE 1 MG/ML IJ SOLN: 0.2500 mg | Freq: Once | INTRAMUSCULAR | Status: DC | PRN

## 2022-12-15 MED ORDER — LIDOCAINE HCL (PF) 1 % IJ SOLN
INTRAMUSCULAR | Status: AC
  Filled 2022-12-15: qty 30

## 2022-12-15 MED ORDER — FENTANYL-BUPIVACAINE-NACL 0.5-0.125-0.9 MG/250ML-% EP SOLN
12.0000 mL/h | EPIDURAL | Status: DC | PRN
  Administered 2022-12-15: 12 mL/h via EPIDURAL

## 2022-12-15 MED ORDER — DIPHENHYDRAMINE HCL 50 MG/ML IJ SOLN: 12.5000 mg | INTRAMUSCULAR | Status: DC | PRN

## 2022-12-15 MED ORDER — OXYTOCIN-SODIUM CHLORIDE 30-0.9 UT/500ML-% IV SOLN
1.0000 m[IU]/min | INTRAVENOUS | Status: DC
  Administered 2022-12-15: 2 m[IU]/min via INTRAVENOUS

## 2022-12-15 MED ORDER — FENTANYL CITRATE (PF) 100 MCG/2ML IJ SOLN: 50.0000 ug | INTRAMUSCULAR | Status: DC | PRN

## 2022-12-15 MED ORDER — OXYTOCIN BOLUS FROM INFUSION
333.0000 mL | Freq: Once | INTRAVENOUS | Status: AC
  Administered 2022-12-16: 333 mL via INTRAVENOUS

## 2022-12-15 MED ORDER — LIDOCAINE-EPINEPHRINE (PF) 1.5 %-1:200000 IJ SOLN
INTRAMUSCULAR | Status: DC | PRN
  Administered 2022-12-15: 2 mL

## 2022-12-15 MED ORDER — AMMONIA AROMATIC IN INHA
RESPIRATORY_TRACT | Status: AC
  Filled 2022-12-15: qty 10

## 2022-12-15 MED ORDER — LACTATED RINGERS IV SOLN: 500.0000 mL | INTRAVENOUS | Status: DC | PRN

## 2022-12-15 MED ORDER — LIDOCAINE-EPINEPHRINE (PF) 1.5 %-1:200000 IJ SOLN
INTRAMUSCULAR | Status: DC | PRN
  Administered 2022-12-15: 3 mL via EPIDURAL

## 2022-12-15 MED ORDER — MISOPROSTOL 200 MCG PO TABS
ORAL_TABLET | ORAL | Status: AC
  Filled 2022-12-15: qty 4

## 2022-12-15 MED ORDER — PHENYLEPHRINE 80 MCG/ML (10ML) SYRINGE FOR IV PUSH (FOR BLOOD PRESSURE SUPPORT)
80.0000 ug | PREFILLED_SYRINGE | INTRAVENOUS | Status: DC | PRN
  Administered 2022-12-15: 80 ug via INTRAVENOUS
  Filled 2022-12-15: qty 10

## 2022-12-15 MED ORDER — PHENYLEPHRINE 80 MCG/ML (10ML) SYRINGE FOR IV PUSH (FOR BLOOD PRESSURE SUPPORT): 80.0000 ug | PREFILLED_SYRINGE | INTRAVENOUS | Status: DC | PRN

## 2022-12-15 MED ORDER — LACTATED RINGERS IV SOLN: INTRAVENOUS | Status: DC

## 2022-12-15 MED ORDER — LIDOCAINE HCL (PF) 1 % IJ SOLN: 30.0000 mL | INTRAMUSCULAR | Status: DC | PRN

## 2022-12-15 MED ORDER — SOD CITRATE-CITRIC ACID 500-334 MG/5ML PO SOLN: 30.0000 mL | ORAL | Status: DC | PRN

## 2022-12-15 MED ORDER — ONDANSETRON HCL 4 MG/2ML IJ SOLN
4.0000 mg | Freq: Four times a day (QID) | INTRAMUSCULAR | Status: DC | PRN
  Administered 2022-12-15: 4 mg via INTRAVENOUS
  Filled 2022-12-15: qty 2

## 2022-12-15 NOTE — Anesthesia Preprocedure Evaluation (Signed)
Anesthesia Evaluation  Patient identified by MRN, date of birth, ID band Patient awake    Reviewed: Allergy & Precautions, H&P , NPO status , Patient's Chart, lab work & pertinent test results  Airway Mallampati: II  TM Distance: >3 FB Neck ROM: full    Dental no notable dental hx.    Pulmonary neg pulmonary ROS   Pulmonary exam normal        Cardiovascular Exercise Tolerance: Good negative cardio ROS Normal cardiovascular exam     Neuro/Psych    GI/Hepatic negative GI ROS,,,  Endo/Other    Renal/GU   negative genitourinary   Musculoskeletal   Abdominal   Peds  Hematology negative hematology ROS (+)   Anesthesia Other Findings Past Medical History: 10/02/2018: Chronic pelvic pain in female No date: Endometriosis 04/06/2016: Gastroenteritis No date: History of colposcopy No date: Migraine No date: Vaginal Pap smear, abnormal  Past Surgical History: 09/20/2021: DILATION AND EVACUATION; N/A     Comment:  Procedure: DILATATION AND EVACUATION;  Surgeon: Gae Dry, MD;  Location: [facility] ORS;  Service: Gynecology;               Laterality: N/A; No date: WISDOM TOOTH EXTRACTION     Reproductive/Obstetrics (+) Pregnancy                             Anesthesia Physical Anesthesia Plan  ASA: 2  Anesthesia Plan: Epidural   Post-op Pain Management:    Induction:   PONV Risk Score and Plan:   Airway Management Planned:   Additional Equipment:   Intra-op Plan:   Post-operative Plan:   Informed Consent: I have reviewed the patients History and Physical, chart, labs and discussed the procedure including the risks, benefits and alternatives for the proposed anesthesia with the patient or authorized representative who has indicated his/her understanding and acceptance.       Plan Discussed with: Anesthesiologist and CRNA  Anesthesia Plan Comments:         Anesthesia Quick Evaluation

## 2022-12-15 NOTE — H&P (Signed)
OB History & Physical   History of Present Illness:   Chief Complaint: uterine contractions  HPI:  Katheline ALBIE COLBECK is a 27 y.o. [phone] female at [redacted]w[redacted]d Patient's last menstrual period was 03/10/2022., not consistent with [place] with Estimated Date of Delivery: 12/28/22.  She presents to L&D for normal labor  Reports active fetal movement  Contractions: every 2 to 3 minutes LOF/SROM: AROM clear Vaginal bleeding: none  Factors complicating pregnancy:  Anemia Hx of colposcopy  Tobacco use  Patient Active Problem List   Diagnosis Date Noted   Normal labor and delivery 12/15/2022   False labor 12/14/2022   Pelvic pain affecting pregnancy in third trimester, antepartum 12/06/2022   Anemia during pregnancy in third trimester 10/23/2022   Supervision of other normal pregnancy, antepartum 05/08/2022   Dysplasia of cervix, low grade (CIN 1) 03/14/2022   Tobacco use disorder 05/07/2020    Prenatal Transfer Tool  Maternal Diabetes: No Genetic Screening: Normal Maternal Ultrasounds/Referrals: Normal Fetal Ultrasounds or other Referrals:  None Maternal Substance Abuse:  No Significant Maternal Medications:  None Significant Maternal Lab Results: Group B Strep negative  Maternal Medical History:   Past Medical History:  Diagnosis Date   Chronic pelvic pain in female 10/02/2018   Endometriosis    Gastroenteritis 04/06/2016   History of colposcopy    Migraine    Vaginal Pap smear, abnormal     Past Surgical History:  Procedure Laterality Date   DILATION AND EVACUATION N/A 09/20/2021   Procedure: DILATATION AND EVACUATION;  Surgeon: HGae Dry MD;  Location: [facility] ORS;  Service: Gynecology;  Laterality: N/A;   WISDOM TOOTH EXTRACTION      Allergies  Allergen Reactions   Other Other (See Comments)    oranges Uncoded Allergy. Allergen: Shellfish, Other Reaction: Other reaction    Prior to Admission medications   Medication Sig Start Date End Date Taking? Authorizing  Provider  cyclobenzaprine (FLEXERIL) 10 MG tablet Take 1 tablet (10 mg total) by mouth 3 (three) times daily as needed for muscle spasms. 11/01/22   NCaren Macadam MD  ferrous sulfate (FERROUSUL) 325 (65 FE) MG tablet Take 1 tablet (325 mg total) by mouth every other day. 10/23/22   Anyanwu, USallyanne Havers MD  pantoprazole (PROTONIX) 40 MG tablet TAKE 1 TABLET BY MOUTH EVERY DAY 12/02/22   Anyanwu, USallyanne Havers MD  Prenatal Vit-Fe Fumarate-FA (MULTIVITAMIN-PRENATAL) 27-0.8 MG TABS tablet Take 1 tablet by mouth daily at 12 noon.    [provider]  sucralfate (CARAFATE) 1 GM/10ML suspension Take by mouth. 10/17/22 10/17/23  [provider]     [facility]  Gravida Para Term Preterm AB Living  '6 3 3 '$ 0 2 3  SAB IAB Ectopic Multiple Live Births  2 0 0 0 3    # Outcome Date GA Lbr Len/2nd Weight Sex Delivery Anes PTL Lv  6 Current           5 SAB 01/2022 488w0d       4 SAB 07/2021 4w77w6d      3 Term 12/16/20 [id number]:13 3580 g F Vag-Spont EPI  LIV     Name: Hopkins,GIRL Katura     Apgar1: 8  Apgar5: 9  2 Term 04/24/16 [id number]:53 3220 g M Vag-Spont EPI  LIV     Name: WILSOJuanda ChanceApgar1: 8  Apgar5: 9  1 Term 11/03/10 88w0d72w0d  g F Vag-Spont None  LIV     Name: Minette Brine     Social History: She  reports that she has been smoking cigarettes. She has a 2.25 pack-year smoking history. She has never used smokeless tobacco. She reports that she does not currently use alcohol. She reports that she does not currently use drugs after having used the following drugs: Marijuana.  Family History: family history includes Asthma in her brother and son; Bipolar disorder in her brother and father; Breast cancer in her maternal great-grandmother; Diabetes in her maternal grandmother; Endometriosis in her maternal grandmother and mother; Seizures in her son.   Review of Systems: A full review of systems was performed and negative except as  noted in the HPI.     Physical Exam:  Vital Signs: LMP 03/10/2022   General: no acute distress.  HEENT: normocephalic, atraumatic Heart: regular rate & rhythm Lungs: normal respiratory effort Abdomen: soft, gravid, non-tender;  Pelvic:   External: Normal external female genitalia  Cervix:   /   /      Extremities: non-tender, symmetric,  edema bilaterally.  DTRs: +2  Neurologic: Alert & oriented x 3.    No results found for this or any previous visit (from the past 24 hour(s)).  Pertinent Results:  Prenatal Labs: Blood type/Rh O   Antibody screen Negative    Rubella 2.45 (08/22 1613)   Varicella Immune  RPR Non Reactive (01/02 1117)   HBsAg Negative (08/22 1613)  Hep C NR   HIV Non Reactive (01/02 1117)   GC neg  Chlamydia neg  Genetic screening cfDNA negative   2 hour GTT 83,122,89  3 hour GTT N/a  GBS Negative/-- (02/14 1000)    FHT:  FHR: 145 bpm, variability: moderate,  accelerations:  Present,  decelerations:  Absent Category/reactivity:  Category I UC:   regular, every 2-3 minutes   Cephalic by SVE   US OB Limited  Result Date: 11/17/2022 CLINICAL DATA:  Decreased fetal movement EXAM: LIMITED OBSTETRIC ULTRASOUND COMPARISON:  None Available. FINDINGS: Number of Fetuses: 1 Heart Rate:  137 bpm Movement: Yes Presentation: Cephalic Placental Location: Anterior fundal Previa: No Amniotic Fluid (Subjective):  Normal AFI: 13.2 cm BPD: 8.71 cm 35 w  1 d MATERNAL FINDINGS: Cervix:  Closed.  3.5 cm in length. Uterus/Adnexae: No abnormality visualized. IMPRESSION: Single live IUP as above. Fetal movement was identified by the sonographer. This exam is performed on an emergent basis and does not comprehensively evaluate fetal size, dating, or anatomy; follow-up complete OB US should be considered if further fetal assessment is warranted. Electronically Signed   By: Dorise Bullion III M.D.   On: 11/17/2022 17:08    Assessment:  Jaziya KAYLONIE BLANDFORD is a 27 y.o. [id number] female  at 59w1dwith normal labor and delivery.   Plan:  1. Admit to Labor & Delivery - consents reviewed and obtained - Dr. SOuida Sills notified of admission and plan of care   2. Fetal Well being  - Fetal Tracing: Category 1 - Group B Streptococcus ppx not indicated: GBS negative - Presentation: Cephalic confirmed by SVE   3. Routine OB: - Prenatal labs reviewed, as above - Rh positive - CBC, T&S, RPR on admit - Clear liquid diet , continuous IV fluids  4. Monitoring of labor  - Contractions monitored with external toco - Pelvis proven to 7.14 lbs adequate for trial of labor  - Plan for expectant management  - Augmentation with AROM as appropriate  - Plan  for  continuous fetal monitoring - Maternal pain control as desired; planning regional anesthesia - Anticipate vaginal delivery  5. Post Partum Planning: - Infant feeding: breast feeding - Contraception: IUD - Tdap vaccine: Given prenatally - Flu vaccine:  declined  Brylin Stopper Marlaine Hind, CNM [id number] PM  Avelino Leeds, CNM Certified Nurse Midwife [facility]

## 2022-12-15 NOTE — Anesthesia Procedure Notes (Signed)
Epidural Patient location during procedure: OB Start time: 12/15/2022 8:37 PM End time: 12/15/2022 8:46 PM  Staffing Anesthesiologist: Iran Ouch, MD Performed: anesthesiologist   Preanesthetic Checklist Completed: patient identified, IV checked, site marked, risks and benefits discussed, surgical consent, monitors and equipment checked, pre-op evaluation and timeout performed  Epidural Patient position: sitting Prep: ChloraPrep Patient monitoring: heart rate, continuous pulse ox and blood pressure Approach: midline Location: L3-L4 Injection technique: LOR saline  Needle:  Needle type: Tuohy  Needle gauge: 18 G Needle length: 9 cm Needle insertion depth: 7 cm Catheter type: closed end Catheter size: 20 Guage Catheter at skin depth: 11 cm Test dose: negative and 1.5% lidocaine with Epi 1:200 K  Assessment Events: blood not aspirated, no cerebrospinal fluid, injection not painful, no injection resistance and no paresthesia  Additional Notes Reason for block:procedure for pain

## 2022-12-16 LAB — RPR: RPR Ser Ql: NONREACTIVE

## 2022-12-16 MED ORDER — BISACODYL 10 MG RE SUPP: 10.0000 mg | Freq: Every day | RECTAL | Status: DC | PRN

## 2022-12-16 MED ORDER — IBUPROFEN 600 MG PO TABS
600.0000 mg | ORAL_TABLET | Freq: Four times a day (QID) | ORAL | Status: DC
  Administered 2022-12-16 – 2022-12-17 (×6): 600 mg via ORAL
  Filled 2022-12-16 (×6): qty 1

## 2022-12-16 MED ORDER — FLEET ENEMA 7-19 GM/118ML RE ENEM: 1.0000 | ENEMA | Freq: Every day | RECTAL | Status: DC | PRN

## 2022-12-16 MED ORDER — ACETAMINOPHEN 325 MG PO TABS: 650.0000 mg | ORAL_TABLET | ORAL | Status: DC | PRN

## 2022-12-16 MED ORDER — SODIUM CHLORIDE 0.9 % IV SOLN: 250.0000 mL | INTRAVENOUS | Status: DC | PRN

## 2022-12-16 MED ORDER — COCONUT OIL OIL: 1.0000 | TOPICAL_OIL | Status: DC | PRN

## 2022-12-16 MED ORDER — DIPHENHYDRAMINE HCL 25 MG PO CAPS: 25.0000 mg | ORAL_CAPSULE | Freq: Four times a day (QID) | ORAL | Status: DC | PRN

## 2022-12-16 MED ORDER — PRENATAL MULTIVITAMIN CH
1.0000 | ORAL_TABLET | Freq: Every day | ORAL | Status: DC
  Administered 2022-12-16: 1 via ORAL
  Filled 2022-12-16: qty 1

## 2022-12-16 MED ORDER — ONDANSETRON HCL 4 MG PO TABS: 4.0000 mg | ORAL_TABLET | ORAL | Status: DC | PRN

## 2022-12-16 MED ORDER — BENZOCAINE-MENTHOL 20-0.5 % EX AERO: 1.0000 | INHALATION_SPRAY | CUTANEOUS | Status: DC | PRN

## 2022-12-16 MED ORDER — SODIUM CHLORIDE 0.9% FLUSH
3.0000 mL | Freq: Two times a day (BID) | INTRAVENOUS | Status: DC
  Administered 2022-12-16 (×2): 3 mL via INTRAVENOUS

## 2022-12-16 MED ORDER — SIMETHICONE 80 MG PO CHEW: 80.0000 mg | CHEWABLE_TABLET | ORAL | Status: DC | PRN

## 2022-12-16 MED ORDER — ZOLPIDEM TARTRATE 5 MG PO TABS: 5.0000 mg | ORAL_TABLET | Freq: Every evening | ORAL | Status: DC | PRN

## 2022-12-16 MED ORDER — DIBUCAINE (PERIANAL) 1 % EX OINT: 1.0000 | TOPICAL_OINTMENT | CUTANEOUS | Status: DC | PRN

## 2022-12-16 MED ORDER — OXYCODONE HCL 5 MG PO TABS: 5.0000 mg | ORAL_TABLET | ORAL | Status: DC | PRN

## 2022-12-16 MED ORDER — SODIUM CHLORIDE 0.9% FLUSH: 3.0000 mL | INTRAVENOUS | Status: DC | PRN

## 2022-12-16 MED ORDER — WITCH HAZEL-GLYCERIN EX PADS: 1.0000 | MEDICATED_PAD | CUTANEOUS | Status: DC | PRN

## 2022-12-16 MED ORDER — ONDANSETRON HCL 4 MG/2ML IJ SOLN: 4.0000 mg | INTRAMUSCULAR | Status: DC | PRN

## 2022-12-16 MED ORDER — SENNOSIDES-DOCUSATE SODIUM 8.6-50 MG PO TABS
2.0000 | ORAL_TABLET | ORAL | Status: DC
  Administered 2022-12-16 – 2022-12-17 (×2): 2 via ORAL
  Filled 2022-12-16 (×2): qty 2

## 2022-12-16 NOTE — Progress Notes (Signed)
Post Partum Day 0 Subjective: Doing well, no complaints.  Tolerating regular diet, pain with PO meds, voiding and ambulating without difficulty.  No CP SOB Fever,Chills, N/V or leg pain; denies nipple or breast pain no HA change of vision, RUQ/epigastric pain  Objective: BP (!) 93/52 (BP Location: Right Arm)   Pulse 77   Temp 98.4 F (36.9 C) (Oral)   Resp 20   LMP 03/10/2022   SpO2 98%    Physical Exam:  General: NAD Breasts: soft/nontender Abdomen: soft, NT, BS x 4 Perineum: minimal edema, intact Lochia: small Uterine Fundus: fundus firm and 1 fb below umbilicus DVT Evaluation: no cords, ttp LEs   Recent Labs    12/15/22 2012  HGB 10.1*  HCT 29.9*  WBC 15.0*  PLT 247    Assessment/Plan: 27 y.o. [id number] postpartum day # 0  - Continue routine PP care - Lactation consult prn - Discussed contraceptive options including implant, IUDs hormonal and non-hormonal, injection, pills/ring/patch, condoms, and NFP. Planning Mirena IUD - delivery EBL minimal, CBC ordered for AM  - Immunization status: all Imms up to date    Disposition: Does not desire Dc home today.     Francetta Found, CNM 12/16/2022  9:51 AM

## 2022-12-16 NOTE — Discharge Summary (Incomplete)
Obstetrical Discharge Summary  Patient Name: Stefanie Sparks DOB: 01-19-1996 MRN: [id number]  Date of Admission: 12/15/2022 Date of Delivery: 12/16/22 Delivered by: Avelino Leeds, CNM  Date of Discharge: 12/16/2022  Primary OB:  Dion Body [id number] last menstrual period was 03/10/2022. EDC Estimated Date of Delivery: 12/28/22 Gestational Age at Delivery: [redacted]w[redacted]d  Antepartum complications:  Anemia Hx of colposcopy  Tobacco use  Admitting Diagnosis: Normal labor and delivery [O80]  Secondary Diagnosis: Patient Active Problem List   Diagnosis Date Noted   Normal labor and delivery 12/15/2022   False labor 12/14/2022   Pelvic pain affecting pregnancy in third trimester, antepartum 12/06/2022   Anemia during pregnancy in third trimester 10/23/2022   Supervision of other normal pregnancy, antepartum 05/08/2022   Dysplasia of cervix, low grade (CIN 1) 03/14/2022   Tobacco use disorder 05/07/2020    Discharge Diagnosis: Term Pregnancy Delivered      Augmentation: AROM and Pitocin Complications: None Intrapartum complications/course: Stefanie Sparks presented to L&D with uterine contractions. She was 5/60/-1. She progressed  to C/C/+2 with a spontaneous urge to push.  She pushed  effectively over approximately 1 minute for a spontaneous vaginal birth.  Delivery Type: spontaneous vaginal delivery Anesthesia: epidural anesthesia Placenta: spontaneous To Pathology: No  Laceration: none Episiotomy: none Newborn Data: Live born female  Birth Weight:   APGAR: 860 990 Newborn Delivery   Birth date/time: 12/16/2022 02:18:20 Delivery type: Vaginal, Spontaneous      Postpartum Procedures: {postpartum procedures:3041411} [place]:     03/08/2021    3:53 PM 12/17/2020   12:10 AM  Edinburgh Postnatal Depression Scale Screening Tool  I have been able to laugh and see the funny side of things. 0 0  I have looked forward with enjoyment to things. 0 0  I have blamed myself unnecessarily  when things went wrong. 1 1  I have been anxious or worried for no good reason. 1 1  I have felt scared or panicky for no good reason. 1 0  Things have been getting on top of me. 0 0  I have been so unhappy that I have had difficulty sleeping. 0 0  I have felt sad or miserable. 0 0  I have been so unhappy that I have been crying. 0 0  The thought of harming myself has occurred to me. 0 0  Edinburgh Postnatal Depression Scale Total 3 2     Post partum course: *** Patient had an uncomplicated postpartum course.  By time of discharge on PPD#***, her pain was controlled on oral pain medications; she had appropriate lochia and was ambulating, voiding without difficulty and tolerating regular diet.  She was deemed stable for discharge to home.    Discharge Physical Exam: *** BP (!) 95/51   Pulse 91   Temp 99 F (37.2 C) (Oral)   Resp 18   LMP 03/10/2022   SpO2 99%   General: NAD CV: RRR Pulm: CTABL, nl effort ABD: s/nd/nt, fundus firm and below the umbilicus Lochia: moderate Perineum:***minimal edema/{OB Perineal assessment:24215} Incision: c/d/I, covered with occlusive OP site dressing *** DVT Evaluation: LE non-ttp, no evidence of DVT on exam.  Hemoglobin  Date Value Ref Range Status  12/15/2022 10.1 (L) 12.0 - 15.0 g/dL Final  10/22/2022 10.3 (L) 11.1 - 15.9 g/dL Final   HCT  Date Value Ref Range Status  12/15/2022 29.9 (L) 36.0 - 46.0 % Final   Hematocrit  Date Value Ref Range Status  10/22/2022 30.8 (L) 34.0 - 46.6 %  Final    Risk assessment for postpartum VTE and prophylactic treatment: Very high risk factors: None High risk factors: None Moderate risk factors: BMI 30-40 kg/m2  Postpartum VTE prophylaxis with LMWH not indicated  Disposition: stable, discharge to home. Baby Feeding: breast feeding Baby Disposition: home with mom  Rh Immune globulin indicated: No Rubella vaccine given: was not indicated Varivax vaccine given: was not indicated Flu vaccine  given in AP setting: No Tdap vaccine given in AP setting: Yes   Contraception: IUD  Prenatal Labs:   Blood type/Rh O   Antibody screen Negative    Rubella 2.45 (08/22 1613)   Varicella Immune  RPR Non Reactive (01/02 1117)   HBsAg Negative (08/22 1613)  Hep C NR   HIV Non Reactive (01/02 1117)   GC neg  Chlamydia neg  Genetic screening cfDNA negative   2 hour GTT 83,122,89  3 hour GTT N/a  GBS Negative/-- (02/14 1000)      Plan:  Stefanie Sparks was discharged to home in good condition. Follow-up appointment with delivering provider in 6 weeks.***  Discharge Medications: Allergies as of 12/16/2022       Reactions   Other Other (See Comments)   oranges Uncoded Allergy. Allergen: Shellfish, Other Reaction: Other reaction     Med Rec must be completed prior to using this [place]***         Signed: *** Hit refresh and delete this line

## 2022-12-17 LAB — CBC
HCT: 25.9 % — ABNORMAL LOW (ref 36.0–46.0)
Hemoglobin: 8.6 g/dL — ABNORMAL LOW (ref 12.0–15.0)
MCH: 28.2 pg (ref 26.0–34.0)
MCHC: 33.2 g/dL (ref 30.0–36.0)
MCV: 84.9 fL (ref 80.0–100.0)
Platelets: 210 10*3/uL (ref 150–400)
RBC: 3.05 MIL/uL — ABNORMAL LOW (ref 3.87–5.11)
RDW: 13.5 % (ref 11.5–15.5)
WBC: 11.8 10*3/uL — ABNORMAL HIGH (ref 4.0–10.5)
nRBC: 0 % (ref 0.0–0.2)

## 2022-12-17 MED ORDER — IBUPROFEN 600 MG PO TABS: 600.0000 mg | ORAL_TABLET | Freq: Four times a day (QID) | ORAL | 0 refills | Status: AC

## 2022-12-17 MED ORDER — WITCH HAZEL-GLYCERIN EX PADS: 1.0000 | MEDICATED_PAD | CUTANEOUS | 0 refills | Status: AC | PRN

## 2022-12-17 MED ORDER — SODIUM CHLORIDE 0.9 % IV SOLN
300.0000 mg | INTRAVENOUS | Status: DC
  Administered 2022-12-17: 300 mg via INTRAVENOUS
  Filled 2022-12-17: qty 300

## 2022-12-17 MED ORDER — BENZOCAINE-MENTHOL 20-0.5 % EX AERO: 1.0000 | INHALATION_SPRAY | CUTANEOUS | 1 refills | Status: AC | PRN

## 2022-12-17 MED ORDER — SENNOSIDES-DOCUSATE SODIUM 8.6-50 MG PO TABS: 2.0000 | ORAL_TABLET | Freq: Every evening | ORAL | 0 refills | Status: AC | PRN

## 2022-12-17 MED ORDER — SODIUM CHLORIDE 0.9 % IV SOLN: INTRAVENOUS | Status: DC

## 2022-12-17 MED ORDER — ACETAMINOPHEN 325 MG PO TABS: 650.0000 mg | ORAL_TABLET | Freq: Four times a day (QID) | ORAL | 0 refills | Status: AC | PRN

## 2022-12-17 NOTE — Lactation Note (Incomplete)
This note was copied from a baby's chart. Lactation Consultation Note  Patient Name: Stefanie Sparks [id number] Date: 12/17/2022 Age:29 hours     Maternal Data    Feeding Nipple Type: Slow - flow  LATCH Score                    Lactation Tools Discussed/Used    Interventions    Discharge    Consult Status      Rodena Medin 12/17/2022, 11:41 AM

## 2022-12-17 NOTE — Discharge Summary (Signed)
Obstetrical Discharge Summary   Patient Name: Stefanie Sparks DOB: 10/19/1996 MRN: [id number]   Date of Admission: 12/15/2022 Date of Delivery: 12/16/22 Delivered by: Avelino Leeds, CNM  Date of Discharge: 12/16/2022   Primary OB:  Dion Body [id number] last menstrual period was 03/10/2022. EDC Estimated Date of Delivery: 12/28/22 Gestational Age at Delivery: [redacted]w[redacted]d   Antepartum complications:  Anemia Hx of colposcopy  Tobacco use Anxiety   Admitting Diagnosis: Normal labor and delivery [O80]  Secondary Diagnosis:     Patient Active Problem List    Diagnosis Date Noted   Normal labor and delivery 12/15/2022   False labor 12/14/2022   Pelvic pain affecting pregnancy in third trimester, antepartum 12/06/2022   Anemia during pregnancy in third trimester 10/23/2022   Supervision of other normal pregnancy, antepartum 05/08/2022   Dysplasia of cervix, low grade (CIN 1) 03/14/2022   Tobacco use disorder 05/07/2020      Discharge Diagnosis: Term Pregnancy Delivered       Augmentation: AROM and Pitocin Complications: None Intrapartum complications/course: Katlin presented to L&D with uterine contractions. She was 5/60/-1. She progressed  to C/C/+2 with a spontaneous urge to push.  She pushed  effectively over approximately 1 minute for a spontaneous vaginal birth.  Delivery Type: spontaneous vaginal delivery Anesthesia: epidural anesthesia Placenta: spontaneous          To Pathology: No  Laceration: none Episiotomy: none Newborn Data: Live born female  Birth Weight:  6lb 10oz APGAR: 84 9   Newborn Delivery   Birth date/time: 12/16/2022 02:18:20 Delivery type: Vaginal, Spontaneous        Postpartum Procedures: none Edinburgh:      03/08/2021    3:53 PM 12/17/2020   12:10 AM  Edinburgh Postnatal Depression Scale Screening Tool  I have been able to laugh and see the funny side of things. 0 0  I have looked forward with enjoyment to things. 0 0  I have blamed  myself unnecessarily when things went wrong. 1 1  I have been anxious or worried for no good reason. 1 1  I have felt scared or panicky for no good reason. 1 0  Things have been getting on top of me. 0 0  I have been so unhappy that I have had difficulty sleeping. 0 0  I have felt sad or miserable. 0 0  I have been so unhappy that I have been crying. 0 0  The thought of harming myself has occurred to me. 0 0  Edinburgh Postnatal Depression Scale Total 3 2      Post partum course:   Patient had an uncomplicated postpartum course.  By time of discharge on PPD#1, her pain was controlled on oral pain medications; she had appropriate lochia and was ambulating, voiding without difficulty and tolerating regular diet.  She was deemed stable for discharge to home.     Discharge Physical Exam:   BP (!) 95/51   Pulse 91   Temp 99 F (37.2 C) (Oral)   Resp 18   LMP 03/10/2022   SpO2 99%    General: NAD CV: RRR Pulm: CTABL, nl effort ABD: s/nd/nt, fundus firm and below the umbilicus Lochia: moderate Perineum: minimal edema/intact DVT Evaluation: LE non-ttp, no evidence of DVT on exam.   Last Labs       Hemoglobin  Date Value Ref Range Status  12/15/2022 10.1 (L) 12.0 - 15.0 g/dL Final  10/22/2022 10.3 (L) 11.1 - 15.9 g/dL Final  HCT  Date Value Ref Range Status  12/15/2022 29.9 (L) 36.0 - 46.0 % Final         Hematocrit  Date Value Ref Range Status  10/22/2022 30.8 (L) 34.0 - 46.6 % Final        Risk assessment for postpartum VTE and prophylactic treatment: Very high risk factors: None High risk factors: None Moderate risk factors: BMI 30-40 kg/m2   Postpartum VTE prophylaxis with LMWH not indicated   Disposition: stable, discharge to home. Baby Feeding: breast feeding Baby Disposition: home with mom   Rh Immune globulin indicated: No Rubella vaccine given: was not indicated Varivax vaccine given: was not indicated Flu vaccine given in AP setting: No Tdap  vaccine given in AP setting: Yes    Contraception: Mirena IUD   Prenatal Labs:    Blood type/Rh O   Antibody screen Negative    Rubella 2.45 (08/22 1613)   Varicella Immune  RPR Non Reactive (01/02 1117)   HBsAg Negative (08/22 1613)  Hep C NR   HIV Non Reactive (01/02 1117)   GC neg  Chlamydia neg  Genetic screening cfDNA negative   2 hour GTT 83,122,89  3 hour GTT N/a  GBS Negative/-- (02/14 1000)        Plan:  Loni Muse was discharged to home in good condition. Follow-up appointment with delivering provider in 6 weeks.    Allergies as of 12/17/2022       Reactions   Other Other (See Comments)   oranges Uncoded Allergy. Allergen: Shellfish, Other Reaction: Other reaction        Medication List     STOP taking these medications    cyclobenzaprine 10 MG tablet Commonly known as: FLEXERIL       TAKE these medications    acetaminophen 325 MG tablet Commonly known as: Tylenol Take 2 tablets (650 mg total) by mouth every 6 (six) hours as needed (for pain scale < 4).   benzocaine-Menthol 20-0.5 % Aero Commonly known as: DERMOPLAST Apply 1 Application topically as needed for irritation (perineal discomfort).   ferrous sulfate 325 (65 FE) MG tablet Commonly known as: FerrouSul Take 1 tablet (325 mg total) by mouth every other day.   ibuprofen 600 MG tablet Commonly known as: ADVIL Take 1 tablet (600 mg total) by mouth every 6 (six) hours.   multivitamin-prenatal 27-0.8 MG Tabs tablet Take 1 tablet by mouth daily at 12 noon.   pantoprazole 40 MG tablet Commonly known as: PROTONIX TAKE 1 TABLET BY MOUTH EVERY DAY   senna-docusate 8.6-50 MG tablet Commonly known as: Senokot-S Take 2 tablets by mouth at bedtime as needed for mild constipation.   sucralfate 1 GM/10ML suspension Commonly known as: CARAFATE Take by mouth.   witch hazel-glycerin pad Commonly known as: TUCKS Apply 1 Application topically as needed for hemorrhoids (for pain).          Signed: Japera Wahls, CNM 12/17/2022 9:15 AM

## 2022-12-17 NOTE — Progress Notes (Deleted)
Reviewed d/c instructions with parents and answered any questions.  ID bands checked, security device removed, infant discharged home with parents.

## 2022-12-17 NOTE — Progress Notes (Signed)
D/C order from MD.  Reviewed d/c instructions and prescriptions with patient and answered any questions.  Patient d/c home with infant via wheelchair by nursing/auxillary.

## 2022-12-17 NOTE — Lactation Note (Signed)
Lactation Consultation Note  Patient Name: Stefanie Sparks [id number] Date: 12/17/2022 Age:27 y.o. Reason for consult: Maternal discharge   Maternal Data G6P3 with maternal history of recent prenatal anemia and anxiety.  Has patient been taught Hand Expression?: Yes Does the patient have breastfeeding experience prior to this delivery?: Yes How long did the patient breastfeed?: Previously exclusively pumped for six weeks following delivery of sibling in 2022. Pt wishes to exclusively feed at breast for as long as possible. Pt reports no pain or concerns at this time  Feeding Mother's Current Feeding Choice: Breast Milk              Lactation Tools Discussed/Used Tools: Pump Breast pump type: Manual;Double-Electric Breast Pump Pump Education: Setup, frequency, and cleaning Reason for Pumping: Administered hand pump with 30m flange following fit assessment and observation. Visible colostrum easily removed with no pain reported. Pt reports 159mflanges at home and will utilize both sizes as breast tissue and nipple size are varied between both breasts. Familiar with operation and cleaning/storage of expressed breastmilk guidelines. Medela DEBP at home. Educated pt that each time any supplementation occurs, pumping should occur to compensate for infant milk removal. Interventions Interventions: Hand pump;Education;Ice;Breast feeding basics reviewed  Discharge Discharge Education: Engorgement and breast care;Outpatient recommendation Pump: Manual;DEBP  Consult Status   Complete. Pt referred to outpatient clinic if any concerns should arise. Update provided to care nurse.    AlRodena Medin/27/2024, 12:03 PM

## 2022-12-17 NOTE — Anesthesia Postprocedure Evaluation (Signed)
Anesthesia Post Note  Patient: Stefanie Sparks  Procedure(s) Performed: AN AD HOC LABOR EPIDURAL  Patient location during evaluation: Mother Baby Anesthesia Type: Epidural Level of consciousness: awake, awake and alert and oriented Pain management: pain level controlled Vital Signs Assessment: post-procedure vital signs reviewed and stable Respiratory status: spontaneous breathing and nonlabored ventilation Cardiovascular status: blood pressure returned to baseline and stable Postop Assessment: no headache and no backache Anesthetic complications: no  No notable events documented.   Last Vitals:  Vitals:   12/17/22 0000 12/17/22 0733  BP: (!) 104/56 (!) 100/55  Pulse: 74 66  Resp: 19 18  Temp: 36.7 C 36.7 C  SpO2: 99% 94%    Last Pain:  Vitals:   12/17/22 0733  TempSrc: Oral  PainSc:                  Johnna Acosta

## 2022-12-18 ENCOUNTER — Encounter: Payer: Medicaid Other | Admitting: Family Medicine

## 2022-12-25 ENCOUNTER — Encounter: Payer: Medicaid Other | Admitting: Family Medicine

## 2023-01-01 ENCOUNTER — Encounter: Payer: Medicaid Other | Admitting: Family Medicine
